# Patient Record
Sex: Female | Born: 1960 | Race: White | Hispanic: No | Marital: Married | Smoking: Never smoker
Health system: Southern US, Community
[De-identification: ages and names within clinical notes are randomized; demographics above are authoritative.]

## PROBLEM LIST (undated history)

## (undated) DIAGNOSIS — G4733 Obstructive sleep apnea (adult) (pediatric): Secondary | ICD-10-CM

## (undated) DIAGNOSIS — F319 Bipolar disorder, unspecified: Secondary | ICD-10-CM

## (undated) DIAGNOSIS — M797 Fibromyalgia: Secondary | ICD-10-CM

## (undated) DIAGNOSIS — T7840XA Allergy, unspecified, initial encounter: Secondary | ICD-10-CM

## (undated) DIAGNOSIS — M26629 Arthralgia of temporomandibular joint, unspecified side: Secondary | ICD-10-CM

## (undated) DIAGNOSIS — C4491 Basal cell carcinoma of skin, unspecified: Secondary | ICD-10-CM

## (undated) DIAGNOSIS — E2749 Other adrenocortical insufficiency: Secondary | ICD-10-CM

## (undated) DIAGNOSIS — M9420 Chondromalacia, unspecified site: Secondary | ICD-10-CM

## (undated) DIAGNOSIS — E039 Hypothyroidism, unspecified: Secondary | ICD-10-CM

## (undated) DIAGNOSIS — K760 Fatty (change of) liver, not elsewhere classified: Secondary | ICD-10-CM

## (undated) HISTORY — DX: Arthralgia of temporomandibular joint, unspecified side: M26.629

## (undated) HISTORY — DX: Basal cell carcinoma of skin, unspecified: C44.91

## (undated) HISTORY — DX: Other adrenocortical insufficiency: E27.49

## (undated) HISTORY — DX: Obstructive sleep apnea (adult) (pediatric): G47.33

## (undated) HISTORY — DX: Fatty (change of) liver, not elsewhere classified: K76.0

## (undated) HISTORY — PX: PLANTAR FASCIA RELEASE: SHX2239

## (undated) HISTORY — DX: Fibromyalgia: M79.7

## (undated) HISTORY — DX: Chondromalacia, unspecified site: M94.20

## (undated) HISTORY — DX: Bipolar disorder, unspecified: F31.9

## (undated) HISTORY — DX: Hypothyroidism, unspecified: E03.9

## (undated) HISTORY — DX: Allergy, unspecified, initial encounter: T78.40XA

---
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
## 1994-02-26 HISTORY — PX: HEMORRHOIDECTOMY WITH HEMORRHOID BANDING: SHX5633

## 1994-02-26 HISTORY — PX: TUBAL LIGATION: SHX77

## 1996-02-27 HISTORY — PX: ABDOMINAL HYSTERECTOMY: SHX81

## 1997-02-26 HISTORY — PX: OTHER SURGICAL HISTORY: SHX169

## 1997-02-26 HISTORY — PX: LIPOSUCTION: SHX10

## 1997-02-26 HISTORY — PX: REDUCTION MAMMAPLASTY: SUR839

## 1997-11-17 ENCOUNTER — Encounter: Admission: RE | Admit: 1997-11-17 | Discharge: 1997-11-17 | Payer: Self-pay | Admitting: *Deleted

## 1998-02-10 ENCOUNTER — Other Ambulatory Visit: Admission: RE | Admit: 1998-02-10 | Discharge: 1998-02-10 | Payer: Self-pay | Admitting: Obstetrics and Gynecology

## 1999-03-09 ENCOUNTER — Encounter: Admission: RE | Admit: 1999-03-09 | Discharge: 1999-03-09 | Payer: Self-pay | Admitting: *Deleted

## 1999-03-09 ENCOUNTER — Other Ambulatory Visit: Admission: RE | Admit: 1999-03-09 | Discharge: 1999-03-09 | Payer: Self-pay | Admitting: *Deleted

## 1999-03-09 ENCOUNTER — Encounter: Payer: Self-pay | Admitting: *Deleted

## 1999-04-03 ENCOUNTER — Encounter: Payer: Self-pay | Admitting: Pulmonary Disease

## 1999-06-16 ENCOUNTER — Encounter: Payer: Self-pay | Admitting: Pulmonary Disease

## 1999-11-09 ENCOUNTER — Ambulatory Visit (HOSPITAL_BASED_OUTPATIENT_CLINIC_OR_DEPARTMENT_OTHER): Admission: RE | Admit: 1999-11-09 | Discharge: 1999-11-09 | Payer: Self-pay | Admitting: Pulmonary Disease

## 2000-03-28 ENCOUNTER — Ambulatory Visit (HOSPITAL_BASED_OUTPATIENT_CLINIC_OR_DEPARTMENT_OTHER): Admission: RE | Admit: 2000-03-28 | Discharge: 2000-03-28 | Payer: Self-pay | Admitting: Pulmonary Disease

## 2000-08-15 ENCOUNTER — Other Ambulatory Visit: Admission: RE | Admit: 2000-08-15 | Discharge: 2000-08-15 | Payer: Self-pay | Admitting: *Deleted

## 2000-08-15 ENCOUNTER — Encounter: Admission: RE | Admit: 2000-08-15 | Discharge: 2000-08-15 | Payer: Self-pay | Admitting: *Deleted

## 2000-08-15 ENCOUNTER — Encounter: Payer: Self-pay | Admitting: *Deleted

## 2001-02-26 HISTORY — PX: OTHER SURGICAL HISTORY: SHX169

## 2001-10-07 ENCOUNTER — Encounter: Admission: RE | Admit: 2001-10-07 | Discharge: 2001-10-07 | Payer: Self-pay | Admitting: *Deleted

## 2001-10-07 ENCOUNTER — Encounter: Payer: Self-pay | Admitting: *Deleted

## 2002-02-02 ENCOUNTER — Other Ambulatory Visit: Admission: RE | Admit: 2002-02-02 | Discharge: 2002-02-02 | Payer: Self-pay | Admitting: Obstetrics and Gynecology

## 2002-06-26 ENCOUNTER — Inpatient Hospital Stay (HOSPITAL_COMMUNITY): Admission: RE | Admit: 2002-06-26 | Discharge: 2002-06-30 | Payer: Self-pay | Admitting: *Deleted

## 2002-06-26 ENCOUNTER — Encounter (INDEPENDENT_AMBULATORY_CARE_PROVIDER_SITE_OTHER): Payer: Self-pay | Admitting: *Deleted

## 2002-08-03 ENCOUNTER — Ambulatory Visit (HOSPITAL_COMMUNITY): Admission: RE | Admit: 2002-08-03 | Discharge: 2002-08-03 | Payer: Self-pay | Admitting: *Deleted

## 2002-10-28 ENCOUNTER — Encounter: Admission: RE | Admit: 2002-10-28 | Discharge: 2002-10-28 | Payer: Self-pay | Admitting: *Deleted

## 2004-08-16 ENCOUNTER — Ambulatory Visit: Payer: Self-pay | Admitting: *Deleted

## 2004-11-07 ENCOUNTER — Ambulatory Visit: Payer: Self-pay | Admitting: Pulmonary Disease

## 2005-08-28 ENCOUNTER — Ambulatory Visit: Payer: Self-pay | Admitting: *Deleted

## 2005-11-23 ENCOUNTER — Ambulatory Visit: Payer: Self-pay | Admitting: Pulmonary Disease

## 2006-01-24 ENCOUNTER — Ambulatory Visit: Payer: Self-pay | Admitting: Unknown Physician Specialty

## 2006-10-02 ENCOUNTER — Ambulatory Visit: Payer: Self-pay | Admitting: *Deleted

## 2006-10-22 ENCOUNTER — Ambulatory Visit: Payer: Self-pay | Admitting: Pulmonary Disease

## 2006-10-31 DIAGNOSIS — IMO0001 Reserved for inherently not codable concepts without codable children: Secondary | ICD-10-CM | POA: Insufficient documentation

## 2006-10-31 DIAGNOSIS — K7689 Other specified diseases of liver: Secondary | ICD-10-CM | POA: Insufficient documentation

## 2006-10-31 DIAGNOSIS — G4710 Hypersomnia, unspecified: Secondary | ICD-10-CM | POA: Insufficient documentation

## 2006-10-31 DIAGNOSIS — M9420 Chondromalacia, unspecified site: Secondary | ICD-10-CM | POA: Insufficient documentation

## 2007-03-30 DIAGNOSIS — D239 Other benign neoplasm of skin, unspecified: Secondary | ICD-10-CM

## 2007-03-30 HISTORY — DX: Other benign neoplasm of skin, unspecified: D23.9

## 2007-08-12 ENCOUNTER — Emergency Department: Payer: Self-pay | Admitting: Emergency Medicine

## 2007-11-20 ENCOUNTER — Ambulatory Visit: Payer: Self-pay | Admitting: Obstetrics and Gynecology

## 2008-03-25 ENCOUNTER — Ambulatory Visit: Payer: Self-pay | Admitting: Pulmonary Disease

## 2008-03-25 DIAGNOSIS — G4733 Obstructive sleep apnea (adult) (pediatric): Secondary | ICD-10-CM | POA: Insufficient documentation

## 2008-05-03 ENCOUNTER — Ambulatory Visit: Payer: Self-pay | Admitting: Pulmonary Disease

## 2008-05-03 DIAGNOSIS — R05 Cough: Secondary | ICD-10-CM

## 2008-05-03 DIAGNOSIS — R059 Cough, unspecified: Secondary | ICD-10-CM | POA: Insufficient documentation

## 2008-05-03 DIAGNOSIS — R062 Wheezing: Secondary | ICD-10-CM | POA: Insufficient documentation

## 2008-05-24 ENCOUNTER — Ambulatory Visit (HOSPITAL_COMMUNITY): Admission: RE | Admit: 2008-05-24 | Discharge: 2008-05-24 | Payer: Self-pay | Admitting: Pulmonary Disease

## 2008-05-24 ENCOUNTER — Encounter: Payer: Self-pay | Admitting: Pulmonary Disease

## 2009-04-11 ENCOUNTER — Ambulatory Visit: Payer: Self-pay | Admitting: Pulmonary Disease

## 2009-07-20 ENCOUNTER — Telehealth: Payer: Self-pay | Admitting: Pulmonary Disease

## 2009-07-21 ENCOUNTER — Ambulatory Visit: Payer: Self-pay | Admitting: Obstetrics and Gynecology

## 2010-03-30 NOTE — Progress Notes (Signed)
Summary: cpap  Phone Note Call from Patient   Caller: Katherine James Call For: Katherine James Summary of Call: her ramp button takes to get from 5-10 needs order to [facility] to unhook her ramp if this is ok Initial call taken by: Oneita Jolly,  Jul 20, 2009 12:00 PM  Follow-up for Phone Call        she can turn it off.  Doesn't have to keep it on.  She can read the book, or we can have dme if she wants [place] to. Follow-up by: Barbaraann Share MD,  Jul 20, 2009 5:18 PM  Additional Follow-up for Phone Call Additional follow up Details #1::        will send order to [facility] to show pt how to trun off ramp Additional Follow-up by: Oneita Jolly,  Jul 21, 2009 8:49 AM

## 2010-03-30 NOTE — Assessment & Plan Note (Signed)
Summary: rov for osa   Copy to:  [facility]  CC:  Pt is here for a 52yr f/u appt.  Pt states she is wearing her cpap machine every night.  Approx 12 hours per night.  Pt denied any complaints with mask or pressure. Marland Kitchen  History of Present Illness: The pt comes in today for f/u of her known osa.  She is wearing cpap compliantly, and is having no issues with mask fit or pressure.  She is sleeping well, and is satisfied with her daytime alertness.  She is also losing weight slowly over time with lifestyle change.  Current Medications (verified): 1)  Ambien 10 Mg Tabs (Zolpidem Tartrate) .... Take 1 Tab By Mouth At Bedtime 2)  Lamictal 200 Mg  Tabs (Lamotrigine) .Marland Kitchen.. 1 1/2 Tab Once Daily 3)  Duragesic-12 12.5 Mcg/hr  Pt72 (Fentanyl) .... Apply Every 3rd  Day 4)  Guafenisin 300 Mg Tabs .... 5 Tabs A Day 5)  Lithobid 300 Mg Cr-Tabs (Lithium Carbonate) .Marland Kitchen.. 1 1/2 Tabs Every Day 6)  Dhea 15-50 Mg Caps (Nutritional Supplements) .... Take 1 Tablet By Mouth Once A Day 7)  Hormonal Cream .... Once Daily 8)  Natues Thyroid 164 .... Take 1 Tablet By Mouth Once A Day 9)  Progesterone 50 Mg (Progesterone) .... Take 1 Tab By Mouth At Bedtime 10)  Melatonin 1 Mg Tabs (Melatonin) .... 1/2 Tab By Mouth At Bedtime 11)  Tylenol Extra Strength 500 Mg Tabs (Acetaminophen) .... Take 1 Tablet By Mouth Four Times A Day As Needed 12)  Roxicodone 5 Mg Tabs (Oxycodone Hcl) .... Take 1/4 Tab By Mouth One To Two Times Daily As Needed 13)  Valium 10 Mg Tabs (Diazepam) .... Take 1/4 Tab By Mouth One To Two Times Daily As Needed  Allergies (verified): 1)  ! * Enteric Coated Asa 2)  Codeine 3)  Tetracycline 4)  Bactrim 5)  Ibuprofen 6)  Naprosyn 7)  Relafen 8)  Clinoril 9)  Voltaren 10)  Toradol 11)  Feldene 12)  Lodine 13)  Seldane 14)  Prozac 15)  Vioxx 16)  Celebrex 17)  Daypro 18)  Doxycycline 19)  Augmentin 20)  * Flonase 21)  Ultram 22)  Xanax 23)  Motrin 24)  Paxil 25)  Thorazine 26)   Levaquin 27)  Demerol  Review of Systems      See HPI  Vital Signs:  Patient profile:   50 year old female Height:      68 inches Weight:      202.25 pounds BMI:     30.86 BP sitting:   120 / 76  (left arm) Cuff size:   regular  Vitals Entered By: Arman Filter LPN (April 11, 2009 1:32 PM)  O2 Flow:  Room air CC: Pt is here for a 45yr f/u appt.  Pt states she is wearing her cpap machine every night.  Approx 12 hours per night.  Pt denied any complaints with mask or pressure.  Comments Medications reviewed with patient Arman Filter LPN  April 11, 2009 1:32 PM    Physical Exam  General:  ow female in nad Nose:  no skin breakdown or pressure necrosis from the cpap mask.   Impression & Recommendations:  Problem # 1:  OBSTRUCTIVE SLEEP APNEA (ICD-327.23)  the pt is doing very well on cpap, and has lost 30 pounds over the last one year.  I have asked her to keep up with cpap supplies, and to continue to work on [employer]  loss.  She will followup again in one year.  Medications Added to Medication List This Visit: 1)  Ambien 10 Mg Tabs (Zolpidem tartrate) .... Take 1 tab by mouth at bedtime 2)  Duragesic-12 12.5 Mcg/hr Pt72 (Fentanyl) .... Apply every 3rd  day 3)  Dhea 15-50 Mg Caps (Nutritional supplements) .... Take 1 tablet by mouth once a day 4)  Hormonal Cream  .... Once daily 5)  Natues Thyroid 164  .... Take 1 tablet by mouth once a day 6)  Progesterone 50 Mg (progesterone)  .... Take 1 tab by mouth at bedtime 7)  Melatonin 1 Mg Tabs (Melatonin) .... 1/2 tab by mouth at bedtime 8)  Tylenol Extra Strength 500 Mg Tabs (Acetaminophen) .... Take 1 tablet by mouth four times a day as needed 9)  Roxicodone 5 Mg Tabs (Oxycodone hcl) .... Take 1/4 tab by mouth one to two times daily as needed 10)  Valium 10 Mg Tabs (Diazepam) .... Take 1/4 tab by mouth one to two times daily as needed  Other Orders: Est. Patient Level II (16109)  Patient Instructions: 1)  Please  schedule a follow-up appointment in 1 year. 2)  continue to work on weight loss   Immunization History:  Influenza Immunization History:    Influenza:  historical (02/27/1996)  Pneumovax Immunization History:    Pneumovax:  historical (02/27/1996)

## 2010-05-09 ENCOUNTER — Ambulatory Visit (INDEPENDENT_AMBULATORY_CARE_PROVIDER_SITE_OTHER): Payer: Medicare Other | Admitting: Pulmonary Disease

## 2010-05-09 ENCOUNTER — Encounter: Payer: Self-pay | Admitting: Pulmonary Disease

## 2010-05-09 DIAGNOSIS — G4733 Obstructive sleep apnea (adult) (pediatric): Secondary | ICD-10-CM

## 2010-05-25 NOTE — Assessment & Plan Note (Signed)
Summary: rov for osa    Copy to:  [facility]  CC:  1 year f/u appt for OSA.  Pt states she wears her cpap machine every night.  "a little less" than 12 hours per night.  Pt states mask causes sore spot on bridge of nose.  Denies any complaints with pressure.  Marland Kitchen  History of Present Illness: the pt comes in today for f/u of her known osa.  She is wearing cpap compliantly, but is overdue for a new mask.  She feels that she is sleeping well, and denies any issues with daytime sleepiness.  She states that she is losing weight, but refuses to weigh today.    Current Medications (verified): 1)  Seroquel 25 Mg Tabs (Quetiapine Fumarate) .... Take 1 Tab By Mouth At Bedtime 2)  Lamictal 200 Mg  Tabs (Lamotrigine) .Marland Kitchen.. 1 1/2 Tab Once Daily 3)  Duragesic-25 25 Mcg/hr Pt72 (Fentanyl) .... Every 3rd Day 4)  Guafenisin 300 Mg Tabs .Marland Kitchen.. 7 Tabs A Day 5)  Lithobid 300 Mg Cr-Tabs (Lithium Carbonate) .Marland Kitchen.. 1 1/2 Tabs Every Day 6)  Dhea 20 2.5mg  .... Take 1 Tablet By Mouth Once A Day 7)  Hormonal Cream .... Once Daily 8)  Natues Thyroid 97.5 Mcg .... Take 1 Tablet By Mouth Once A Day 9)  Progesterone 50 Mg (Progesterone) .... Take 1 Tab By Mouth At Bedtime 10)  Melatonin 1 Mg Tabs (Melatonin) .... Take 1 Tablet By Mouth Once A Day 11)  Roxicodone 5 Mg Tabs (Oxycodone Hcl) .... Take 1/4 Tab By Mouth One To Two Times Daily As Needed 12)  Flexeril 10 Mg Tabs (Cyclobenzaprine Hcl) .... 1/4 Tab By Mouth As Needed  Allergies: 1)  ! * Enteric Coated Asa 2)  ! * Testerone Cream 3)  Codeine 4)  Tetracycline 5)  Bactrim 6)  Ibuprofen 7)  Naprosyn 8)  Relafen 9)  Clinoril 10)  Voltaren 11)  Toradol 12)  Feldene 13)  Lodine 14)  Seldane 15)  Prozac 16)  Vioxx 17)  Celebrex 18)  Daypro 19)  Doxycycline 20)  Augmentin 21)  * Flonase 22)  Ultram 23)  Xanax 24)  Motrin 25)  Paxil 26)  Thorazine 27)  Levaquin 28)  Demerol  Review of Systems       The patient complains of weight change,  tooth/dental problems, nasal congestion/difficulty breathing through nose, anxiety, depression, hand/feet swelling, and joint stiffness or pain.  The patient denies shortness of breath with activity, shortness of breath at rest, productive cough, non-productive cough, coughing up blood, chest pain, irregular heartbeats, acid heartburn, indigestion, loss of appetite, abdominal pain, difficulty swallowing, sore throat, headaches, sneezing, itching, ear ache, rash, change in color of mucus, and fever.    Vital Signs:  Patient profile:   50 year old female O2 Sat:      97 % on Room air Temp:     98.2 degrees F oral Pulse rate:   67 / minute BP sitting:   112 / 70  (left arm) Cuff size:   regular  Vitals Entered By: Arman Filter LPN (May 09, 2010 1:35 PM)  O2 Flow:  Room air CC: 1 year f/u appt for OSA.  Pt states she wears her cpap machine every night.  "a little less" than 12 hours per night.  Pt states mask causes sore spot on bridge of nose.  Denies any complaints with pressure.   Comments pt refuses to weigh.  Medications reviewed with patient Arman Filter  LPN  May 09, 2010 1:40 PM    Physical Exam  General:  ow female in nad Nose:  mild erythema over bridge over nose, but no skin breakdown or pressure necrosis  Extremities:  no edema or cyanosis  Neurologic:  alert and oriented, moves all 4 .    Impression & Recommendations:  Problem # 1:  OBSTRUCTIVE SLEEP APNEA (ICD-327.23) the pt is doing well with cpap, and reports no issues with pressure.  She is getting some mask irritation from an old mask, and will go ahead and replace asap.  She states that she has lost 40 pounds since last visit, but refuses to weigh today.  She feels she is doing well wrt her sleep apnea and daytime alertness.   Medications Added to Medication List This Visit: 1)  Seroquel 25 Mg Tabs (Quetiapine fumarate) .... Take 1 tab by mouth at bedtime 2)  Duragesic-25 25 Mcg/hr Pt72 (Fentanyl) .... Every  3rd day 3)  Guafenisin 300 Mg Tabs  .Marland Kitchen.. 7 tabs a day 4)  Dhea 20 2.5mg   .... Take 1 tablet by mouth once a day 5)  Natues Thyroid 97.5 Mcg  .... Take 1 tablet by mouth once a day 6)  Melatonin 1 Mg Tabs (Melatonin) .... Take 1 tablet by mouth once a day 7)  Flexeril 10 Mg Tabs (Cyclobenzaprine hcl) .... 1/4 tab by mouth as needed  Other Orders: Est. Patient Level III (81191) DME Referral (DME)  Patient Instructions: 1)  will send an order to [facility] for a new mask 2)  continue to work on weight loss, and let me know when you get to your goal weight. 3)  followup with me in one year.

## 2010-06-10 ENCOUNTER — Ambulatory Visit: Payer: Self-pay | Admitting: Internal Medicine

## 2010-07-14 NOTE — Discharge Summary (Signed)
Katherine James, Katherine James                          ACCOUNT NO.:  [id number]   MEDICAL RECORD NO.:  [id number]                   PATIENT TYPE:  INP   LOCATION:  0352                                 FACILITY:  [facility]   PHYSICIAN:  Pershing Cox, M.D.            DATE OF BIRTH:  February 19, 1961   DATE OF ADMISSION:  06/26/2002  DATE OF DISCHARGE:  06/30/2002                                 DISCHARGE SUMMARY   ADMITTING DIAGNOSES:  Right ovarian mass and pelvic pain.   DISCHARGE DIAGNOSES:  1. Right ovarian torsion with rupture.  2. Pelvic phlegmon.  3. Marked postoperative complication with pain.   HISTORY OF PRESENT ILLNESS:  For details of the patient's admission history  and physical, please see the transcribed note which is in the patient's  chart dated June 26, 2002.  Briefly, the patient is 50 years old and  presented to emergency room in [place] with severe right lower quadrant  pain on April 23.  CT scan showed a complex mass.  In our office ultrasound  showed a complex mass measuring 10 x 7 x 9 cm.  This appeared to be from the  right ovary.  Blood flow to this ovary appeared to be normal.  Tumor markers  were negative.  The patient was prepped for surgery and brought to the  operating room for this admission.   HOSPITAL COURSE:  On the day of surgery the patient was taken to the  operating room and under general anesthesia with epidural local the patient  underwent examination under anesthesia, lysis of adhesions, and bilateral  salpingo-oophorectomy.  She had previously had a supracervical hysterectomy.  We found a necrotic right fallopian tube and ovary with evidence of torsion.  There was a marked pelvic phlegmon with exudate covering the left fallopian  tube and ovary which was the reason for removal of this ovary.   The patient's operative procedure was uncomplicated.  She was admitted to  the intensive care unit after surgery for pain management.  It should be  noted  that this patient has severe chronic pain syndrome which appears to be  associated with her diagnosis of fibromyalgia.  She has an epidural patch 75  mg that she wears constantly.  In the preoperative period it was very  difficult to control her pain and because of her need for epidural local  anesthesia, we elected to place her into the intensive care unit.   The decision for intensive care management was a good one.  Her pain was  extremely difficult to control.  In the recovery room they were unable to  distract her and help her to manage her pain despite the documentation that  the local anesthesia was working well.  She was treated with a Dilaudid PCA  and still had very poor control.  I asked for consultation by physical and  rehabilitative medicine and Erick Colace, M.D. saw  her in  consultation.  He suggested that we take out her Foley as this seemed to be  the major concern that she had and treat her with Valium.  He recommended  that she not receive additional Duragesic patch medication.  Later that  evening even after her Foley was removed she was rocking in her bed crying  and writhing in pain.  Lidocaine jelly was applied to her urethra.  A very  small pediatric Foley was inserted and when the patient was distracted her  pain seemed to be much better controlled.  There was some evidence noted by  this physician and by the patient's nursing staff that her pain was  extremely exacerbated in the presence of her family and her husband.  Nevertheless, the patient continued to be a major problem in terms of pain  management.  Consultation with the pharmacist led [place] to begin her on a  Dilaudid drip.  The patient's Dilaudid drip and her epidural local  anesthesia kept her in the intensive care unit over the first 48 hours.   On postoperative day number one patient's major complaint was that her right  leg was numb and it was difficult for her to stand.  Despite this we kept   the epidural in until the following morning because we were very concerned  about her inability to control her pain without it.  That day she had a good  day, was able to tolerate sips of liquid without nausea, and was able to  manage her pain with a Dilaudid drip receiving 3.5 mg/hour of this  medication.  Remember that this is in combination with the lidocaine  epidural and with her 75 mg Duragesic patch.  The evening of May 1 she was  also noted to have brief episode of asystole.  I discussed this with the  cardiologist on-call and because it was brief lasting only 10.5 seconds, he  did not feel that it needed to be worked up.  She had a couple more episodes  of this.  All of these episodes were asymptomatic.   On the morning of postoperative day number two the patient had no nausea and  had passed flatus.  Tmax was 99.1.  Her physical examination was stable.  Her hemoglobin was also stable.  She continued to have low potassium levels  and received potassium runs over that day.  She was started on a regular  diet.  Her Foley catheter was removed.  She had received hydrocortisone  because of a diagnosis of some sort of adrenal insufficiency.  This was  continued on postoperative day number two and she was able to resume her own  adrenal extract which was prescribed by Dr. Elesa Massed in [place].   Postoperative day number three the patient was tolerating a regular diet  without nausea.  She had some abdominal pain but this seemed to be  adequately controlled on Tylox.  If she took two Tylox she was really too  sleepy.  She complained of stabbing pain in her urethra.  Urine was obtained  for UA and C&S.  The UA was normal.  She was offered lidocaine for her  urethra which she declined.  At this time I insisted that she begin on  Percocet one, not two q.4h. because she was very, very sleepy.  She seemed  to tolerate this well.  On postoperative day number four the patient was examined and  was very  sleepy and irritable.  She told  me she is not a morning person.  In  reviewing her data her vital signs were stable.  She has been walking in the  hall.  She has been taking a regular diet.  Her potassium now is 3.7.  Our  plan was to discharge her this morning.  She will be seen in my office on  Friday or Monday to have her staples removed.  Will check her potassium at  that time.  She is given a prescription for Tylox number 50 one p.o. q.4h.   Concerning this patient's ongoing pain syndrome, we have discussed some  options for her and in the postoperative period we will either ask her to be  seen by the rehabilitation medicine people here for help with her pain or  possibly with some biofeedback technology.                                               Pershing Cox, M.D.    MAJ/MEDQ  D:  06/30/2002  T:  06/30/2002  Job:  [id number]

## 2010-07-14 NOTE — Op Note (Signed)
Katherine James, Katherine James                          ACCOUNT NO.:  [id number]   MEDICAL RECORD NO.:  [id number]                   PATIENT TYPE:  INP   LOCATION:  0002                                 FACILITY:  [facility]   PHYSICIAN:  Pershing Cox, M.D.            DATE OF BIRTH:  August 29, 1960   DATE OF PROCEDURE:  06/26/2002  DATE OF DISCHARGE:                                 OPERATIVE REPORT   PREOPERATIVE DIAGNOSES:  Right complex ovarian mass and pelvic pain.   POSTOPERATIVE DIAGNOSES:  Torsion of the right fallopian tube and ovary,  with cyst rupture and pelvic phlegmon.   PROCEDURES:  1. Examination under anesthesia.  2. Lysis of adhesions.  3. Bilateral salpingo-oophorectomy.   ANESTHESIA:  General endotracheal and epidural for local anesthesia  postoperatively.   SURGEON:  Pershing Cox, M.D.   ASSISTANTS:  Lenoard Aden, M.D.  Debbora Dus, C.N.P.   INDICATIONS FOR PROCEDURE:  The patient is 50 years old.  She has a complex  medical history, which includes severe pain syndrome associated with her  fibromyalgia, idiopathic hypersomnia and sleep apnea, migraine headaches.  The patient was well until Friday, June 19, 2002.  She was taken by  ambulance to [facility] for severe and sudden pelvic  pain.  A CT scan showed a complex adnexal mass.  Sonogram was recommended,  and the patient came to our office on Monday morning and had a sonogram.  This showed a complex right adnexal mass.  There was no evidence of acute  abdomen at that time, although her white count was elevated.  The patient  refused hospitalization at that time and did not want to consider surgery.  She was seen the second day, and at that time was counseled for surgery and  did agree.  She was scheduled for surgery this morning.   OPERATIVE FINDINGS:  Exploratory laparotomy was performed.  There was no  evidence of ascites.  There was a complex pelvic phlegmon, carved by a right  ovarian torsion.  The ovarian mass was stuck to the undersurface of the  rectosigmoid, and softly to the cecum, appendix, right pelvic sidewall, left  fallopian tube and ovary, and cul-de-sac.  The surfaces had a large exudate.  Approximately 90% of the exudate was easily removed and submitted with a  specimen.  The ovary and fallopian tube were black.  They could clearly be  seen to be twisted on the blood supply.  The left fallopian tube and ovary  were also covered with exudate.  The left fallopian tube was distended and  swollen and dark.  An attempt was made to remove the fallopian tube, but  this was unsuccessful because it was impossible to get hemostasis from the  mesosalpinx.  Therefore, the left ovary was also removed.   DESCRIPTION OF PROCEDURE:  The patient was brought to the operating room  with an IV in  place.  She was in the holding area and an epidural catheter  had been placed.  She had received 100 mg of hydrocortisone and 1 mg of  Ancef.  Supine on the OR table, IV sedation was administered.  She was then  placed into a frog-leg position and the anterior abdominal wall, perineum  and vagina were prepped with solution of Hibiclens.  A Foley catheter was  gently inserted into the bladder.  Again, in the supine position, the  abdomen was marked for a midline incision; the abdomen had been draped for a  midline incision using sterile drapes.  Marcaine was instilled into the  surface of the skin, beginning approximately 4 cm beneath the umbilicus and  extending to the symphysis.  The Marcaine was placed around the umbilicus,  so that we would know where to go if we found a malignancy.  This incision  was never used during this case.   A scalpel was used to incise the skin, taking this down to the Scarpa's  fascia.  This was divided and the remainder of the dissection was by blunt  dissection, excluding the fascia, which was opened with a knife and exposing  the underlying  rectosigmoid muscles.  These were divided in their midline.  The peritoneum was entered and opened atraumatically.  The suction catheter  was then inserted using a _______  suction, and very little fluid was  extruded.  Peritoneal incision was extended superiorly and inferiorly, and  the anterior abdomen walls were lifted with retractors.  Saline at 500 cc  was instilled and retrieved with peritoneal washings.   The oval retractor was used to retract the abdominal walls.  Using blunt  resection, the ovary was separated from the rectosigmoid and at that point I  was able to remove the other bowel surfaces along the surface of the upper  rectosigmoid.  The ovarian mass could then be lifted and separated from the  rectosigmoid.  At this point it was possible to pack the bowel into the  upper abdomen.   The round ligament was isolated on the patient's right.  It was suture-  ligated and transected going from the anterior into the retroperitoneum.  The peritoneum along the ovarian vessels was incised, and the IP ligament  was lifted medially.  The ureter was visualized and the IC ligament was  doubly clamped, cut, sutured and free-tie ligated.  The posterior peritoneum  was lifted.  There was such an exudate and so much inflammation that just  lifting it caused the ovary to be freed.  The ovary was freed down to its  attachment in the pelvis, which was to a long adhesion coming off the pelvic  sidewall.  This was probably the reason for the patient's torsion.  The  ovary was liver-like in quality.  It was sent to the pathologist and frozen  section showed no evidence of malignancy -- simply torsion.  In inspecting  the remainder of the pelvis, there was marked exudate in the pelvis covering  the cul-de-sac and extending to the patient's left.  The ovary on the left  was involved in this exudate, as was the fallopian tube.  As the exudate was removed, it was clear that the fallopian tube was  markedly distended and  very dark.  A decision was made to try and remove the fallopian tube.  It  was lifted along the mesosalpinx and right-angled clamps were used to try  and separate it from the  ovary.  This was unsuccessful, as each of the ties  that were placed caused more and more bleeding.  A decision was then made to  proceed with a left salpingo-oophorectomy.  The round ligament was  identified, suture ligated and transected.  The peritoneum was lifted.  The  IC ligament was isolated; ureter was palpated.  The IP ligament was doubly  clamped, cut, sutured and free-tie ligated.  The tissue along the base of  the fallopian tube and ovary was so involved that it literally disintegrated  as we went through the fallopian tube and ovary.   The remainder of the pelvis was explored.  Where there was necrotic tissue  it was removed.  The pelvis was vigorously irrigated.  The patient was  positioned to Trendelenburg, so that the fluid from the upper abdomen would  come down.   Moist laps were removed from the upper abdomen where the bowel had been  packed into the upper abdomen.  The surfaces of the transverse colon were  inspected where the ovary had been adherent; there was no evidence of  bleeding.  Omentum was pulled down over the top of the rectosigmoid.  The  fascia was closed with a running stitch of double-stranded Prolene, starting  at the top and the bottom and running the stitch.  The stitch was buried  after tying the two strands together.  The center was looped, and therefore  there were no knots at either end.  The knotted suture was buried in the  fascia, and then the subcutaneous tissues were closed over the knot using a  3-0 Vicryl.  Skin staples were applied.   PATHOLOGY:  The specimens included right and left fallopian tubes and ovary,  portions of peritoneum and phlegmon and exudate.   COMPLICATIONS:  None.   ESTIMATED BLOOD LOSS:  500 cc.   URINE OUTPUT:  450  cc.   FLUIDS:  179 cc Crystalloid.                                                  Pershing Cox, M.D.    MAJ/MEDQ  D:  06/26/2002  T:  06/26/2002  Job:  [id number]   cc:   Lenoard Aden, M.D.  [address], [address]  [place]  [place] [address]  Fax: [phone]

## 2010-07-19 ENCOUNTER — Telehealth: Payer: Self-pay | Admitting: Pulmonary Disease

## 2010-07-19 NOTE — Telephone Encounter (Signed)
Called and spoke with pt.  Pt states her current machine is 49 years old.  Pt is aware of the 1 year warrenty and still wishes to go ahead with ordering a cpap machine online.  Pt requests this rx be faxed to her personal fax # at [phone].  ( no need for a cover sheet) also needs a dx code on the rx.

## 2010-07-19 NOTE — Telephone Encounter (Signed)
Spoke with pt. She states that she was using [employer] for CPAP supplies, but now since ins has changed [employer] not in her network.  She is requesting rx for CPAP so that she can get this online. She states rx needs to state dx also.  Pls advise thanks

## 2010-07-19 NOTE — Telephone Encounter (Signed)
She needs to make sure her machine is at least 49 yrs old.  Insurance will not cover if less than that.  If it is old enough, I can give her a prescription for cpap online.  Make sure she understands though it will only have a one year warranty, and if breaks, becomes a "doorstop"  If she gets thru dme, insurance will usually pay them for maintenance contract if something happens to it.

## 2010-07-19 NOTE — Telephone Encounter (Signed)
done

## 2010-09-14 ENCOUNTER — Telehealth: Payer: Self-pay | Admitting: Pulmonary Disease

## 2010-09-14 NOTE — Telephone Encounter (Signed)
Pt says she is no longer using [employer] as her DME and has found a CPAP mask on-line that she wishes to try.  The website is [email or web address]. We will need to fax a prescription for the CPAP mask and supplies if KC agrees with this. Pls advise.

## 2010-09-14 NOTE — Telephone Encounter (Signed)
PT CALLED BACK. SAYS SHE LEFT AN INCORRECT FAX # FOR "[email or web address]". FAX # IS [phone]. Katherine James

## 2010-09-15 NOTE — Telephone Encounter (Signed)
Pt aware we will fax rx.Jennifer Yancey Flemings, CMA

## 2010-09-15 NOTE — Telephone Encounter (Signed)
Ok with me 

## 2010-09-26 ENCOUNTER — Ambulatory Visit: Payer: Self-pay

## 2010-10-09 ENCOUNTER — Telehealth: Payer: Self-pay | Admitting: *Deleted

## 2010-10-09 NOTE — Telephone Encounter (Signed)
LMOM to inform pt we are still processing order for her request regarding rx fax to [facility], which need her current pressure setting.  Called and spoke with Christoper Allegra (who was her last DME company she used and they have on filed she is currently set at 10cm) Just need to verify with pt the correct setting of her cpap and then we can process this for pt.

## 2010-10-09 NOTE — Telephone Encounter (Signed)
Pt returned [name] call.  Please call back.  Katherine James

## 2010-10-10 NOTE — Telephone Encounter (Signed)
Returning call.  Please call [phone]

## 2010-10-10 NOTE — Telephone Encounter (Signed)
LMOMTCB x 1 

## 2010-10-16 NOTE — Telephone Encounter (Signed)
Pt called back.  Stated she is on a pressure of 10cm on her cpap.  Will inform KC of this so he can sign her rx so we can fax to [facility].

## 2010-10-16 NOTE — Telephone Encounter (Signed)
LMOMTCB

## 2011-03-12 ENCOUNTER — Telehealth: Payer: Self-pay | Admitting: Pulmonary Disease

## 2011-03-12 DIAGNOSIS — G4733 Obstructive sleep apnea (adult) (pediatric): Secondary | ICD-10-CM

## 2011-03-12 NOTE — Telephone Encounter (Signed)
Patient last seen 05-09-2010 and told to follow up in 1 year; [facility] are you okay with giving order to [facility] for CPAP supplies as well as sleep study? Thanks.

## 2011-03-13 NOTE — Telephone Encounter (Signed)
Sleep study faxed to [facility]

## 2011-03-13 NOTE — Telephone Encounter (Signed)
Patient returning call.

## 2011-03-13 NOTE — Telephone Encounter (Signed)
Ok to send script for mask, but she needs to keep appt with me upcoming.

## 2011-03-13 NOTE — Telephone Encounter (Signed)
Order has been placed--LMOMTCB for pt to make aware she needs to keep pending apt

## 2011-03-13 NOTE — Telephone Encounter (Signed)
I spoke with pt and is aware of KC recs. Nothing further was needed. Will also forward to [facility] that per pt [facility] in [place] advised her that they need her sleep study as well. Please advise PCC, thanks

## 2011-04-14 ENCOUNTER — Emergency Department: Payer: Self-pay | Admitting: Emergency Medicine

## 2011-04-14 LAB — URINALYSIS, COMPLETE
Bacteria: NONE SEEN
Bilirubin,UR: NEGATIVE
Blood: NEGATIVE
Glucose,UR: NEGATIVE mg/dL (ref 0–75)
Ketone: NEGATIVE
Nitrite: NEGATIVE
Protein: NEGATIVE
RBC,UR: <1 /HPF (ref 0–5)
Squamous Epithelial: <1

## 2011-04-14 LAB — CBC
HCT: 36.3 % (ref 35.0–47.0)
HGB: 12.3 g/dL (ref 12.0–16.0)
MCHC: 33.9 g/dL (ref 32.0–36.0)
MCV: 94 fL (ref 80–100)
Platelet: 227 10*3/uL (ref 150–440)
RDW: 13 % (ref 11.5–14.5)
WBC: 9.2 10*3/uL (ref 3.6–11.0)

## 2011-04-14 LAB — COMPREHENSIVE METABOLIC PANEL
Albumin: 4.6 g/dL (ref 3.4–5.0)
Alkaline Phosphatase: 90 U/L (ref 50–136)
Anion Gap: 8 (ref 7–16)
Calcium, Total: 9 mg/dL (ref 8.5–10.1)
SGOT(AST): 18 U/L (ref 15–37)
SGPT (ALT): 23 U/L

## 2011-04-14 LAB — LIPASE, BLOOD: Lipase: 92 U/L (ref 73–393)

## 2011-04-27 ENCOUNTER — Ambulatory Visit: Payer: Self-pay | Admitting: Family Medicine

## 2011-05-18 ENCOUNTER — Ambulatory Visit: Payer: Medicare Other | Admitting: Pulmonary Disease

## 2011-06-08 ENCOUNTER — Encounter: Payer: Self-pay | Admitting: Pulmonary Disease

## 2011-06-11 ENCOUNTER — Encounter: Payer: Self-pay | Admitting: Pulmonary Disease

## 2011-06-11 ENCOUNTER — Ambulatory Visit (INDEPENDENT_AMBULATORY_CARE_PROVIDER_SITE_OTHER): Payer: Medicare Other | Admitting: Pulmonary Disease

## 2011-06-11 VITALS — BP 120/74 | HR 68 | Temp 98.4°F | Ht 69.0 in | Wt 199.0 lb

## 2011-06-11 DIAGNOSIS — G4733 Obstructive sleep apnea (adult) (pediatric): Secondary | ICD-10-CM

## 2011-06-11 NOTE — Assessment & Plan Note (Signed)
The patient is doing well from a sleep apnea standpoint on CPAP, but is due for a new CPAP machine.  I've encouraged her to work on weight loss, and to keep up with her mask changes and supplies.  If she is doing well, she will followup with me in one year.

## 2011-06-11 NOTE — Patient Instructions (Signed)
Will get you a new cpap machine thru [facility]. Keep up with mask changes and supplies. Work on weight reduction. followup with me in 1 year if doing well.

## 2011-06-11 NOTE — Progress Notes (Signed)
  Subjective:    Patient ID: Katherine James, female    DOB: 03-Jan-1961, 51 y.o.   MRN: [id number]  HPI The patient comes in today for followup of her known obstructive sleep apnea.  She is wearing CPAP compliantly, and feels that she is still benefiting from the device.  She sleeps well during the night, but still has some daytime sleepiness that she believes is due to her sedating medications.  She is having no mask fit issues, but is overdue for a new CPAP machine.   Review of Systems  Constitutional: Negative.  Negative for fever and unexpected weight change.  HENT: Negative.  Negative for ear pain, nosebleeds, congestion, sore throat, rhinorrhea, sneezing, trouble swallowing, dental problem, postnasal drip and sinus pressure.   Eyes: Negative.  Negative for redness and itching.  Respiratory: Negative.  Negative for cough, chest tightness, shortness of breath and wheezing.   Cardiovascular: Negative.  Negative for palpitations and leg swelling.  Gastrointestinal: Negative.  Negative for nausea and vomiting.  Genitourinary: Negative.  Negative for dysuria.  Musculoskeletal: Negative.  Negative for joint swelling.  Skin: Negative.  Negative for rash.  Neurological: Negative.  Negative for headaches.  Hematological: Negative.  Does not bruise/bleed easily.  Psychiatric/Behavioral: Negative.  Negative for dysphoric mood. The patient is not nervous/anxious.        Objective:   Physical Exam Overweight female in no acute distress Nose without purulence or discharge noted, positive rhinorrhea No skin breakdown or pressure necrosis from the CPAP mask Lower extremities without edema, no cyanosis Alert and oriented, moves all 4 extremities.  Does not appear to be sleepy.       Assessment & Plan:

## 2011-08-22 ENCOUNTER — Ambulatory Visit: Payer: Self-pay | Admitting: Family Medicine

## 2011-08-23 ENCOUNTER — Emergency Department: Payer: Self-pay | Admitting: Emergency Medicine

## 2012-01-10 ENCOUNTER — Encounter: Payer: Self-pay | Admitting: Internal Medicine

## 2012-02-18 ENCOUNTER — Ambulatory Visit (AMBULATORY_SURGERY_CENTER): Payer: Medicare Other | Admitting: *Deleted

## 2012-02-18 VITALS — Ht 68.0 in | Wt 194.0 lb

## 2012-02-18 DIAGNOSIS — Z1211 Encounter for screening for malignant neoplasm of colon: Secondary | ICD-10-CM

## 2012-02-18 MED ORDER — NA SULFATE-K SULFATE-MG SULF 17.5-3.13-1.6 GM/177ML PO SOLN: ORAL | Status: DC

## 2012-03-04 ENCOUNTER — Telehealth: Payer: Self-pay | Admitting: Internal Medicine

## 2012-03-04 NOTE — Telephone Encounter (Signed)
Reviewed updated prep instructions with pt.

## 2012-03-05 ENCOUNTER — Telehealth: Payer: Self-pay | Admitting: Internal Medicine

## 2012-03-06 NOTE — Telephone Encounter (Signed)
No answer. Will try later

## 2012-03-06 NOTE — Telephone Encounter (Signed)
No anwer

## 2012-03-07 ENCOUNTER — Encounter: Payer: Medicare Other | Admitting: Internal Medicine

## 2012-03-07 ENCOUNTER — Telehealth: Payer: Self-pay | Admitting: Internal Medicine

## 2012-03-07 NOTE — Telephone Encounter (Signed)
Pt will be finished with her antibiotics before procedure and advised as long as she doesn't have fever she was fine.

## 2012-03-07 NOTE — Telephone Encounter (Signed)
Spoke with Mr. Katherine James. Ms Katherine James was asleep. I was instructed to call back about noon.  LM to let Ms. Katherine James know we have been trying to return her call and I will call at noon today.

## 2012-03-07 NOTE — Telephone Encounter (Signed)
Still no answer left message on answering machine if pt still needed to speak with someone to call us back.

## 2012-03-14 ENCOUNTER — Ambulatory Visit (AMBULATORY_SURGERY_CENTER): Payer: Medicare Other | Admitting: Internal Medicine

## 2012-03-14 ENCOUNTER — Encounter: Payer: Self-pay | Admitting: Internal Medicine

## 2012-03-14 VITALS — BP 116/81 | HR 51 | Temp 97.8°F | Resp 22 | Ht 68.0 in | Wt 194.0 lb

## 2012-03-14 DIAGNOSIS — Z1211 Encounter for screening for malignant neoplasm of colon: Secondary | ICD-10-CM

## 2012-03-14 DIAGNOSIS — K5120 Ulcerative (chronic) proctitis without complications: Secondary | ICD-10-CM

## 2012-03-14 DIAGNOSIS — D126 Benign neoplasm of colon, unspecified: Secondary | ICD-10-CM

## 2012-03-14 MED ORDER — FLEET ENEMA 7-19 GM/118ML RE ENEM
1.0000 | ENEMA | Freq: Once | RECTAL | Status: AC
  Administered 2012-03-14: 1 via RECTAL

## 2012-03-14 MED ORDER — SODIUM CHLORIDE 0.9 % IV SOLN: 500.0000 mL | INTRAVENOUS | Status: DC

## 2012-03-14 NOTE — Progress Notes (Signed)
Patient did not experience any of the following events: a burn prior to discharge; a fall within the facility; wrong site/side/patient/procedure/implant event; or a hospital transfer or hospital admission upon discharge from the facility. (G8907) Patient did not have preoperative order for IV antibiotic SSI prophylaxis. (G8918)  

## 2012-03-14 NOTE — Patient Instructions (Addendum)
YOU HAD AN ENDOSCOPIC PROCEDURE TODAY AT THE [facility]: Refer to the procedure report that was given to you for any specific questions about what was found during the examination.  If the procedure report does not answer your questions, please call your gastroenterologist to clarify.  If you requested that your care partner not be given the details of your procedure findings, then the procedure report has been included in a sealed envelope for you to review at your convenience later.  YOU SHOULD EXPECT: Some feelings of bloating in the abdomen. Passage of more gas than usual.  Walking can help get rid of the air that was put into your GI tract during the procedure and reduce the bloating. If you had a lower endoscopy (such as a colonoscopy or flexible sigmoidoscopy) you may notice spotting of blood in your stool or on the toilet paper. If you underwent a bowel prep for your procedure, then you may not have a normal bowel movement for a few days.  DIET: Your first meal following the procedure should be a light meal and then it is ok to progress to your normal diet.  A half-sandwich or bowl of soup is an example of a good first meal.  Heavy or fried foods are harder to digest and may make you feel nauseous or bloated.  Likewise meals heavy in dairy and vegetables can cause extra gas to form and this can also increase the bloating.  Drink plenty of fluids but you should avoid alcoholic beverages for 24 hours.  ACTIVITY: Your care partner should take you home directly after the procedure.  You should plan to take it easy, moving slowly for the rest of the day.  You can resume normal activity the day after the procedure however you should NOT DRIVE or use heavy machinery for 24 hours (because of the sedation medicines used during the test).    SYMPTOMS TO REPORT IMMEDIATELY: A gastroenterologist can be reached at any hour.  During normal business hours, 8:30 AM to 5:00 PM Monday through Friday,  call [phone].  After hours and on weekends, please call the GI answering service at [phone] who will take a message and have the physician on call contact you.   Following lower endoscopy (colonoscopy or flexible sigmoidoscopy):  Excessive amounts of blood in the stool  Significant tenderness or worsening of abdominal pains  Swelling of the abdomen that is new, acute  Fever of 100F or higher    FOLLOW UP: If any biopsies were taken you will be contacted by phone or by letter within the next 1-3 weeks.  Call your gastroenterologist if you have not heard about the biopsies in 3 weeks.  Our staff will call the home number listed on your records the next business day following your procedure to check on you and address any questions or concerns that you may have at that time regarding the information given to you following your procedure. This is a courtesy call and so if there is no answer at the home number and we have not heard from you through the emergency physician on call, we will assume that you have returned to your regular daily activities without incident.  SIGNATURES/CONFIDENTIALITY: You and/or your care partner have signed paperwork which will be entered into your electronic medical record.  These signatures attest to the fact that that the information above on your After Visit Summary has been reviewed and is understood.  Full responsibility of the confidentiality   of this discharge information lies with you and/or your care-partner.   Diverticulosis and high fiber diet information given.  Repeat exam in 5 years-2019

## 2012-03-14 NOTE — Op Note (Signed)
[facility] [address] [place], [address]   COLONOSCOPY PROCEDURE REPORT  PATIENT: [name], [name]  MR#: [id number] BIRTHDATE: 09-25-1960 , 51  yrs. old GENDER: Female ENDOSCOPIST: Hart Carwin, MD REFERRED BY:  Dr Trinna Post. [place] PROCEDURE DATE:  03/14/2012 PROCEDURE:   Colonoscopy with biopsy ASA CLASS:   Class III INDICATIONS:Average risk patient for colon cancer. MEDICATIONS: MAC sedation, administered by CRNA and Propofol (Diprivan) 340 mg IV  DESCRIPTION OF PROCEDURE:   After the risks and benefits and of the procedure were explained, informed consent was obtained.  A digital rectal exam revealed no abnormalities of the rectum.    The LB PCF-[id number]  endoscope was introduced through the anus and advanced to the cecum, which was identified by both the appendix and ileocecal valve .  The quality of the prep was Moviprep fair .There was non specific proctitis from0-5 cm, mucosa appeared frable and edematous, biopsies were taken to r/o Ulcerative proctitis  The instrument was then slowly withdrawn as the colon was fully examined.     COLON FINDINGS: Mild diverticulosis was noted.     Retroflexed views revealed no abnormalities.     The scope was then withdrawn from the patient and the procedure completed.  COMPLICATIONS: There were no complications. ENDOSCOPIC IMPRESSION: Mild diverticulosis was noted nonspecific proctitis 0-5 cm, s/p biopsies suboptimal prep  RECOMMENDATIONS: Await biopsy results   REPEAT EXAM: In 5 year(s)  for Colonoscopy.  cc:  _______________________________ eSignedHart Carwin, MD 03/14/2012 10:56 AM     PATIENT NAME:  Katherine James, Katherine James MR#: [id number]

## 2012-03-14 NOTE — Progress Notes (Signed)
1045 a/ox3 pleased with MAC report to [employer]

## 2012-03-14 NOTE — Progress Notes (Signed)
Called to room to assist during endoscopic procedure.  Patient ID and intended procedure confirmed with present staff. Received instructions for my participation in the procedure from the performing physician.  

## 2012-03-14 NOTE — Progress Notes (Addendum)
Pt reports that her prep did not work last night. She has been able to go today after second bottle.  Gave herself an enema at home as well and states that it is tea colored liquid coming out.  Per Dr Juanda Chance we are to give her an additional enema in admitting.  Results were a yellow liquid.

## 2012-03-17 ENCOUNTER — Telehealth: Payer: Self-pay

## 2012-03-17 NOTE — Telephone Encounter (Signed)
Left a message at # [phone] for the pt to call if she has any questions or concerns. Maw

## 2012-03-19 ENCOUNTER — Encounter: Payer: Self-pay | Admitting: Internal Medicine

## 2012-05-29 ENCOUNTER — Ambulatory Visit: Payer: Self-pay

## 2012-08-06 ENCOUNTER — Ambulatory Visit: Payer: Medicare Other | Admitting: Pulmonary Disease

## 2012-08-20 ENCOUNTER — Encounter: Payer: Self-pay | Admitting: Pulmonary Disease

## 2012-08-20 ENCOUNTER — Ambulatory Visit (INDEPENDENT_AMBULATORY_CARE_PROVIDER_SITE_OTHER): Payer: Medicare Other | Admitting: Pulmonary Disease

## 2012-08-20 VITALS — BP 108/68 | HR 64 | Temp 97.9°F | Ht 68.0 in | Wt 193.0 lb

## 2012-08-20 DIAGNOSIS — G4733 Obstructive sleep apnea (adult) (pediatric): Secondary | ICD-10-CM

## 2012-08-20 NOTE — Progress Notes (Signed)
  Subjective:    Patient ID: Katherine James, female    DOB: 1960/04/26, 52 y.o.   MRN: [id number]  HPI Patient comes in today for followup of her obstructive sleep apnea.  She is wearing CPAP compliantly, and feels that it continues to help her sleep and daytime alertness.  She continues to have sleep disruption because of chronic pain, and has sleepiness during the day because of her pain medication.   Review of Systems  Constitutional: Negative for fever and unexpected weight change.  HENT: Positive for rhinorrhea. Negative for ear pain, nosebleeds, congestion, sore throat, sneezing, trouble swallowing, dental problem, postnasal drip and sinus pressure.   Eyes: Negative for redness and itching.  Respiratory: Negative for cough, chest tightness, shortness of breath and wheezing.   Cardiovascular: Negative for palpitations and leg swelling.  Gastrointestinal: Negative for nausea and vomiting.  Genitourinary: Negative for dysuria.  Musculoskeletal: Negative for joint swelling.  Skin: Negative for rash.  Neurological: Negative for headaches.  Hematological: Does not bruise/bleed easily.  Psychiatric/Behavioral: Negative for dysphoric mood. The patient is not nervous/anxious.        Objective:   Physical Exam Well-developed female in no acute distress Nose without purulence or discharge noted Neck without lymphadenopathy or thyromegaly No skin breakdown or pressure necrosis from the CPAP mask Lower extremities without edema, cyanosis Alert and oriented, moves all 4 extremities.       Assessment & Plan:

## 2012-08-20 NOTE — Assessment & Plan Note (Signed)
The patient is doing well on CPAP, but continues to have sleeping issues because of her chronic pain syndrome.  She is satisfied with how things are going on CPAP, but continues to look at different mask to find one that is the most comfortable for her.  I've asked her to keep up with her mask changes and supplies, and to followup with [place] in one year.

## 2012-08-20 NOTE — Patient Instructions (Addendum)
Continue with cpap, and keep up with mask changes and supplies. followup with me in one year.

## 2012-10-03 ENCOUNTER — Telehealth: Payer: Self-pay | Admitting: Pulmonary Disease

## 2012-10-03 NOTE — Telephone Encounter (Signed)
Spoke with pt I have already written rx for mask and supplies and stamped with KC's sig with today's date Rx was mailed to the pt Nothing further needed per pt

## 2013-05-20 ENCOUNTER — Ambulatory Visit: Payer: Self-pay | Admitting: Family Medicine

## 2013-07-22 ENCOUNTER — Ambulatory Visit: Payer: Self-pay | Admitting: Obstetrics and Gynecology

## 2013-08-19 ENCOUNTER — Ambulatory Visit (INDEPENDENT_AMBULATORY_CARE_PROVIDER_SITE_OTHER): Payer: Medicare Other | Admitting: Pulmonary Disease

## 2013-08-19 ENCOUNTER — Encounter: Payer: Self-pay | Admitting: Pulmonary Disease

## 2013-08-19 VITALS — BP 114/60 | HR 57 | Temp 97.1°F | Ht 68.0 in | Wt 196.0 lb

## 2013-08-19 DIAGNOSIS — G4733 Obstructive sleep apnea (adult) (pediatric): Secondary | ICD-10-CM

## 2013-08-19 NOTE — Progress Notes (Signed)
   Subjective:    Patient ID: Katherine James, female    DOB: Sep 16, 1960, 53 y.o.   MRN: [id number]  HPI Patient comes in today for followup of her obstructive sleep apnea. She is wearing CPAP compliantly by her history, and feels that she sleeps well with the device. She still has daytime alertness issues related to her multiple sedating medications. Her weight is stable from the last visit.   Review of Systems  Constitutional: Negative for fever and unexpected weight change.  HENT: Negative for congestion, dental problem, ear pain, nosebleeds, postnasal drip, rhinorrhea, sinus pressure, sneezing, sore throat and trouble swallowing.   Eyes: Negative for redness and itching.  Respiratory: Negative for cough, chest tightness, shortness of breath and wheezing.   Cardiovascular: Negative for palpitations and leg swelling.  Gastrointestinal: Negative for nausea and vomiting.  Genitourinary: Negative for dysuria.  Musculoskeletal: Negative for joint swelling.  Skin: Negative for rash.  Neurological: Negative for headaches.  Hematological: Does not bruise/bleed easily.  Psychiatric/Behavioral: Negative for dysphoric mood. The patient is not nervous/anxious.        Objective:   Physical Exam Overweight female in no acute distress Nose without purulence or discharge noted No skin breakdown or pressure necrosis from the CPAP mask Neck without lymphadenopathy or thyromegaly Lower extremities with mild edema, no cyanosis Alert and oriented, moves all 4 extremities       Assessment & Plan:

## 2013-08-19 NOTE — Assessment & Plan Note (Signed)
The patient feels that she is doing fairly well with CPAP, and is satisfied with her response to therapy. She has some persistent daytime sleepiness because of medications, but overall she feels that she is fairly functional. I've asked her to keep up with her mask changes and supplies, and to keep working on weight loss. She is also to bring in her card from her machine so that we can check a download

## 2013-08-19 NOTE — Patient Instructions (Signed)
Continue on cpap, and keep up with mask changes and supplies. Keep working on [employer] loss Bring your card by the office one day so that we can download the data to make sure your sleep apnea is adequately controlled. followup with me again in one year.

## 2013-08-24 ENCOUNTER — Telehealth: Payer: Self-pay | Admitting: Pulmonary Disease

## 2013-08-24 NOTE — Telephone Encounter (Signed)
Pt returning call cn be reached @ [phone].Katherine James

## 2013-08-24 NOTE — Telephone Encounter (Signed)
[place] reviewed the pt's CPAP DL  Per [facility]- download looks great! Good compliance, good control of sleep apnea, a little more mask leak than should  LMTCB for the pt

## 2013-08-24 NOTE — Telephone Encounter (Signed)
I spoke with patient about results and she verbalized understanding and had no questions 

## 2013-11-06 ENCOUNTER — Ambulatory Visit: Payer: Self-pay | Admitting: Family Medicine

## 2014-03-21 ENCOUNTER — Emergency Department: Payer: Self-pay | Admitting: Emergency Medicine

## 2014-06-22 ENCOUNTER — Ambulatory Visit
Admit: 2014-06-22 | Disposition: A | Discharge status: Home / Self Care | Payer: Self-pay | Attending: Family Medicine | Admitting: Family Medicine

## 2014-08-04 ENCOUNTER — Ambulatory Visit
Admission: RE | Admit: 2014-08-04 | Discharge: 2014-08-04 | Disposition: A | Discharge status: Home / Self Care | Payer: PPO | Source: Ambulatory Visit | Attending: Family Medicine | Admitting: Family Medicine

## 2014-08-04 ENCOUNTER — Other Ambulatory Visit: Payer: Self-pay | Admitting: Family Medicine

## 2014-08-04 DIAGNOSIS — H61899 Other specified disorders of external ear, unspecified ear: Secondary | ICD-10-CM

## 2014-08-05 ENCOUNTER — Encounter: Payer: Self-pay | Admitting: Pulmonary Disease

## 2014-08-05 ENCOUNTER — Ambulatory Visit (INDEPENDENT_AMBULATORY_CARE_PROVIDER_SITE_OTHER): Payer: PPO | Admitting: Pulmonary Disease

## 2014-08-05 VITALS — BP 110/68 | HR 54 | Temp 97.7°F

## 2014-08-05 DIAGNOSIS — G4733 Obstructive sleep apnea (adult) (pediatric): Secondary | ICD-10-CM | POA: Diagnosis not present

## 2014-08-05 NOTE — Progress Notes (Signed)
   Subjective:    Patient ID: Katherine James, female    DOB: 1961-02-07, 54 y.o.   MRN: [id number]  HPI Patient comes in today for follow-up of her obstructive sleep apnea. She tells me that she is wearing C Pap compliantly, but continues to have issues with mask fit. She feels it continues to help her, and she will continue wearing the device. She is in need of new supplies and mask cushions.   Review of Systems  Constitutional: Negative for fever and unexpected weight change.  HENT: Negative for congestion, dental problem, ear pain, nosebleeds, postnasal drip, rhinorrhea, sinus pressure, sneezing, sore throat and trouble swallowing.   Eyes: Negative for redness and itching.  Respiratory: Negative for cough, chest tightness, shortness of breath and wheezing.   Cardiovascular: Negative for palpitations and leg swelling.  Gastrointestinal: Negative for nausea and vomiting.  Genitourinary: Negative for dysuria.  Musculoskeletal: Negative for joint swelling.  Skin: Negative for rash.  Neurological: Negative for headaches.  Hematological: Does not bruise/bleed easily.  Psychiatric/Behavioral: Negative for dysphoric mood. The patient is not nervous/anxious.        Objective:   Physical Exam Overweight female in no acute distress Nose without purulence or discharge noted No skin breakdown or pressure necrosis from the C Pap mask Lower extremities without edema, no cyanosis Alert and oriented, does not appear to be sleepy, moves all 4 extremities.       Assessment & Plan:

## 2014-08-05 NOTE — Patient Instructions (Signed)
Continue on cpap, and will give you prescription for new mask and supplies. Will send a referral to the sleep center for a mask fitting session at your convenience.  Will need to be scheduled once you are ready. Keep working on weight loss followup with Dr. Halford Chessman in one year, but call if having issues.

## 2014-08-05 NOTE — Assessment & Plan Note (Signed)
The patient feels that she has done well overall with her CPAP device, but continues to have issues with mask fitting. She is willing to go to the sleep Center for a formal fitting session at her convenience. I have asked her to continue on her sleep apnea, and to work aggressively on weight loss.

## 2014-08-23 ENCOUNTER — Other Ambulatory Visit: Payer: Self-pay | Admitting: Obstetrics and Gynecology

## 2014-08-23 DIAGNOSIS — Z1231 Encounter for screening mammogram for malignant neoplasm of breast: Secondary | ICD-10-CM

## 2014-09-13 ENCOUNTER — Ambulatory Visit
Admission: RE | Admit: 2014-09-13 | Discharge: 2014-09-13 | Disposition: A | Discharge status: Home / Self Care | Payer: PPO | Source: Ambulatory Visit | Attending: Obstetrics and Gynecology | Admitting: Obstetrics and Gynecology

## 2014-09-13 DIAGNOSIS — Z1231 Encounter for screening mammogram for malignant neoplasm of breast: Secondary | ICD-10-CM

## 2014-11-16 ENCOUNTER — Telehealth: Payer: Self-pay | Admitting: Pulmonary Disease

## 2014-11-16 DIAGNOSIS — G4733 Obstructive sleep apnea (adult) (pediatric): Secondary | ICD-10-CM

## 2014-11-16 NOTE — Telephone Encounter (Signed)
Pt last seen by [facility] 08/05/14. Has recall reminder in for VS for 07/2015. Called spoke with Melissa. Pt sent CPAP through repair process. Pt wants to see if insurance will purchase new unit.  Melissa is requesting new order for CPAP replacement w/ settings.  Please advise Dr. Halford Chessman thanks

## 2014-11-16 NOTE — Telephone Encounter (Addendum)
Called and informed Katherine James of the new CPAP order. Order placed. Katherine James verbalized understanding and denied any further questions or concerns at this time.

## 2014-11-16 NOTE — Telephone Encounter (Signed)
Please send order for new auto CPAP with pressure range of 5 to 15 cm H2O with heated humidity.

## 2014-12-02 ENCOUNTER — Other Ambulatory Visit: Payer: Self-pay | Admitting: Family Medicine

## 2014-12-02 DIAGNOSIS — E041 Nontoxic single thyroid nodule: Secondary | ICD-10-CM

## 2014-12-13 ENCOUNTER — Ambulatory Visit: Payer: PPO

## 2014-12-16 ENCOUNTER — Ambulatory Visit: Payer: PPO

## 2014-12-22 ENCOUNTER — Ambulatory Visit: Payer: PPO

## 2014-12-30 ENCOUNTER — Ambulatory Visit
Admission: RE | Admit: 2014-12-30 | Discharge: 2014-12-30 | Disposition: A | Discharge status: Home / Self Care | Payer: PPO | Source: Ambulatory Visit | Attending: Family Medicine | Admitting: Family Medicine

## 2014-12-30 DIAGNOSIS — E041 Nontoxic single thyroid nodule: Secondary | ICD-10-CM

## 2015-03-09 DIAGNOSIS — M797 Fibromyalgia: Secondary | ICD-10-CM | POA: Diagnosis not present

## 2015-03-09 DIAGNOSIS — G4763 Sleep related bruxism: Secondary | ICD-10-CM | POA: Diagnosis not present

## 2015-03-09 DIAGNOSIS — M6248 Contracture of muscle, other site: Secondary | ICD-10-CM | POA: Diagnosis not present

## 2015-03-09 DIAGNOSIS — M791 Myalgia: Secondary | ICD-10-CM | POA: Diagnosis not present

## 2015-03-09 DIAGNOSIS — M9901 Segmental and somatic dysfunction of cervical region: Secondary | ICD-10-CM | POA: Diagnosis not present

## 2015-03-09 DIAGNOSIS — M2663 Articular disc disorder of temporomandibular joint: Secondary | ICD-10-CM | POA: Diagnosis not present

## 2015-03-16 DIAGNOSIS — J342 Deviated nasal septum: Secondary | ICD-10-CM | POA: Diagnosis not present

## 2015-03-16 DIAGNOSIS — R0981 Nasal congestion: Secondary | ICD-10-CM | POA: Diagnosis not present

## 2015-03-24 DIAGNOSIS — R5381 Other malaise: Secondary | ICD-10-CM | POA: Diagnosis not present

## 2015-03-24 DIAGNOSIS — E782 Mixed hyperlipidemia: Secondary | ICD-10-CM | POA: Diagnosis not present

## 2015-03-24 DIAGNOSIS — N951 Menopausal and female climacteric states: Secondary | ICD-10-CM | POA: Diagnosis not present

## 2015-03-24 DIAGNOSIS — E559 Vitamin D deficiency, unspecified: Secondary | ICD-10-CM | POA: Diagnosis not present

## 2015-03-24 DIAGNOSIS — E039 Hypothyroidism, unspecified: Secondary | ICD-10-CM | POA: Diagnosis not present

## 2015-03-24 DIAGNOSIS — M609 Myositis, unspecified: Secondary | ICD-10-CM | POA: Diagnosis not present

## 2015-03-24 DIAGNOSIS — F3181 Bipolar II disorder: Secondary | ICD-10-CM | POA: Diagnosis not present

## 2015-04-14 DIAGNOSIS — B349 Viral infection, unspecified: Secondary | ICD-10-CM | POA: Diagnosis not present

## 2015-04-14 DIAGNOSIS — M791 Myalgia: Secondary | ICD-10-CM | POA: Diagnosis not present

## 2015-05-06 DIAGNOSIS — Z882 Allergy status to sulfonamides status: Secondary | ICD-10-CM | POA: Diagnosis not present

## 2015-05-06 DIAGNOSIS — Z79891 Long term (current) use of opiate analgesic: Secondary | ICD-10-CM | POA: Diagnosis not present

## 2015-05-06 DIAGNOSIS — R1031 Right lower quadrant pain: Secondary | ICD-10-CM | POA: Diagnosis not present

## 2015-05-06 DIAGNOSIS — Z79899 Other long term (current) drug therapy: Secondary | ICD-10-CM | POA: Diagnosis not present

## 2015-05-06 DIAGNOSIS — Z881 Allergy status to other antibiotic agents status: Secondary | ICD-10-CM | POA: Diagnosis not present

## 2015-05-06 DIAGNOSIS — R102 Pelvic and perineal pain: Secondary | ICD-10-CM | POA: Diagnosis not present

## 2015-05-06 DIAGNOSIS — Z887 Allergy status to serum and vaccine status: Secondary | ICD-10-CM | POA: Diagnosis not present

## 2015-05-06 DIAGNOSIS — Z885 Allergy status to narcotic agent status: Secondary | ICD-10-CM | POA: Diagnosis not present

## 2015-05-06 DIAGNOSIS — K5732 Diverticulitis of large intestine without perforation or abscess without bleeding: Secondary | ICD-10-CM | POA: Diagnosis not present

## 2015-05-06 DIAGNOSIS — K429 Umbilical hernia without obstruction or gangrene: Secondary | ICD-10-CM | POA: Diagnosis not present

## 2015-05-07 DIAGNOSIS — K5732 Diverticulitis of large intestine without perforation or abscess without bleeding: Secondary | ICD-10-CM | POA: Diagnosis not present

## 2015-05-07 DIAGNOSIS — E039 Hypothyroidism, unspecified: Secondary | ICD-10-CM | POA: Diagnosis not present

## 2015-05-07 DIAGNOSIS — K429 Umbilical hernia without obstruction or gangrene: Secondary | ICD-10-CM | POA: Diagnosis not present

## 2015-05-07 DIAGNOSIS — F1190 Opioid use, unspecified, uncomplicated: Secondary | ICD-10-CM | POA: Diagnosis not present

## 2015-05-07 DIAGNOSIS — K769 Liver disease, unspecified: Secondary | ICD-10-CM | POA: Diagnosis not present

## 2015-05-07 DIAGNOSIS — G894 Chronic pain syndrome: Secondary | ICD-10-CM | POA: Diagnosis not present

## 2015-05-07 DIAGNOSIS — R140 Abdominal distension (gaseous): Secondary | ICD-10-CM | POA: Diagnosis not present

## 2015-05-07 DIAGNOSIS — R5382 Chronic fatigue, unspecified: Secondary | ICD-10-CM | POA: Diagnosis not present

## 2015-05-07 DIAGNOSIS — M9420 Chondromalacia, unspecified site: Secondary | ICD-10-CM | POA: Diagnosis not present

## 2015-05-07 DIAGNOSIS — M797 Fibromyalgia: Secondary | ICD-10-CM | POA: Diagnosis not present

## 2015-05-07 DIAGNOSIS — F319 Bipolar disorder, unspecified: Secondary | ICD-10-CM | POA: Diagnosis not present

## 2015-05-07 DIAGNOSIS — R1032 Left lower quadrant pain: Secondary | ICD-10-CM | POA: Diagnosis not present

## 2015-05-07 DIAGNOSIS — R935 Abnormal findings on diagnostic imaging of other abdominal regions, including retroperitoneum: Secondary | ICD-10-CM | POA: Diagnosis not present

## 2015-05-07 DIAGNOSIS — Z79899 Other long term (current) drug therapy: Secondary | ICD-10-CM | POA: Diagnosis not present

## 2015-05-07 DIAGNOSIS — K760 Fatty (change of) liver, not elsewhere classified: Secondary | ICD-10-CM | POA: Diagnosis not present

## 2015-05-07 DIAGNOSIS — G4733 Obstructive sleep apnea (adult) (pediatric): Secondary | ICD-10-CM | POA: Diagnosis not present

## 2015-05-07 DIAGNOSIS — K5901 Slow transit constipation: Secondary | ICD-10-CM | POA: Diagnosis not present

## 2015-05-08 DIAGNOSIS — E039 Hypothyroidism, unspecified: Secondary | ICD-10-CM | POA: Diagnosis not present

## 2015-05-08 DIAGNOSIS — G4733 Obstructive sleep apnea (adult) (pediatric): Secondary | ICD-10-CM | POA: Diagnosis not present

## 2015-05-08 DIAGNOSIS — G8929 Other chronic pain: Secondary | ICD-10-CM | POA: Diagnosis not present

## 2015-05-08 DIAGNOSIS — K5732 Diverticulitis of large intestine without perforation or abscess without bleeding: Secondary | ICD-10-CM | POA: Diagnosis not present

## 2015-05-10 DIAGNOSIS — G4733 Obstructive sleep apnea (adult) (pediatric): Secondary | ICD-10-CM | POA: Diagnosis not present

## 2015-05-10 DIAGNOSIS — R109 Unspecified abdominal pain: Secondary | ICD-10-CM | POA: Diagnosis not present

## 2015-05-10 DIAGNOSIS — K5732 Diverticulitis of large intestine without perforation or abscess without bleeding: Secondary | ICD-10-CM | POA: Diagnosis not present

## 2015-05-10 DIAGNOSIS — K7689 Other specified diseases of liver: Secondary | ICD-10-CM | POA: Diagnosis not present

## 2015-05-10 DIAGNOSIS — G8929 Other chronic pain: Secondary | ICD-10-CM | POA: Diagnosis not present

## 2015-05-12 DIAGNOSIS — G894 Chronic pain syndrome: Secondary | ICD-10-CM | POA: Diagnosis not present

## 2015-05-12 DIAGNOSIS — K760 Fatty (change of) liver, not elsewhere classified: Secondary | ICD-10-CM | POA: Diagnosis not present

## 2015-05-12 DIAGNOSIS — R638 Other symptoms and signs concerning food and fluid intake: Secondary | ICD-10-CM | POA: Diagnosis not present

## 2015-05-12 DIAGNOSIS — K5900 Constipation, unspecified: Secondary | ICD-10-CM | POA: Diagnosis not present

## 2015-05-12 DIAGNOSIS — T40601A Poisoning by unspecified narcotics, accidental (unintentional), initial encounter: Secondary | ICD-10-CM | POA: Insufficient documentation

## 2015-05-12 DIAGNOSIS — R1032 Left lower quadrant pain: Secondary | ICD-10-CM | POA: Diagnosis not present

## 2015-05-12 DIAGNOSIS — T368X5D Adverse effect of other systemic antibiotics, subsequent encounter: Secondary | ICD-10-CM | POA: Diagnosis not present

## 2015-05-12 DIAGNOSIS — Z79891 Long term (current) use of opiate analgesic: Secondary | ICD-10-CM | POA: Diagnosis not present

## 2015-05-12 DIAGNOSIS — M9420 Chondromalacia, unspecified site: Secondary | ICD-10-CM | POA: Diagnosis not present

## 2015-05-12 DIAGNOSIS — M722 Plantar fascial fibromatosis: Secondary | ICD-10-CM | POA: Diagnosis not present

## 2015-05-12 DIAGNOSIS — M797 Fibromyalgia: Secondary | ICD-10-CM | POA: Diagnosis not present

## 2015-05-12 DIAGNOSIS — E039 Hypothyroidism, unspecified: Secondary | ICD-10-CM | POA: Diagnosis not present

## 2015-05-12 DIAGNOSIS — R109 Unspecified abdominal pain: Secondary | ICD-10-CM | POA: Diagnosis not present

## 2015-05-12 DIAGNOSIS — R51 Headache: Secondary | ICD-10-CM | POA: Diagnosis not present

## 2015-05-12 DIAGNOSIS — R1031 Right lower quadrant pain: Secondary | ICD-10-CM | POA: Diagnosis not present

## 2015-05-12 DIAGNOSIS — R5382 Chronic fatigue, unspecified: Secondary | ICD-10-CM | POA: Diagnosis not present

## 2015-05-12 DIAGNOSIS — F319 Bipolar disorder, unspecified: Secondary | ICD-10-CM | POA: Diagnosis not present

## 2015-05-12 DIAGNOSIS — Z885 Allergy status to narcotic agent status: Secondary | ICD-10-CM | POA: Diagnosis not present

## 2015-05-12 DIAGNOSIS — R42 Dizziness and giddiness: Secondary | ICD-10-CM | POA: Diagnosis not present

## 2015-05-12 DIAGNOSIS — G4733 Obstructive sleep apnea (adult) (pediatric): Secondary | ICD-10-CM | POA: Diagnosis not present

## 2015-05-12 DIAGNOSIS — K5732 Diverticulitis of large intestine without perforation or abscess without bleeding: Secondary | ICD-10-CM | POA: Diagnosis not present

## 2015-05-12 DIAGNOSIS — R110 Nausea: Secondary | ICD-10-CM | POA: Diagnosis not present

## 2015-05-12 DIAGNOSIS — K5901 Slow transit constipation: Secondary | ICD-10-CM | POA: Diagnosis not present

## 2015-05-13 DIAGNOSIS — R1032 Left lower quadrant pain: Secondary | ICD-10-CM | POA: Insufficient documentation

## 2015-05-14 DIAGNOSIS — F411 Generalized anxiety disorder: Secondary | ICD-10-CM | POA: Diagnosis not present

## 2015-05-14 DIAGNOSIS — G4700 Insomnia, unspecified: Secondary | ICD-10-CM | POA: Diagnosis not present

## 2015-05-14 DIAGNOSIS — R1032 Left lower quadrant pain: Secondary | ICD-10-CM | POA: Diagnosis not present

## 2015-05-14 DIAGNOSIS — M791 Myalgia: Secondary | ICD-10-CM | POA: Diagnosis not present

## 2015-05-14 DIAGNOSIS — R6884 Jaw pain: Secondary | ICD-10-CM | POA: Diagnosis not present

## 2015-06-03 DIAGNOSIS — M609 Myositis, unspecified: Secondary | ICD-10-CM | POA: Diagnosis not present

## 2015-06-03 DIAGNOSIS — R5381 Other malaise: Secondary | ICD-10-CM | POA: Diagnosis not present

## 2015-06-03 DIAGNOSIS — N951 Menopausal and female climacteric states: Secondary | ICD-10-CM | POA: Diagnosis not present

## 2015-07-18 DIAGNOSIS — J029 Acute pharyngitis, unspecified: Secondary | ICD-10-CM | POA: Diagnosis not present

## 2015-07-18 DIAGNOSIS — R509 Fever, unspecified: Secondary | ICD-10-CM | POA: Diagnosis not present

## 2015-07-18 DIAGNOSIS — J069 Acute upper respiratory infection, unspecified: Secondary | ICD-10-CM | POA: Diagnosis not present

## 2015-07-27 DIAGNOSIS — M6248 Contracture of muscle, other site: Secondary | ICD-10-CM | POA: Diagnosis not present

## 2015-07-27 DIAGNOSIS — M9901 Segmental and somatic dysfunction of cervical region: Secondary | ICD-10-CM | POA: Diagnosis not present

## 2015-07-27 DIAGNOSIS — G4763 Sleep related bruxism: Secondary | ICD-10-CM | POA: Diagnosis not present

## 2015-07-27 DIAGNOSIS — M797 Fibromyalgia: Secondary | ICD-10-CM | POA: Diagnosis not present

## 2015-07-27 DIAGNOSIS — M791 Myalgia: Secondary | ICD-10-CM | POA: Diagnosis not present

## 2015-07-27 DIAGNOSIS — M26633 Articular disc disorder of bilateral temporomandibular joint: Secondary | ICD-10-CM | POA: Diagnosis not present

## 2015-07-28 DIAGNOSIS — F3181 Bipolar II disorder: Secondary | ICD-10-CM | POA: Diagnosis not present

## 2015-07-30 DIAGNOSIS — G4730 Sleep apnea, unspecified: Secondary | ICD-10-CM | POA: Diagnosis not present

## 2015-07-30 DIAGNOSIS — G4733 Obstructive sleep apnea (adult) (pediatric): Secondary | ICD-10-CM | POA: Diagnosis not present

## 2015-08-10 DIAGNOSIS — K5901 Slow transit constipation: Secondary | ICD-10-CM | POA: Diagnosis not present

## 2015-08-10 DIAGNOSIS — Z8719 Personal history of other diseases of the digestive system: Secondary | ICD-10-CM | POA: Diagnosis not present

## 2015-08-11 DIAGNOSIS — G4730 Sleep apnea, unspecified: Secondary | ICD-10-CM | POA: Diagnosis not present

## 2015-08-18 ENCOUNTER — Encounter: Payer: Self-pay | Admitting: Pulmonary Disease

## 2015-08-18 ENCOUNTER — Ambulatory Visit (INDEPENDENT_AMBULATORY_CARE_PROVIDER_SITE_OTHER): Payer: PPO | Admitting: Pulmonary Disease

## 2015-08-18 VITALS — BP 110/70 | HR 61 | Ht 68.0 in | Wt 200.0 lb

## 2015-08-18 DIAGNOSIS — G4733 Obstructive sleep apnea (adult) (pediatric): Secondary | ICD-10-CM

## 2015-08-18 NOTE — Progress Notes (Signed)
Current Outpatient Prescriptions on File Prior to Visit  Medication Sig  . 5-Hydroxytryptophan (5-HTP PO) Take by mouth daily.  . Alpha-Lipoic Acid 300 MG CAPS Take by mouth daily.  . carisoprodol (SOMA) 350 MG tablet Take 350 mg by mouth as directed. Tapering down 2.5 mg a day  . Cholecalciferol (VITAMIN D-3) 5000 UNITS TABS Take by mouth daily.  . Cyanocobalamin (VITAMIN B-12 IJ) Inject as directed. weekly  . Digestive Enzymes (SIMILASE PO) Take by mouth. Takes 2 caps 3 times daily  . fentaNYL (DURAGESIC - DOSED MCG/HR) 12 MCG/HR Place 4.16 mcg onto the skin every 3 (three) days. Every 4 days  . GLUTATHIONE PO Take 1 capsule every Monday, Wednesday, Friday and Saturday  . GUAIFENESIN PO Take 1 capsule by mouth daily.   Marland Kitchen Hormone Cream Base CREA daily.  Marland Kitchen lithium carbonate (LITHOBID) 300 MG CR tablet Take 300 mg by mouth daily.   . Magnesium 100 MG CAPS Take by mouth. Takes 625 mg daily  . Nutritional Supplements (DHEA PO) Take 1 tablet by mouth daily.  Marland Kitchen oxycodone (OXY-IR) 5 MG capsule 1/4 tab 3-4 times daily as needed  . Probiotic Product (PROBIOTIC DAILY PO) Take by mouth. Takes 3 capsules twice daily  . QUEtiapine (SEROQUEL) 25 MG tablet Take 8.3 mg by mouth at bedtime.   . THYROID PO Take 162.5mg  daily except on Monday, Wednesday and Friday take 81.25mg   . UNABLE TO FIND Take 150 mg by mouth 2 (two) times daily.   Marland Kitchen UNABLE TO FIND Med Name: Mineral Drops daily  . UNABLE TO FIND Med Name: Immuno PRP 2 mg spray : 2 spray sublingually in am  . vitamin C (ASCORBIC ACID) 500 MG tablet Take 500 mg by mouth 2 (two) times daily.   No current facility-administered medications on file prior to visit.     Chief Complaint  Patient presents with  . Follow-up    Former [place] patient: Wears CPAP nightly. Denies problems with mask/pressure. Pt not having mask leaks - she turns on machine 2-3 hours before going to sleep and lets it run until she goes to bed. Pt orders supplies online.       Tests PSG 06/16/99 >> 56 CPAP 05/17/15 to 08/17/15 >> used on 79 of 93 nights with average 8 hrs 46 min.  Average AHI 3 with CPAP 10 cm H2O  Past medical hx Fatty liver, Fibromyalgia, TMJ, Allergies, Bipolar, Hypothyroidism  Past surgical hx, Allergies, Family hx, Social hx all reviewed.  Vital Signs BP 110/70 mmHg  Pulse 61  Ht 5\' 8"  (1.727 m)  Wt 200 lb (90.719 kg)  BMI 30.42 kg/m2  SpO2 100%  History of Present Illness Katherine James is a 55 y.o. female with obstructive sleep apnea.  She has several different machines.  Her newest machine is an S9.  She doesn't think the pressure on the S9 is working.  She was told by [facility] that she would have to pay $300 to get it fixed, or $1000 to get a new one.  It is not clear why these wouldn't be covered by insurance.  She does not have any issues with mask fit.  She gets sinus congestion and has to use nose sprays.   Physical Exam  General - No distress ENT - No sinus tenderness, no oral exudate, no LAN Cardiac - s1s2 regular, no murmur Chest - No wheeze/rales/dullness Back - No focal tenderness Abd - Soft, non-tender Ext - No edema Neuro - Normal strength Skin -  No rashes Psych - normal mood, and behavior   Assessment/Plan  Obstructive sleep apnea. - continue CPAP 10 cm H2O - will try to get new DME   Patient Instructions  Will try to arrange for new home care company for CPAP supplies  Follow up in 1 year     Chesley Mires, MD [place] Pager:  [phone] 08/18/2015, 2:01 PM

## 2015-08-18 NOTE — Patient Instructions (Signed)
Will try to arrange for new home care company for CPAP supplies  Follow up in 1 year

## 2015-09-19 DIAGNOSIS — G4763 Sleep related bruxism: Secondary | ICD-10-CM | POA: Diagnosis not present

## 2015-09-19 DIAGNOSIS — M26633 Articular disc disorder of bilateral temporomandibular joint: Secondary | ICD-10-CM | POA: Diagnosis not present

## 2015-09-19 DIAGNOSIS — M791 Myalgia: Secondary | ICD-10-CM | POA: Diagnosis not present

## 2015-09-19 DIAGNOSIS — M9901 Segmental and somatic dysfunction of cervical region: Secondary | ICD-10-CM | POA: Diagnosis not present

## 2015-09-19 DIAGNOSIS — M6248 Contracture of muscle, other site: Secondary | ICD-10-CM | POA: Diagnosis not present

## 2015-09-19 DIAGNOSIS — M797 Fibromyalgia: Secondary | ICD-10-CM | POA: Diagnosis not present

## 2015-10-13 DIAGNOSIS — F411 Generalized anxiety disorder: Secondary | ICD-10-CM | POA: Diagnosis not present

## 2015-10-13 DIAGNOSIS — R6884 Jaw pain: Secondary | ICD-10-CM | POA: Diagnosis not present

## 2015-10-13 DIAGNOSIS — M791 Myalgia: Secondary | ICD-10-CM | POA: Diagnosis not present

## 2015-10-13 DIAGNOSIS — G4700 Insomnia, unspecified: Secondary | ICD-10-CM | POA: Diagnosis not present

## 2015-10-17 DIAGNOSIS — G4700 Insomnia, unspecified: Secondary | ICD-10-CM | POA: Diagnosis not present

## 2015-10-17 DIAGNOSIS — M791 Myalgia: Secondary | ICD-10-CM | POA: Diagnosis not present

## 2015-10-17 DIAGNOSIS — R6884 Jaw pain: Secondary | ICD-10-CM | POA: Diagnosis not present

## 2015-10-17 DIAGNOSIS — F411 Generalized anxiety disorder: Secondary | ICD-10-CM | POA: Diagnosis not present

## 2015-10-24 DIAGNOSIS — M791 Myalgia: Secondary | ICD-10-CM | POA: Diagnosis not present

## 2015-10-24 DIAGNOSIS — F411 Generalized anxiety disorder: Secondary | ICD-10-CM | POA: Diagnosis not present

## 2015-10-24 DIAGNOSIS — G4700 Insomnia, unspecified: Secondary | ICD-10-CM | POA: Diagnosis not present

## 2015-10-24 DIAGNOSIS — R6884 Jaw pain: Secondary | ICD-10-CM | POA: Diagnosis not present

## 2015-10-25 DIAGNOSIS — M25561 Pain in right knee: Secondary | ICD-10-CM | POA: Diagnosis not present

## 2015-10-25 DIAGNOSIS — M25562 Pain in left knee: Secondary | ICD-10-CM | POA: Diagnosis not present

## 2015-10-25 DIAGNOSIS — M170 Bilateral primary osteoarthritis of knee: Secondary | ICD-10-CM | POA: Diagnosis not present

## 2015-11-01 DIAGNOSIS — R6884 Jaw pain: Secondary | ICD-10-CM | POA: Diagnosis not present

## 2015-11-01 DIAGNOSIS — M791 Myalgia: Secondary | ICD-10-CM | POA: Diagnosis not present

## 2015-11-01 DIAGNOSIS — Z1231 Encounter for screening mammogram for malignant neoplasm of breast: Secondary | ICD-10-CM | POA: Diagnosis not present

## 2015-11-01 DIAGNOSIS — F411 Generalized anxiety disorder: Secondary | ICD-10-CM | POA: Diagnosis not present

## 2015-11-01 DIAGNOSIS — G4700 Insomnia, unspecified: Secondary | ICD-10-CM | POA: Diagnosis not present

## 2015-11-01 DIAGNOSIS — Z01419 Encounter for gynecological examination (general) (routine) without abnormal findings: Secondary | ICD-10-CM | POA: Diagnosis not present

## 2015-11-08 DIAGNOSIS — M170 Bilateral primary osteoarthritis of knee: Secondary | ICD-10-CM | POA: Diagnosis not present

## 2015-11-08 DIAGNOSIS — M25562 Pain in left knee: Secondary | ICD-10-CM | POA: Diagnosis not present

## 2015-11-08 DIAGNOSIS — M25561 Pain in right knee: Secondary | ICD-10-CM | POA: Diagnosis not present

## 2015-11-08 DIAGNOSIS — R262 Difficulty in walking, not elsewhere classified: Secondary | ICD-10-CM | POA: Diagnosis not present

## 2015-11-09 ENCOUNTER — Emergency Department
Admission: EM | Admit: 2015-11-09 | Discharge: 2015-11-09 | Discharge status: Against Medical Advice | Payer: PPO | Attending: Emergency Medicine | Admitting: Emergency Medicine

## 2015-11-09 DIAGNOSIS — Z760 Encounter for issue of repeat prescription: Secondary | ICD-10-CM

## 2015-11-09 DIAGNOSIS — Z79899 Other long term (current) drug therapy: Secondary | ICD-10-CM | POA: Diagnosis not present

## 2015-11-09 DIAGNOSIS — M26621 Arthralgia of right temporomandibular joint: Secondary | ICD-10-CM | POA: Diagnosis not present

## 2015-11-09 DIAGNOSIS — Z87891 Personal history of nicotine dependence: Secondary | ICD-10-CM | POA: Insufficient documentation

## 2015-11-09 DIAGNOSIS — G8929 Other chronic pain: Secondary | ICD-10-CM | POA: Diagnosis not present

## 2015-11-09 DIAGNOSIS — E039 Hypothyroidism, unspecified: Secondary | ICD-10-CM | POA: Diagnosis not present

## 2015-11-09 NOTE — ED Triage Notes (Signed)
Pt reports that she has been being seen by Dr Judeen Hammans for fibromyalgia pain and she sees Dr Jerrol Banana for her TMJ pain  Pt reports that she has been on fentanyl patches for several years along with roxicodone for her fibromyalgia pain and her TMJ doctor has been prescribing her Norco - last month he decreased her dose without her knowledge and she now cannot get anymore medication until after the 24th of this month

## 2015-11-09 NOTE — ED Provider Notes (Signed)
[facility] Provider Note   CSN: [id number] Arrival date & time: 11/09/15  1535     History   Chief Complaint Chief Complaint  Patient presents with  . Medication Refill    HPI Katherine James is a 55 y.o. female presents emergent department for refill of her pain medication. Patient has been treated with Norco by her dentist and fibula patches by PCP. Patient was being treated for TMJ and fibromyalgia. Patient had been taking the medications as prescribed, was recently determined that she would need pain medication management. She has a pain management appointment scheduled on 11/28/2015. Patient states she has been tapering down off of her fentanyl patches, last prescription for fentanyl was prescribed 08/10/2015, a 30 day supply. Patient's last Norco prescription was 10/20/2015, a 24 day prescription. Patient denies any acute complications or pain. This is her normal chronic pain. Patient states she does not have enough medications to get her through to her pain management appointment. She states she is unable to get any medications from her primary care physician or dentist.       HPI  Past Medical History:  Diagnosis Date  . Adrenal failure ([place])    now classified as adrenal fatigue  . Allergy   . Bipolar 1 disorder ([place])   . Chondromalacia   . Fatty liver   . Fibromyalgia   . Hypothyroidism   . OSA (obstructive sleep apnea)    cpap  . TMJ syndrome     Patient Active Problem List   Diagnosis Date Noted  . Obstructive sleep apnea 03/25/2008  . FATTY LIVER DISEASE 10/31/2006  . FIBROMYALGIA 10/31/2006  . 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 ABDOMINAL HYSTERECTOMY  1998  . bilateral breast reduction  1999  . bilateral salpingoopherectomy  2003  . [place]  . LIGAMENT REPAIR  1982   left ankle  . LIPOSUCTION  1999   abdomen  . PLANTAR FASCIA RELEASE  1994, 1995     bilateral  . REDUCTION MAMMAPLASTY Bilateral 1999  . TONSILLECTOMY  1978  . TUBAL LIGATION  1996    OB History    No data available       Home Medications    Prior to Admission medications   Medication Sig Start Date End Date Taking? Authorizing Provider  HYDROcodone-acetaminophen (NORCO) 7.5-325 MG tablet Take 1 tablet by mouth every 6 (six) hours as needed for moderate pain.   Yes Historical Provider, MD  5-Hydroxytryptophan (5-HTP PO) Take by mouth daily.    Historical Provider, MD  acetaminophen (TYLENOL) 325 MG tablet Take 650 mg by mouth daily as needed.    Historical Provider, MD  Alpha-Lipoic Acid 300 MG CAPS Take by mouth daily.    Historical Provider, MD  carisoprodol (SOMA) 350 MG tablet Take 350 mg by mouth as directed. Tapering down 2.5 mg a day    Historical Provider, MD  Cholecalciferol (VITAMIN D-3) 5000 UNITS TABS Take by mouth daily.    Historical Provider, MD  Cyanocobalamin (VITAMIN B-12 IJ) Inject as directed. weekly    Historical Provider, MD  Digestive Enzymes (SIMILASE PO) Take by mouth. Takes 2 caps 3 times daily    Historical Provider, MD  fentaNYL (DURAGESIC - DOSED MCG/HR) 12 MCG/HR Place 4.16 mcg onto the skin every 3 (three) days. Every 4 days    Historical Provider, MD  GLUTATHIONE PO Take 1 capsule every Monday, Wednesday, Friday and Saturday  Historical Provider, MD  GUAIFENESIN PO Take 1 capsule by mouth daily.     Historical Provider, MD  Hormone Cream Base CREA daily.    Historical Provider, MD  lithium carbonate (LITHOBID) 300 MG CR tablet Take 300 mg by mouth daily.     Historical Provider, MD  Magnesium 100 MG CAPS Take by mouth. Takes 625 mg daily    Historical Provider, MD  MAGNESIUM PO Take 1,200 mg by mouth daily.    Historical Provider, MD  Nutritional Supplements (DHEA PO) Take 1 tablet by mouth daily.    Historical Provider, MD  oxycodone (OXY-IR) 5 MG capsule 1/4 tab 3-4 times daily as needed    Historical Provider, MD  Probiotic  Product (PROBIOTIC DAILY PO) Take by mouth. Takes 3 capsules twice daily    Historical Provider, MD  QUEtiapine (SEROQUEL) 25 MG tablet Take 8.3 mg by mouth at bedtime.     Historical Provider, MD  THYROID PO Take 162.5mg  daily except on Monday, Wednesday and Friday take 81.25mg     Historical Provider, MD  UNABLE TO FIND Take 150 mg by mouth 2 (two) times daily.     Historical Provider, MD  UNABLE TO FIND Med Name: Mineral Drops daily    Historical Provider, MD  UNABLE TO FIND Med Name: Immuno PRP 2 mg spray : 2 spray sublingually in am    Historical Provider, MD  [place] Name: Pregnenolone 60mg  daily    Historical Provider, MD  vitamin C (ASCORBIC ACID) 500 MG tablet Take 500 mg by mouth 2 (two) times daily.    Historical Provider, MD    Family History Family History  Problem Relation Age of Onset  . Allergies Sister   . Asthma      nephew  . Heart disease Father   . Skin cancer Father   . Skin cancer Mother   . Stomach cancer Neg Hx   . Colon cancer Neg Hx     Social History Social History  Substance Use Topics  . Smoking status: Former Smoker    Quit date: 11/16/2007  . Smokeless tobacco: Not on file     Comment: Pt was light smoker in college  . Alcohol use Yes     Comment: rare     Allergies   Alprazolam; Amoxicillin-pot clavulanate; Celecoxib; Chlorpromazine hcl; Codeine; Depakote [divalproex sodium]; Desipramine; Diclofenac sodium; Doxycycline; Etodolac; Fluoxetine hcl; Fluticasone propionate; Ibuprofen; Influenza vaccines; Iodine; Ketorolac tromethamine; Levofloxacin; Meperidine hcl; Nabumetone; Naproxen; Neurontin [gabapentin]; Oxaprozin; Paroxetine; Phenergan [promethazine hcl]; Piroxicam; Pneumococcal vaccines; Rofecoxib; Skelaxin [metaxalone]; Sulfamethoxazole-trimethoprim; Sulindac; Terfenadine; Tetracycline; Tramadol hcl; and Wellbutrin [bupropion]   Review of Systems Review of Systems  Constitutional: Negative for chills and fever.  HENT: Positive  for ear pain. Negative for sore throat.        Bilateral TMJ pain  Eyes: Negative for pain and visual disturbance.  Respiratory: Negative for cough and shortness of breath.   Cardiovascular: Negative for chest pain and palpitations.  Gastrointestinal: Negative for abdominal pain and vomiting.  Genitourinary: Negative for dysuria and hematuria.  Musculoskeletal: Positive for back pain. Negative for arthralgias.  Skin: Negative for color change and rash.  Neurological: Negative for seizures and syncope.  All other systems reviewed and are negative.    Physical Exam Updated Vital Signs BP (!) 151/87 (BP Location: Left Arm)   Pulse 67   Temp 97.7 F (36.5 C) (Oral)   Resp 17   Ht 5\' 8"  (1.727 m)   Wt 90.7 kg  SpO2 98%   BMI 30.41 kg/m   Physical Exam  Constitutional: She is oriented to person, place, and time. She appears well-developed and well-nourished. No distress.  Patient appears in no apparent distress, she is calm. She is not diaphoretic  HENT:  Head: Normocephalic and atraumatic.  Right Ear: External ear normal.  Left Ear: External ear normal.  Nose: Nose normal.  Neck: Normal range of motion.  Cardiovascular: Normal rate.   Pulmonary/Chest: Effort normal. No respiratory distress.  Neurological: She is alert and oriented to person, place, and time.  Skin: Skin is warm and dry. She is not diaphoretic.  Psychiatric: She has a normal mood and affect. Her behavior is normal. Judgment and thought content normal.  Nursing note and vitals reviewed.    ED Treatments / Results  Labs (all labs ordered are listed, but only abnormal results are displayed) Labs Reviewed - No data to display  EKG  EKG Interpretation None       Radiology No results found.  Procedures Procedures (including critical care time)  Medications Ordered in ED Medications - No data to display   Initial Impression / Assessment and Plan / ED Course  I have reviewed the triage vital  signs and the nursing notes.  Pertinent labs & imaging results that were available during my care of the patient were reviewed by me and considered in my medical decision making (see chart for details).  Clinical Course   55 year old female with chronic pain from her TMJ and fibromyalgia. Patient's been prescribed chronic pain medications from PCP recently from a dentist. Patient was recently referred to pain management, has appointment on 11/28/2015. She has not been able to get any prescriptions from her PCP or her dentist. She currently has 3 fentanyl patches remaining, she is currently tapering off. She is requesting refills of medications today, I discussed with patient that we are unable to refill any chronic pain medications per hospital policy. She will need to follow-up with her health care provider to discuss this. Patient shows no signs of distress or withdrawal symptoms. Patient showed concerns about going into withdrawals. We discussed withdrawal symptoms, I did discuss possible admission for detox. Patient left the room in the emergency department stating she is not interested, she will continue tapering off of her fentanyl patches.  Final Clinical Impressions(s) / ED Diagnoses   Final diagnoses:  Medication refill  Chronic pain    New Prescriptions Discharge Medication List as of 11/09/2015  5:11 PM       Duanne Guess, PA-C 11/09/15 1747    Nena Polio, MD 11/18/15 1312

## 2015-11-09 NOTE — ED Notes (Signed)
See triage note.  States her regular PCP was writing for her pain meds and referred her to pain clinic. Hx of TMJ and fibromyalgia  Has been using fentayl patch and norco for pain

## 2015-11-14 DIAGNOSIS — M791 Myalgia: Secondary | ICD-10-CM | POA: Diagnosis not present

## 2015-11-14 DIAGNOSIS — F411 Generalized anxiety disorder: Secondary | ICD-10-CM | POA: Diagnosis not present

## 2015-11-14 DIAGNOSIS — R6884 Jaw pain: Secondary | ICD-10-CM | POA: Diagnosis not present

## 2015-11-14 DIAGNOSIS — G4700 Insomnia, unspecified: Secondary | ICD-10-CM | POA: Diagnosis not present

## 2015-11-15 ENCOUNTER — Other Ambulatory Visit: Payer: Self-pay | Admitting: Family Medicine

## 2015-11-15 DIAGNOSIS — N951 Menopausal and female climacteric states: Secondary | ICD-10-CM | POA: Diagnosis not present

## 2015-11-15 DIAGNOSIS — M1711 Unilateral primary osteoarthritis, right knee: Secondary | ICD-10-CM | POA: Diagnosis not present

## 2015-11-15 DIAGNOSIS — M669 Spontaneous rupture of unspecified tendon: Secondary | ICD-10-CM | POA: Diagnosis not present

## 2015-11-15 DIAGNOSIS — R5381 Other malaise: Secondary | ICD-10-CM | POA: Diagnosis not present

## 2015-11-15 DIAGNOSIS — M25561 Pain in right knee: Secondary | ICD-10-CM | POA: Diagnosis not present

## 2015-11-15 DIAGNOSIS — E041 Nontoxic single thyroid nodule: Secondary | ICD-10-CM

## 2015-11-16 DIAGNOSIS — F411 Generalized anxiety disorder: Secondary | ICD-10-CM | POA: Diagnosis not present

## 2015-11-16 DIAGNOSIS — G4700 Insomnia, unspecified: Secondary | ICD-10-CM | POA: Diagnosis not present

## 2015-11-16 DIAGNOSIS — M791 Myalgia: Secondary | ICD-10-CM | POA: Diagnosis not present

## 2015-11-16 DIAGNOSIS — R6884 Jaw pain: Secondary | ICD-10-CM | POA: Diagnosis not present

## 2015-11-22 ENCOUNTER — Ambulatory Visit: Admission: RE | Admit: 2015-11-22 | Payer: PPO | Source: Ambulatory Visit

## 2015-11-28 ENCOUNTER — Ambulatory Visit
Admission: RE | Admit: 2015-11-28 | Discharge: 2015-11-28 | Disposition: A | Discharge status: Home / Self Care | Payer: PPO | Source: Ambulatory Visit | Attending: Family Medicine | Admitting: Family Medicine

## 2015-11-28 DIAGNOSIS — M791 Myalgia: Secondary | ICD-10-CM | POA: Diagnosis not present

## 2015-11-28 DIAGNOSIS — E041 Nontoxic single thyroid nodule: Secondary | ICD-10-CM | POA: Diagnosis not present

## 2015-11-28 DIAGNOSIS — F319 Bipolar disorder, unspecified: Secondary | ICD-10-CM | POA: Diagnosis not present

## 2015-11-28 DIAGNOSIS — F1120 Opioid dependence, uncomplicated: Secondary | ICD-10-CM | POA: Diagnosis not present

## 2015-11-28 DIAGNOSIS — M26609 Unspecified temporomandibular joint disorder, unspecified side: Secondary | ICD-10-CM | POA: Diagnosis not present

## 2015-11-28 DIAGNOSIS — M266 Temporomandibular joint disorders: Secondary | ICD-10-CM | POA: Diagnosis not present

## 2015-11-28 DIAGNOSIS — G894 Chronic pain syndrome: Secondary | ICD-10-CM | POA: Diagnosis not present

## 2015-11-28 DIAGNOSIS — M2669 Other specified disorders of temporomandibular joint: Secondary | ICD-10-CM | POA: Diagnosis not present

## 2015-11-28 DIAGNOSIS — G8929 Other chronic pain: Secondary | ICD-10-CM | POA: Diagnosis not present

## 2015-11-30 DIAGNOSIS — R6884 Jaw pain: Secondary | ICD-10-CM | POA: Diagnosis not present

## 2015-11-30 DIAGNOSIS — M791 Myalgia: Secondary | ICD-10-CM | POA: Diagnosis not present

## 2015-11-30 DIAGNOSIS — F411 Generalized anxiety disorder: Secondary | ICD-10-CM | POA: Diagnosis not present

## 2015-11-30 DIAGNOSIS — G4700 Insomnia, unspecified: Secondary | ICD-10-CM | POA: Diagnosis not present

## 2015-12-05 DIAGNOSIS — F411 Generalized anxiety disorder: Secondary | ICD-10-CM | POA: Diagnosis not present

## 2015-12-05 DIAGNOSIS — M791 Myalgia: Secondary | ICD-10-CM | POA: Diagnosis not present

## 2015-12-05 DIAGNOSIS — G4700 Insomnia, unspecified: Secondary | ICD-10-CM | POA: Diagnosis not present

## 2015-12-05 DIAGNOSIS — R6884 Jaw pain: Secondary | ICD-10-CM | POA: Diagnosis not present

## 2015-12-07 DIAGNOSIS — R6884 Jaw pain: Secondary | ICD-10-CM | POA: Diagnosis not present

## 2015-12-07 DIAGNOSIS — F411 Generalized anxiety disorder: Secondary | ICD-10-CM | POA: Diagnosis not present

## 2015-12-07 DIAGNOSIS — G4700 Insomnia, unspecified: Secondary | ICD-10-CM | POA: Diagnosis not present

## 2015-12-07 DIAGNOSIS — M791 Myalgia: Secondary | ICD-10-CM | POA: Diagnosis not present

## 2015-12-19 DIAGNOSIS — M79671 Pain in right foot: Secondary | ICD-10-CM | POA: Diagnosis not present

## 2015-12-19 DIAGNOSIS — J0100 Acute maxillary sinusitis, unspecified: Secondary | ICD-10-CM | POA: Diagnosis not present

## 2015-12-22 DIAGNOSIS — F411 Generalized anxiety disorder: Secondary | ICD-10-CM | POA: Diagnosis not present

## 2015-12-22 DIAGNOSIS — M791 Myalgia: Secondary | ICD-10-CM | POA: Diagnosis not present

## 2015-12-22 DIAGNOSIS — G4700 Insomnia, unspecified: Secondary | ICD-10-CM | POA: Diagnosis not present

## 2015-12-22 DIAGNOSIS — R6884 Jaw pain: Secondary | ICD-10-CM | POA: Diagnosis not present

## 2015-12-26 ENCOUNTER — Telehealth: Payer: Self-pay | Admitting: Pulmonary Disease

## 2015-12-26 DIAGNOSIS — G4733 Obstructive sleep apnea (adult) (pediatric): Secondary | ICD-10-CM

## 2015-12-26 NOTE — Telephone Encounter (Signed)
LMTC x 1  

## 2015-12-27 DIAGNOSIS — M1812 Unilateral primary osteoarthritis of first carpometacarpal joint, left hand: Secondary | ICD-10-CM | POA: Diagnosis not present

## 2015-12-27 DIAGNOSIS — M791 Myalgia: Secondary | ICD-10-CM | POA: Diagnosis not present

## 2015-12-27 DIAGNOSIS — M79671 Pain in right foot: Secondary | ICD-10-CM | POA: Diagnosis not present

## 2015-12-27 DIAGNOSIS — R6884 Jaw pain: Secondary | ICD-10-CM | POA: Diagnosis not present

## 2015-12-27 DIAGNOSIS — F411 Generalized anxiety disorder: Secondary | ICD-10-CM | POA: Diagnosis not present

## 2015-12-27 DIAGNOSIS — M19049 Primary osteoarthritis, unspecified hand: Secondary | ICD-10-CM | POA: Insufficient documentation

## 2015-12-27 DIAGNOSIS — G4700 Insomnia, unspecified: Secondary | ICD-10-CM | POA: Diagnosis not present

## 2015-12-27 NOTE — Telephone Encounter (Signed)
Pt returned phone call. Pt contact # [phone]. Pt request a detail message to be left at this number.Katherine James

## 2015-12-27 NOTE — Telephone Encounter (Signed)
lmtcb X2 for pt.  

## 2015-12-28 NOTE — Telephone Encounter (Signed)
Pt returning call and said to leave a detailed msg and if there are question she says to leave the question on voicemail please and she will and she will call back and leave answer. Pt was kinda condesending wanting to know if you guys looked @ and read entire msgs  States that no one left a name but ashley and still then no msg was left, she is tired of playing phone tag.Katherine James

## 2015-12-28 NOTE — Telephone Encounter (Signed)
Spoke with pt. She is currently buying her CPAP supplies online. This is getting to costly for her. Pt is currently set up with [facility] but wants to get set up with [facility]. States, "Huey Romans is terrible." Advised her that we would send an order to [facility] for CPAP supplies. Nothing further was needed.

## 2015-12-28 NOTE — Telephone Encounter (Signed)
lmtcb x3 for pt. 

## 2016-01-04 DIAGNOSIS — R6884 Jaw pain: Secondary | ICD-10-CM | POA: Diagnosis not present

## 2016-01-04 DIAGNOSIS — F411 Generalized anxiety disorder: Secondary | ICD-10-CM | POA: Diagnosis not present

## 2016-01-04 DIAGNOSIS — G4700 Insomnia, unspecified: Secondary | ICD-10-CM | POA: Diagnosis not present

## 2016-01-04 DIAGNOSIS — M791 Myalgia: Secondary | ICD-10-CM | POA: Diagnosis not present

## 2016-01-09 DIAGNOSIS — M791 Myalgia: Secondary | ICD-10-CM | POA: Diagnosis not present

## 2016-01-09 DIAGNOSIS — R6884 Jaw pain: Secondary | ICD-10-CM | POA: Diagnosis not present

## 2016-01-09 DIAGNOSIS — G4700 Insomnia, unspecified: Secondary | ICD-10-CM | POA: Diagnosis not present

## 2016-01-09 DIAGNOSIS — G8929 Other chronic pain: Secondary | ICD-10-CM | POA: Diagnosis not present

## 2016-01-09 DIAGNOSIS — F411 Generalized anxiety disorder: Secondary | ICD-10-CM | POA: Diagnosis not present

## 2016-01-09 DIAGNOSIS — F319 Bipolar disorder, unspecified: Secondary | ICD-10-CM | POA: Diagnosis not present

## 2016-01-09 DIAGNOSIS — F1120 Opioid dependence, uncomplicated: Secondary | ICD-10-CM | POA: Diagnosis not present

## 2016-01-09 DIAGNOSIS — M2669 Other specified disorders of temporomandibular joint: Secondary | ICD-10-CM | POA: Diagnosis not present

## 2016-01-09 DIAGNOSIS — G894 Chronic pain syndrome: Secondary | ICD-10-CM | POA: Diagnosis not present

## 2016-01-17 DIAGNOSIS — F411 Generalized anxiety disorder: Secondary | ICD-10-CM | POA: Diagnosis not present

## 2016-01-17 DIAGNOSIS — M791 Myalgia: Secondary | ICD-10-CM | POA: Diagnosis not present

## 2016-01-17 DIAGNOSIS — G4700 Insomnia, unspecified: Secondary | ICD-10-CM | POA: Diagnosis not present

## 2016-01-17 DIAGNOSIS — R6884 Jaw pain: Secondary | ICD-10-CM | POA: Diagnosis not present

## 2016-01-30 DIAGNOSIS — L578 Other skin changes due to chronic exposure to nonionizing radiation: Secondary | ICD-10-CM | POA: Diagnosis not present

## 2016-01-30 DIAGNOSIS — D2239 Melanocytic nevi of other parts of face: Secondary | ICD-10-CM | POA: Diagnosis not present

## 2016-01-30 DIAGNOSIS — B351 Tinea unguium: Secondary | ICD-10-CM | POA: Diagnosis not present

## 2016-01-30 DIAGNOSIS — Z79899 Other long term (current) drug therapy: Secondary | ICD-10-CM | POA: Diagnosis not present

## 2016-01-30 DIAGNOSIS — L739 Follicular disorder, unspecified: Secondary | ICD-10-CM | POA: Diagnosis not present

## 2016-01-30 DIAGNOSIS — L820 Inflamed seborrheic keratosis: Secondary | ICD-10-CM | POA: Diagnosis not present

## 2016-01-30 DIAGNOSIS — D2272 Melanocytic nevi of left lower limb, including hip: Secondary | ICD-10-CM | POA: Diagnosis not present

## 2016-01-30 DIAGNOSIS — D485 Neoplasm of uncertain behavior of skin: Secondary | ICD-10-CM | POA: Diagnosis not present

## 2016-01-30 DIAGNOSIS — D229 Melanocytic nevi, unspecified: Secondary | ICD-10-CM | POA: Diagnosis not present

## 2016-01-30 DIAGNOSIS — Z85828 Personal history of other malignant neoplasm of skin: Secondary | ICD-10-CM | POA: Diagnosis not present

## 2016-01-30 DIAGNOSIS — L821 Other seborrheic keratosis: Secondary | ICD-10-CM | POA: Diagnosis not present

## 2016-01-30 DIAGNOSIS — L814 Other melanin hyperpigmentation: Secondary | ICD-10-CM | POA: Diagnosis not present

## 2016-01-30 DIAGNOSIS — L570 Actinic keratosis: Secondary | ICD-10-CM | POA: Diagnosis not present

## 2016-01-30 DIAGNOSIS — Z1283 Encounter for screening for malignant neoplasm of skin: Secondary | ICD-10-CM | POA: Diagnosis not present

## 2016-01-30 DIAGNOSIS — Z808 Family history of malignant neoplasm of other organs or systems: Secondary | ICD-10-CM | POA: Diagnosis not present

## 2016-02-02 DIAGNOSIS — F3181 Bipolar II disorder: Secondary | ICD-10-CM | POA: Diagnosis not present

## 2016-02-06 DIAGNOSIS — F319 Bipolar disorder, unspecified: Secondary | ICD-10-CM | POA: Diagnosis not present

## 2016-02-06 DIAGNOSIS — G8929 Other chronic pain: Secondary | ICD-10-CM | POA: Diagnosis not present

## 2016-02-06 DIAGNOSIS — M791 Myalgia: Secondary | ICD-10-CM | POA: Diagnosis not present

## 2016-02-06 DIAGNOSIS — M2669 Other specified disorders of temporomandibular joint: Secondary | ICD-10-CM | POA: Diagnosis not present

## 2016-02-06 DIAGNOSIS — G894 Chronic pain syndrome: Secondary | ICD-10-CM | POA: Diagnosis not present

## 2016-02-06 DIAGNOSIS — F1120 Opioid dependence, uncomplicated: Secondary | ICD-10-CM | POA: Diagnosis not present

## 2016-02-09 DIAGNOSIS — R6884 Jaw pain: Secondary | ICD-10-CM | POA: Diagnosis not present

## 2016-02-09 DIAGNOSIS — G4700 Insomnia, unspecified: Secondary | ICD-10-CM | POA: Diagnosis not present

## 2016-02-09 DIAGNOSIS — F411 Generalized anxiety disorder: Secondary | ICD-10-CM | POA: Diagnosis not present

## 2016-02-09 DIAGNOSIS — M791 Myalgia: Secondary | ICD-10-CM | POA: Diagnosis not present

## 2016-03-05 DIAGNOSIS — Z79899 Other long term (current) drug therapy: Secondary | ICD-10-CM | POA: Diagnosis not present

## 2016-03-05 DIAGNOSIS — M609 Myositis, unspecified: Secondary | ICD-10-CM | POA: Diagnosis not present

## 2016-03-05 DIAGNOSIS — N959 Unspecified menopausal and perimenopausal disorder: Secondary | ICD-10-CM | POA: Diagnosis not present

## 2016-03-05 DIAGNOSIS — R5381 Other malaise: Secondary | ICD-10-CM | POA: Diagnosis not present

## 2016-03-13 DIAGNOSIS — F3181 Bipolar II disorder: Secondary | ICD-10-CM | POA: Diagnosis not present

## 2016-03-19 DIAGNOSIS — G894 Chronic pain syndrome: Secondary | ICD-10-CM | POA: Diagnosis not present

## 2016-03-19 DIAGNOSIS — G8929 Other chronic pain: Secondary | ICD-10-CM | POA: Diagnosis not present

## 2016-03-19 DIAGNOSIS — M2669 Other specified disorders of temporomandibular joint: Secondary | ICD-10-CM | POA: Diagnosis not present

## 2016-03-19 DIAGNOSIS — M791 Myalgia: Secondary | ICD-10-CM | POA: Diagnosis not present

## 2016-03-19 DIAGNOSIS — F319 Bipolar disorder, unspecified: Secondary | ICD-10-CM | POA: Diagnosis not present

## 2016-03-19 DIAGNOSIS — F1120 Opioid dependence, uncomplicated: Secondary | ICD-10-CM | POA: Diagnosis not present

## 2016-03-27 DIAGNOSIS — F3181 Bipolar II disorder: Secondary | ICD-10-CM | POA: Diagnosis not present

## 2016-04-16 DIAGNOSIS — F319 Bipolar disorder, unspecified: Secondary | ICD-10-CM | POA: Diagnosis not present

## 2016-04-16 DIAGNOSIS — M2669 Other specified disorders of temporomandibular joint: Secondary | ICD-10-CM | POA: Diagnosis not present

## 2016-04-16 DIAGNOSIS — M791 Myalgia: Secondary | ICD-10-CM | POA: Diagnosis not present

## 2016-04-16 DIAGNOSIS — G8929 Other chronic pain: Secondary | ICD-10-CM | POA: Diagnosis not present

## 2016-04-16 DIAGNOSIS — F1120 Opioid dependence, uncomplicated: Secondary | ICD-10-CM | POA: Diagnosis not present

## 2016-04-16 DIAGNOSIS — Z79899 Other long term (current) drug therapy: Secondary | ICD-10-CM | POA: Diagnosis not present

## 2016-04-16 DIAGNOSIS — G894 Chronic pain syndrome: Secondary | ICD-10-CM | POA: Diagnosis not present

## 2016-04-24 DIAGNOSIS — G4733 Obstructive sleep apnea (adult) (pediatric): Secondary | ICD-10-CM | POA: Diagnosis not present

## 2016-04-25 DIAGNOSIS — Z79899 Other long term (current) drug therapy: Secondary | ICD-10-CM | POA: Diagnosis not present

## 2016-05-07 DIAGNOSIS — G8929 Other chronic pain: Secondary | ICD-10-CM | POA: Diagnosis not present

## 2016-05-07 DIAGNOSIS — F319 Bipolar disorder, unspecified: Secondary | ICD-10-CM | POA: Diagnosis not present

## 2016-05-07 DIAGNOSIS — G894 Chronic pain syndrome: Secondary | ICD-10-CM | POA: Diagnosis not present

## 2016-05-07 DIAGNOSIS — F1120 Opioid dependence, uncomplicated: Secondary | ICD-10-CM | POA: Diagnosis not present

## 2016-05-07 DIAGNOSIS — M2669 Other specified disorders of temporomandibular joint: Secondary | ICD-10-CM | POA: Diagnosis not present

## 2016-05-07 DIAGNOSIS — M791 Myalgia: Secondary | ICD-10-CM | POA: Diagnosis not present

## 2016-05-07 DIAGNOSIS — Z79899 Other long term (current) drug therapy: Secondary | ICD-10-CM | POA: Diagnosis not present

## 2016-05-09 DIAGNOSIS — Z79899 Other long term (current) drug therapy: Secondary | ICD-10-CM | POA: Diagnosis not present

## 2016-05-11 DIAGNOSIS — E039 Hypothyroidism, unspecified: Secondary | ICD-10-CM | POA: Diagnosis not present

## 2016-05-11 DIAGNOSIS — N951 Menopausal and female climacteric states: Secondary | ICD-10-CM | POA: Diagnosis not present

## 2016-05-11 DIAGNOSIS — R5381 Other malaise: Secondary | ICD-10-CM | POA: Diagnosis not present

## 2016-06-06 DIAGNOSIS — Z79899 Other long term (current) drug therapy: Secondary | ICD-10-CM | POA: Diagnosis not present

## 2016-06-14 DIAGNOSIS — R300 Dysuria: Secondary | ICD-10-CM | POA: Diagnosis not present

## 2016-06-14 DIAGNOSIS — N390 Urinary tract infection, site not specified: Secondary | ICD-10-CM | POA: Diagnosis not present

## 2016-06-18 DIAGNOSIS — F319 Bipolar disorder, unspecified: Secondary | ICD-10-CM | POA: Diagnosis not present

## 2016-06-18 DIAGNOSIS — G894 Chronic pain syndrome: Secondary | ICD-10-CM | POA: Diagnosis not present

## 2016-06-18 DIAGNOSIS — F1120 Opioid dependence, uncomplicated: Secondary | ICD-10-CM | POA: Diagnosis not present

## 2016-06-18 DIAGNOSIS — Z79899 Other long term (current) drug therapy: Secondary | ICD-10-CM | POA: Diagnosis not present

## 2016-06-18 DIAGNOSIS — G8929 Other chronic pain: Secondary | ICD-10-CM | POA: Diagnosis not present

## 2016-06-18 DIAGNOSIS — M2669 Other specified disorders of temporomandibular joint: Secondary | ICD-10-CM | POA: Diagnosis not present

## 2016-06-18 DIAGNOSIS — M791 Myalgia: Secondary | ICD-10-CM | POA: Diagnosis not present

## 2016-07-03 ENCOUNTER — Ambulatory Visit (INDEPENDENT_AMBULATORY_CARE_PROVIDER_SITE_OTHER): Payer: PPO | Admitting: Unknown Physician Specialty

## 2016-07-03 ENCOUNTER — Encounter: Payer: Self-pay | Admitting: Unknown Physician Specialty

## 2016-07-03 DIAGNOSIS — J3489 Other specified disorders of nose and nasal sinuses: Secondary | ICD-10-CM

## 2016-07-03 DIAGNOSIS — M170 Bilateral primary osteoarthritis of knee: Secondary | ICD-10-CM | POA: Diagnosis not present

## 2016-07-03 DIAGNOSIS — G629 Polyneuropathy, unspecified: Secondary | ICD-10-CM | POA: Diagnosis not present

## 2016-07-03 DIAGNOSIS — F319 Bipolar disorder, unspecified: Secondary | ICD-10-CM | POA: Insufficient documentation

## 2016-07-03 DIAGNOSIS — R5382 Chronic fatigue, unspecified: Secondary | ICD-10-CM | POA: Diagnosis not present

## 2016-07-03 DIAGNOSIS — E039 Hypothyroidism, unspecified: Secondary | ICD-10-CM

## 2016-07-03 DIAGNOSIS — F3170 Bipolar disorder, currently in remission, most recent episode unspecified: Secondary | ICD-10-CM | POA: Diagnosis not present

## 2016-07-03 DIAGNOSIS — M797 Fibromyalgia: Secondary | ICD-10-CM | POA: Diagnosis not present

## 2016-07-03 DIAGNOSIS — M1990 Unspecified osteoarthritis, unspecified site: Secondary | ICD-10-CM | POA: Insufficient documentation

## 2016-07-03 DIAGNOSIS — Z7689 Persons encountering health services in other specified circumstances: Secondary | ICD-10-CM | POA: Diagnosis not present

## 2016-07-03 DIAGNOSIS — G4733 Obstructive sleep apnea (adult) (pediatric): Secondary | ICD-10-CM

## 2016-07-03 MED ORDER — MUPIROCIN 2 % EX OINT: 1.0000 "application " | TOPICAL_OINTMENT | Freq: Two times a day (BID) | CUTANEOUS | 0 refills | Status: DC

## 2016-07-03 MED ORDER — LIDOCAINE VISCOUS 2 % MT SOLN: 20.0000 mL | OROMUCOSAL | 0 refills | Status: DC | PRN

## 2016-07-03 NOTE — Assessment & Plan Note (Signed)
Chronic and recurrent.  Treat with Bactroban and lidocaine.

## 2016-07-03 NOTE — Assessment & Plan Note (Signed)
Using mostly functional medicine for treatment.  Getting regular labs and evaluation

## 2016-07-03 NOTE — Assessment & Plan Note (Signed)
Under treatment with integrative medicine

## 2016-07-03 NOTE — Progress Notes (Signed)
BP 125/82   Pulse 62   Temp 98.5 F (36.9 C)   Ht 5\' 8"  (1.727 m)   Wt 205 lb (93 kg)   SpO2 98%   BMI 31.17 kg/m    Subjective:    Patient ID: Katherine James, female    DOB: 06-02-1960, 56 y.o.   MRN: [id number]  HPI: Katherine James is a 56 y.o. female  Chief Complaint  Patient presents with  . Establish Care    pt states she would like the raw place on her nose looked at    She is here to be established as a patient as was told everyone needs a primary care provider.   States she sees an Public affairs consultant in [place]. She states she get labs through there.     History significant for Pain management Pt states she has a pain management physician who "is making me come here."   History is significant for Fibromyalgia and chronic fatigue.    Nasal lesion: Pt has a lesion on her nose that comes and goes year round.  States it will stay from days to months.    Relevant past medical, surgical, family and social history reviewed and updated as indicated. Interim medical history since our last visit reviewed. Allergies and medications reviewed and updated.  Review of Systems  Per HPI unless specifically indicated above     Objective:    BP 125/82   Pulse 62   Temp 98.5 F (36.9 C)   Ht 5\' 8"  (1.727 m)   Wt 205 lb (93 kg)   SpO2 98%   BMI 31.17 kg/m   Wt Readings from Last 3 Encounters:  07/03/16 205 lb (93 kg)  11/09/15 200 lb (90.7 kg)  08/18/15 200 lb (90.7 kg)    Physical Exam  Constitutional: She is oriented to person, place, and time. She appears well-developed and well-nourished. No distress.  HENT:  Head: Normocephalic and atraumatic.  Left nasal lesion that is erythemetous and tender  Eyes: Conjunctivae and lids are normal. Right eye exhibits no discharge. Left eye exhibits no discharge. No scleral icterus.  Neck: Normal range of motion. Neck supple. No JVD present. Carotid bruit is not present.  Cardiovascular: Normal rate, regular rhythm  and normal heart sounds.   Pulmonary/Chest: Effort normal and breath sounds normal.  Abdominal: Normal appearance. There is no splenomegaly or hepatomegaly.  Musculoskeletal: Normal range of motion.  Neurological: She is alert and oriented to person, place, and time.  Skin: Skin is warm, dry and intact. No rash noted. No pallor.  Psychiatric: She has a normal mood and affect. Her behavior is normal. Judgment and thought content normal.    Assessment & Plan:   Problem List Items Addressed This Visit      Unprioritized   Bipolar affective disorder ([place])    In remission with present treatment      Chronic fatigue   Encounter to establish care   Fibromyalgia    Using mostly functional medicine for treatment.  Getting regular labs and evaluation      Hypothyroid    Under treatment with integrative medicine      Nasal lesion    Chronic and recurrent.  Treat with Bactroban and lidocaine.        Neuropathy    Per pain management      Obstructive sleep apnea    Uses CPAP      Osteoarthritis   Relevant Medications   oxyCODONE (OXY IR/ROXICODONE) 5  MG immediate release tablet   carisoprodol (SOMA) 350 MG tablet       Follow up plan: Return if symptoms worsen or fail to improve.

## 2016-07-03 NOTE — Assessment & Plan Note (Signed)
Uses CPAP 

## 2016-07-03 NOTE — Assessment & Plan Note (Signed)
In remission with present treatment

## 2016-07-03 NOTE — Assessment & Plan Note (Signed)
Per pain management.  

## 2016-07-04 ENCOUNTER — Telehealth: Payer: Self-pay | Admitting: Family Medicine

## 2016-07-04 ENCOUNTER — Telehealth: Payer: Self-pay | Admitting: Unknown Physician Specialty

## 2016-07-04 NOTE — Telephone Encounter (Signed)
Called and spoke to patient. I told her that the medication was sent to [facility] and she stated that it was supposed to be sent to [facility]. Will call and cancel prescription at [facility] and call medication into [facility]. Patient thanked me.

## 2016-07-04 NOTE — Telephone Encounter (Signed)
Called medication into [facility] for patient. I let them know that she would like it to be delivered to her.

## 2016-07-04 NOTE — Telephone Encounter (Signed)
Called and cancelled prescription at [facility].

## 2016-07-04 NOTE — Telephone Encounter (Signed)
Patient states that she was at the office on 07/03/2016 to see the provider and was given a couple of prescriptions. Patient states that she received one of the prescriptions but did not receive the liquid lidocaine medication. Patient states that her pharmacy delivers to her house and wanted to make sure that the prescription can be delivered. Patient also asked if she could receive a call when the medication was ready at her pharmacy. Please advise.  Patient stated that a detailed message can be left on her voicemail also: [phone]  Thank you.

## 2016-07-05 NOTE — Telephone Encounter (Signed)
Error

## 2016-07-12 DIAGNOSIS — H0011 Chalazion right upper eyelid: Secondary | ICD-10-CM | POA: Diagnosis not present

## 2016-07-19 DIAGNOSIS — H2511 Age-related nuclear cataract, right eye: Secondary | ICD-10-CM | POA: Diagnosis not present

## 2016-08-01 DIAGNOSIS — Z79899 Other long term (current) drug therapy: Secondary | ICD-10-CM | POA: Diagnosis not present

## 2016-08-13 DIAGNOSIS — Z79899 Other long term (current) drug therapy: Secondary | ICD-10-CM | POA: Diagnosis not present

## 2016-08-13 DIAGNOSIS — M791 Myalgia: Secondary | ICD-10-CM | POA: Diagnosis not present

## 2016-08-13 DIAGNOSIS — F319 Bipolar disorder, unspecified: Secondary | ICD-10-CM | POA: Diagnosis not present

## 2016-08-13 DIAGNOSIS — F1120 Opioid dependence, uncomplicated: Secondary | ICD-10-CM | POA: Diagnosis not present

## 2016-08-13 DIAGNOSIS — M2669 Other specified disorders of temporomandibular joint: Secondary | ICD-10-CM | POA: Diagnosis not present

## 2016-08-13 DIAGNOSIS — G894 Chronic pain syndrome: Secondary | ICD-10-CM | POA: Diagnosis not present

## 2016-08-13 DIAGNOSIS — G8929 Other chronic pain: Secondary | ICD-10-CM | POA: Diagnosis not present

## 2016-08-20 DIAGNOSIS — M797 Fibromyalgia: Secondary | ICD-10-CM | POA: Diagnosis not present

## 2016-08-21 ENCOUNTER — Encounter: Payer: Self-pay | Admitting: Pulmonary Disease

## 2016-08-21 ENCOUNTER — Ambulatory Visit (INDEPENDENT_AMBULATORY_CARE_PROVIDER_SITE_OTHER): Payer: PPO | Admitting: Pulmonary Disease

## 2016-08-21 VITALS — BP 102/78 | HR 64 | Ht 68.0 in | Wt 205.0 lb

## 2016-08-21 DIAGNOSIS — G4733 Obstructive sleep apnea (adult) (pediatric): Secondary | ICD-10-CM | POA: Diagnosis not present

## 2016-08-21 DIAGNOSIS — Z9989 Dependence on other enabling machines and devices: Secondary | ICD-10-CM

## 2016-08-21 DIAGNOSIS — F3181 Bipolar II disorder: Secondary | ICD-10-CM | POA: Diagnosis not present

## 2016-08-21 NOTE — Patient Instructions (Signed)
Will arrange for new SD card for CPAP machine  Drop off card or mail it when you can  Follow up in 1 year
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
## 2016-08-28 DIAGNOSIS — R5383 Other fatigue: Secondary | ICD-10-CM | POA: Diagnosis not present

## 2016-08-28 DIAGNOSIS — M797 Fibromyalgia: Secondary | ICD-10-CM | POA: Diagnosis not present

## 2016-08-28 DIAGNOSIS — N959 Unspecified menopausal and perimenopausal disorder: Secondary | ICD-10-CM | POA: Diagnosis not present

## 2016-09-03 DIAGNOSIS — Z1211 Encounter for screening for malignant neoplasm of colon: Secondary | ICD-10-CM | POA: Diagnosis not present

## 2016-09-03 DIAGNOSIS — K5730 Diverticulosis of large intestine without perforation or abscess without bleeding: Secondary | ICD-10-CM | POA: Diagnosis not present

## 2016-09-03 DIAGNOSIS — K5901 Slow transit constipation: Secondary | ICD-10-CM | POA: Diagnosis not present

## 2016-09-12 DIAGNOSIS — M797 Fibromyalgia: Secondary | ICD-10-CM | POA: Diagnosis not present

## 2016-09-18 ENCOUNTER — Other Ambulatory Visit: Payer: Self-pay | Admitting: Family Medicine

## 2016-09-18 DIAGNOSIS — E039 Hypothyroidism, unspecified: Secondary | ICD-10-CM

## 2016-09-18 DIAGNOSIS — E049 Nontoxic goiter, unspecified: Secondary | ICD-10-CM

## 2016-10-08 DIAGNOSIS — M2669 Other specified disorders of temporomandibular joint: Secondary | ICD-10-CM | POA: Diagnosis not present

## 2016-10-08 DIAGNOSIS — F1120 Opioid dependence, uncomplicated: Secondary | ICD-10-CM | POA: Diagnosis not present

## 2016-10-08 DIAGNOSIS — F319 Bipolar disorder, unspecified: Secondary | ICD-10-CM | POA: Diagnosis not present

## 2016-10-08 DIAGNOSIS — M791 Myalgia: Secondary | ICD-10-CM | POA: Diagnosis not present

## 2016-10-08 DIAGNOSIS — F3181 Bipolar II disorder: Secondary | ICD-10-CM | POA: Diagnosis not present

## 2016-10-08 DIAGNOSIS — Z79899 Other long term (current) drug therapy: Secondary | ICD-10-CM | POA: Diagnosis not present

## 2016-10-08 DIAGNOSIS — G894 Chronic pain syndrome: Secondary | ICD-10-CM | POA: Diagnosis not present

## 2016-10-24 DIAGNOSIS — M797 Fibromyalgia: Secondary | ICD-10-CM | POA: Diagnosis not present

## 2016-10-30 DIAGNOSIS — D229 Melanocytic nevi, unspecified: Secondary | ICD-10-CM | POA: Diagnosis not present

## 2016-10-30 DIAGNOSIS — L700 Acne vulgaris: Secondary | ICD-10-CM | POA: Diagnosis not present

## 2016-10-30 DIAGNOSIS — L820 Inflamed seborrheic keratosis: Secondary | ICD-10-CM | POA: Diagnosis not present

## 2016-10-30 DIAGNOSIS — Z85828 Personal history of other malignant neoplasm of skin: Secondary | ICD-10-CM | POA: Diagnosis not present

## 2016-10-30 DIAGNOSIS — D239 Other benign neoplasm of skin, unspecified: Secondary | ICD-10-CM | POA: Diagnosis not present

## 2016-10-30 DIAGNOSIS — L739 Follicular disorder, unspecified: Secondary | ICD-10-CM | POA: Diagnosis not present

## 2016-10-30 DIAGNOSIS — D485 Neoplasm of uncertain behavior of skin: Secondary | ICD-10-CM | POA: Diagnosis not present

## 2016-10-31 ENCOUNTER — Ambulatory Visit: Payer: PPO | Admitting: Gastroenterology

## 2016-10-31 ENCOUNTER — Telehealth: Payer: Self-pay | Admitting: Gastroenterology

## 2016-11-01 ENCOUNTER — Ambulatory Visit: Payer: PPO

## 2016-11-21 ENCOUNTER — Ambulatory Visit: Payer: PPO

## 2016-11-21 ENCOUNTER — Ambulatory Visit
Admission: RE | Admit: 2016-11-21 | Discharge: 2016-11-21 | Disposition: A | Discharge status: Home / Self Care | Payer: PPO | Source: Ambulatory Visit | Attending: Family Medicine | Admitting: Family Medicine

## 2016-11-21 DIAGNOSIS — Z79899 Other long term (current) drug therapy: Secondary | ICD-10-CM | POA: Diagnosis not present

## 2016-11-21 DIAGNOSIS — N951 Menopausal and female climacteric states: Secondary | ICD-10-CM | POA: Diagnosis not present

## 2016-11-21 DIAGNOSIS — E049 Nontoxic goiter, unspecified: Secondary | ICD-10-CM

## 2016-11-21 DIAGNOSIS — E039 Hypothyroidism, unspecified: Secondary | ICD-10-CM

## 2016-11-21 DIAGNOSIS — E041 Nontoxic single thyroid nodule: Secondary | ICD-10-CM | POA: Diagnosis not present

## 2016-11-21 DIAGNOSIS — R5381 Other malaise: Secondary | ICD-10-CM | POA: Diagnosis not present

## 2016-11-21 DIAGNOSIS — R7309 Other abnormal glucose: Secondary | ICD-10-CM | POA: Diagnosis not present

## 2016-11-22 DIAGNOSIS — F3181 Bipolar II disorder: Secondary | ICD-10-CM | POA: Diagnosis not present

## 2016-12-03 DIAGNOSIS — G8929 Other chronic pain: Secondary | ICD-10-CM | POA: Diagnosis not present

## 2016-12-03 DIAGNOSIS — M2669 Other specified disorders of temporomandibular joint: Secondary | ICD-10-CM | POA: Diagnosis not present

## 2016-12-03 DIAGNOSIS — M26609 Unspecified temporomandibular joint disorder, unspecified side: Secondary | ICD-10-CM | POA: Diagnosis not present

## 2016-12-03 DIAGNOSIS — G894 Chronic pain syndrome: Secondary | ICD-10-CM | POA: Diagnosis not present

## 2016-12-03 DIAGNOSIS — F1120 Opioid dependence, uncomplicated: Secondary | ICD-10-CM | POA: Diagnosis not present

## 2016-12-03 DIAGNOSIS — F319 Bipolar disorder, unspecified: Secondary | ICD-10-CM | POA: Diagnosis not present

## 2016-12-03 DIAGNOSIS — M791 Myalgia, unspecified site: Secondary | ICD-10-CM | POA: Diagnosis not present

## 2016-12-03 DIAGNOSIS — Z79899 Other long term (current) drug therapy: Secondary | ICD-10-CM | POA: Diagnosis not present

## 2016-12-12 DIAGNOSIS — Z01419 Encounter for gynecological examination (general) (routine) without abnormal findings: Secondary | ICD-10-CM | POA: Diagnosis not present

## 2016-12-12 DIAGNOSIS — Z1231 Encounter for screening mammogram for malignant neoplasm of breast: Secondary | ICD-10-CM | POA: Diagnosis not present

## 2016-12-12 DIAGNOSIS — E039 Hypothyroidism, unspecified: Secondary | ICD-10-CM | POA: Diagnosis not present

## 2016-12-12 DIAGNOSIS — R7989 Other specified abnormal findings of blood chemistry: Secondary | ICD-10-CM | POA: Diagnosis not present

## 2016-12-24 ENCOUNTER — Ambulatory Visit: Payer: PPO

## 2017-01-07 DIAGNOSIS — F319 Bipolar disorder, unspecified: Secondary | ICD-10-CM | POA: Diagnosis not present

## 2017-01-07 DIAGNOSIS — M791 Myalgia, unspecified site: Secondary | ICD-10-CM | POA: Diagnosis not present

## 2017-01-07 DIAGNOSIS — Z79899 Other long term (current) drug therapy: Secondary | ICD-10-CM | POA: Diagnosis not present

## 2017-01-07 DIAGNOSIS — G8929 Other chronic pain: Secondary | ICD-10-CM | POA: Diagnosis not present

## 2017-01-07 DIAGNOSIS — F1120 Opioid dependence, uncomplicated: Secondary | ICD-10-CM | POA: Diagnosis not present

## 2017-01-07 DIAGNOSIS — M2669 Other specified disorders of temporomandibular joint: Secondary | ICD-10-CM | POA: Diagnosis not present

## 2017-01-07 DIAGNOSIS — G894 Chronic pain syndrome: Secondary | ICD-10-CM | POA: Diagnosis not present

## 2017-01-09 ENCOUNTER — Ambulatory Visit: Payer: PPO | Admitting: Unknown Physician Specialty

## 2017-01-09 ENCOUNTER — Encounter: Payer: Self-pay | Admitting: Unknown Physician Specialty

## 2017-01-09 VITALS — BP 145/84 | HR 64 | Temp 98.3°F | Wt 205.0 lb

## 2017-01-09 DIAGNOSIS — J0100 Acute maxillary sinusitis, unspecified: Secondary | ICD-10-CM | POA: Diagnosis not present

## 2017-01-09 DIAGNOSIS — J029 Acute pharyngitis, unspecified: Secondary | ICD-10-CM | POA: Diagnosis not present

## 2017-01-09 MED ORDER — AMOXICILLIN 875 MG PO TABS: 875.0000 mg | ORAL_TABLET | Freq: Two times a day (BID) | ORAL | 0 refills | Status: DC

## 2017-01-09 NOTE — Progress Notes (Signed)
   BP (!) 145/84   Pulse 64   Temp 98.3 F (36.8 C) (Oral)   Wt 205 lb (93 kg)   SpO2 98%   BMI 31.17 kg/m    Subjective:    Patient ID: Katherine James, female    DOB: Oct 08, 1960, 56 y.o.   MRN: [id number]  HPI: Katherine James is a 56 y.o. female  Chief Complaint  Patient presents with  . Sore Throat   1 week ago had fever and chills and took Tamiflu.  Pt states she never felt better.  She has had a persitstant fever.  Sore Throat   The problem has been gradually worsening. Maximum temperature: low grade fever of 99-100. The fever has been present for 1 to 2 days. Associated symptoms include congestion, coughing and headaches. Pertinent negatives include no diarrhea, drooling, ear pain, shortness of breath or vomiting. Associated symptoms comments: Muscle aches, not typical of her fibromyalgia.     Relevant past medical, surgical, family and social history reviewed and updated as indicated. Interim medical history since our last visit reviewed. Allergies and medications reviewed and updated.  Review of Systems  HENT: Positive for congestion. Negative for drooling and ear pain.   Respiratory: Positive for cough. Negative for shortness of breath.   Gastrointestinal: Negative for diarrhea and vomiting.  Neurological: Positive for headaches.    Per HPI unless specifically indicated above     Objective:    BP (!) 145/84   Pulse 64   Temp 98.3 F (36.8 C) (Oral)   Wt 205 lb (93 kg)   SpO2 98%   BMI 31.17 kg/m   Wt Readings from Last 3 Encounters:  01/09/17 205 lb (93 kg)  08/21/16 205 lb (93 kg)  07/03/16 205 lb (93 kg)    Physical Exam  Constitutional: She is oriented to person, place, and time. She appears well-developed and well-nourished. No distress.  HENT:  Head: Normocephalic and atraumatic.  Right Ear: Tympanic membrane and ear canal normal.  Left Ear: Tympanic membrane and ear canal normal.  Nose: No rhinorrhea. Right sinus exhibits maxillary sinus  tenderness. Right sinus exhibits no frontal sinus tenderness. Left sinus exhibits maxillary sinus tenderness. Left sinus exhibits no frontal sinus tenderness.  Eyes: Conjunctivae and lids are normal. Right eye exhibits no discharge. Left eye exhibits no discharge. No scleral icterus.  Cardiovascular: Normal rate and regular rhythm.  Pulmonary/Chest: Effort normal and breath sounds normal. No respiratory distress.  Abdominal: Normal appearance. There is no splenomegaly or hepatomegaly.  Musculoskeletal: Normal range of motion.  Neurological: She is alert and oriented to person, place, and time.  Skin: Skin is intact. No rash noted. No pallor.  Psychiatric: She has a normal mood and affect. Her behavior is normal. Judgment and thought content normal.    No results found for this or any previous visit.    Assessment & Plan:   Problem List Items Addressed This Visit    None    Visit Diagnoses    Sore throat    -  Primary   Relevant Orders   Rapid strep screen (not at [facility])   Acute non-recurrent maxillary sinusitis       Rx for Amoxil given to hold for a few days at pt request.  discussed fluids and rest   Relevant Medications   amoxicillin (AMOXIL) 875 MG tablet       Follow up plan: Return if symptoms worsen or fail to improve.

## 2017-01-11 LAB — CULTURE, GROUP A STREP: Strep A Culture: NEGATIVE

## 2017-01-11 LAB — RAPID STREP SCREEN (MED CTR MEBANE ONLY): Strep Gp A Ag, IA W/Reflex: NEGATIVE

## 2017-01-15 ENCOUNTER — Ambulatory Visit: Payer: Self-pay | Admitting: *Deleted

## 2017-01-15 NOTE — Telephone Encounter (Signed)
Pt started  Anti  Biotics   4  Days  Ago     Not  Any  Better      Taking  Tylenol     She  Reports  Weakness   Body  Aches    Reason for Disposition . [1] Sore throat is the only symptom AND [2] present > 48 hours  Answer Assessment - Initial Assessment Questions 1. ONSET: "When did the throat start hurting?" (Hours or days ago)       Fever   And body  Aches    With  Sore  Throat   approx   3   Weeks    2. SEVERITY: "How bad is the sore throat?" (Scale 1-10; mild, moderate or severe)   - MILD (1-3):  doesn't interfere with eating or normal activities   - MODERATE (4-7): interferes with eating some solids and normal activities   - SEVERE (8-10):  excruciating pain, interferes with most normal activities   - SEVERE DYSPHAGIA: can't swallow liquids, drooling      Severe    3. STREP EXPOSURE: "Has there been any exposure to strep within the past week?" If so, ask: "What type of contact occurred?"       No 4.  VIRAL SYMPTOMS: "Are there any symptoms of a cold, such as a runny nose, cough, hoarse voice or red eyes?"      Runny  Nose    -   Runny  Nose     Body  Aches    5. FEVER: "Do you have a fever?" If so, ask: "What is your temperature, how was it measured, and when did it start?"     Has  Fever    Up  And  Down     Taking  Tylenol   6. PUS ON THE TONSILS: "Is there pus on the tonsils in the back of your throat?"       Yellow   Area    On  Epiglottis    Some  Red  Streaks    7. OTHER SYMPTOMS: "Do you have any other symptoms?" (e.g., difficulty breathing, headache, rash)       Body  Aches   Weakness    Headache    acoss  Forehead   8. PREGNANCY: "Is there any chance you are pregnant?" "When was your last menstrual period?"      Hysterectomy  Protocols used: SORE THROAT-A-AH

## 2017-01-16 ENCOUNTER — Ambulatory Visit: Payer: PPO | Admitting: Family Medicine

## 2017-01-16 ENCOUNTER — Encounter: Payer: Self-pay | Admitting: Family Medicine

## 2017-01-16 VITALS — BP 121/74 | HR 73 | Temp 98.6°F | Wt 205.0 lb

## 2017-01-16 DIAGNOSIS — J069 Acute upper respiratory infection, unspecified: Secondary | ICD-10-CM

## 2017-01-16 MED ORDER — AZITHROMYCIN 250 MG PO TABS: ORAL_TABLET | ORAL | 0 refills | Status: DC

## 2017-01-16 NOTE — Progress Notes (Signed)
BP 121/74 (BP Location: Right Arm, Patient Position: Sitting, Cuff Size: Normal)   Pulse 73   Temp 98.6 F (37 C) (Oral)   Wt 205 lb (93 kg)   SpO2 99%   BMI 31.17 kg/m    Subjective:    Patient ID: Katherine James, female    DOB: [birth year], 56 y.o.   MRN: [id number]  HPI: Katherine James is a 56 y.o. female  Chief Complaint  Patient presents with  . Sore Throat    Was seen by Malachy Mood 01/15/2017. Patient states she is no better and worse. Patient reports her temp runs low and she's had a fever of 99.4 which is high for her.  . Shortness of Breath  . Cough  . Nasal Congestion   Patient presents with worsening sxs since OV several days ago. Was started on amoxicillin for cough, congestion, SOB, subjective fevers (states temp has been between 98.6 - 99.4). Took OTC medications the first week or so but they didn't help, and now no improvement with 3 days of abx. Rapid strep and flu both negative at prior visit.   Relevant past medical, surgical, family and social history reviewed and updated as indicated. Interim medical history since our last visit reviewed. Allergies and medications reviewed and updated.  Review of Systems  Constitutional: Positive for fatigue and fever.  HENT: Positive for congestion.   Respiratory: Positive for cough and shortness of breath.   Cardiovascular: Negative.   Musculoskeletal: Negative.   Neurological: Negative.   Psychiatric/Behavioral: Negative.    Per HPI unless specifically indicated above     Objective:    BP 121/74 (BP Location: Right Arm, Patient Position: Sitting, Cuff Size: Normal)   Pulse 73   Temp 98.6 F (37 C) (Oral)   Wt 205 lb (93 kg)   SpO2 99%   BMI 31.17 kg/m   Wt Readings from Last 3 Encounters:  01/16/17 205 lb (93 kg)  01/09/17 205 lb (93 kg)  08/21/16 205 lb (93 kg)    Physical Exam  Constitutional: She is oriented to person, place, and time. She appears well-developed and well-nourished. No distress.  HENT:    Head: Atraumatic.  Right Ear: External ear normal.  Left Ear: External ear normal.  Nose: Nose normal.  Oropharynx very mildly erythematous  Eyes: Conjunctivae are normal. Pupils are equal, round, and reactive to light.  Neck: Normal range of motion. Neck supple.  Cardiovascular: Normal rate and normal heart sounds.  Pulmonary/Chest: Effort normal and breath sounds normal. No respiratory distress.  Musculoskeletal: Normal range of motion.  Neurological: She is alert and oriented to person, place, and time.  Skin: Skin is warm and dry.  Psychiatric: She has a normal mood and affect. Her behavior is normal.  Nursing note and vitals reviewed.  Results for orders placed or performed in visit on 01/09/17  Rapid strep screen (not at [facility])  Result Value Ref Range   Strep Gp A Ag, IA W/Reflex Negative Negative  Culture, Group A Strep  Result Value Ref Range   Strep A Culture Negative       Assessment & Plan:   Problem List Items Addressed This Visit    None    Visit Diagnoses    Upper respiratory tract infection, unspecified type    -  Primary   Vitals WNL, exam fairly benign, rapid testing neg. Unclear what's causing her sxs. Switch to zpack, start symbicort inhaler sample as she refuses prednisone.    Relevant Medications  azithromycin (ZITHROMAX) 250 MG tablet       Follow up plan: Return if symptoms worsen or fail to improve.

## 2017-01-18 ENCOUNTER — Telehealth: Payer: Self-pay | Admitting: Family Medicine

## 2017-01-18 ENCOUNTER — Telehealth: Payer: Self-pay | Admitting: Unknown Physician Specialty

## 2017-01-18 MED ORDER — PREDNISONE 20 MG PO TABS: 40.0000 mg | ORAL_TABLET | Freq: Every day | ORAL | 0 refills | Status: DC

## 2017-01-18 NOTE — Telephone Encounter (Signed)
Left message on home phone that her request will be routed to provider's office.

## 2017-01-18 NOTE — Telephone Encounter (Signed)
Copied from [place] [id number]. Topic: Quick Communication - See Telephone Encounter >> Jan 18, 2017 12:36 PM Hewitt Shorts wrote: CRM for notification. See Telephone encounter for: pt states that she now is developing thrush and needs something called it to [place] number   01/18/17.

## 2017-01-18 NOTE — Telephone Encounter (Signed)
Patient notified

## 2017-01-18 NOTE — Telephone Encounter (Signed)
Copied from [place]. Topic: Inquiry >> Jan 18, 2017 11:07 AM Oliver Pila B wrote: Reason for CRM: pt called to inform that the inhaler is making her dizzy and she is going to stop taking it, and she is awaiting to have pregnizone called in for her, contact pt if needed

## 2017-01-18 NOTE — Telephone Encounter (Signed)
Copied from [place]. Topic: General - Other >> Jan 18, 2017 10:16 AM Darl Householder, RMA wrote: Reason for CRM: patient was seen on 01/16/2017 by Merrie Roof, PA and was told to call back if she was not well by today and would like to have the prednisone prescription sent to [facility], and would like toknow if she should continue the steroid inhaler (Symbicort)

## 2017-01-18 NOTE — Telephone Encounter (Signed)
Routing to provider who saw the patient last.

## 2017-01-18 NOTE — Telephone Encounter (Signed)
Routing to provider  

## 2017-01-18 NOTE — Telephone Encounter (Signed)
Sent over prednisone burst for her to start. She had told me during visit that she won't take steroids due to intolerances so she should be cautious

## 2017-01-19 NOTE — Patient Instructions (Signed)
Follow up as needed

## 2017-01-21 MED ORDER — NYSTATIN 100000 UNIT/ML MT SUSP: 5.0000 mL | Freq: Four times a day (QID) | OROMUCOSAL | 1 refills | Status: DC

## 2017-01-21 NOTE — Telephone Encounter (Signed)
Patient notified

## 2017-01-21 NOTE — Telephone Encounter (Signed)
Had replied in another threat

## 2017-01-21 NOTE — Telephone Encounter (Signed)
Nystatin sent

## 2017-01-22 ENCOUNTER — Ambulatory Visit: Payer: PPO | Admitting: Unknown Physician Specialty

## 2017-01-22 ENCOUNTER — Ambulatory Visit
Admission: RE | Admit: 2017-01-22 | Discharge: 2017-01-22 | Disposition: A | Discharge status: Home / Self Care | Payer: PPO | Source: Ambulatory Visit | Attending: Unknown Physician Specialty | Admitting: Unknown Physician Specialty

## 2017-01-22 ENCOUNTER — Encounter: Payer: Self-pay | Admitting: Unknown Physician Specialty

## 2017-01-22 VITALS — BP 134/83 | HR 72 | Temp 98.9°F | Wt 205.0 lb

## 2017-01-22 DIAGNOSIS — R059 Cough, unspecified: Secondary | ICD-10-CM

## 2017-01-22 DIAGNOSIS — R6883 Chills (without fever): Secondary | ICD-10-CM | POA: Diagnosis not present

## 2017-01-22 DIAGNOSIS — R079 Chest pain, unspecified: Secondary | ICD-10-CM | POA: Insufficient documentation

## 2017-01-22 DIAGNOSIS — R05 Cough: Secondary | ICD-10-CM

## 2017-01-22 LAB — VERITOR FLU A/B WAIVED
INFLUENZA A: NEGATIVE
Influenza B: NEGATIVE

## 2017-01-22 NOTE — Progress Notes (Signed)
BP 134/83   Pulse 72   Temp 98.9 F (37.2 C) (Oral)   Wt 205 lb (93 kg)   SpO2 98%   BMI 31.17 kg/m    Subjective:    Patient ID: Katherine James, female    DOB: Feb 17, 1961, 56 y.o.   MRN: [id number]  HPI: Katherine James is a 56 y.o. female  Chief Complaint  Patient presents with  . URI    pt states she is no better and is worse, still taking prednisone  . Chest Pain    pt states she has been having sharp pains on the right side of her chest and some tightness  . Labs Only    pt requesting to have a CBC and Chest X-ray done   Pt is frustrated.  She is getting progressively worse.  Symptoms started 3 1/2 weeks ago.  Had URI symptoms and a negative strep.  This was negative.  Ended up taking Amoxil.  Started having burning in chest throat and lungs.  Would lose voice and random headaches.  Switched from Amoxil to  Z pack due to wheezing and finished 3 days ago.  Taking prednisone, doing own taper, and not sure about any improvement.  Yesterday was the worst day.  Today no worse and headache and facial pain is gone.   Having chills.    Relevant past medical, surgical, family and social history reviewed and updated as indicated. Interim medical history since our last visit reviewed. Allergies and medications reviewed and updated.  Review of Systems  Constitutional: Positive for activity change, chills, fatigue and fever.  HENT: Positive for sinus pain, sore throat and voice change.   Respiratory: Positive for cough and wheezing.   Gastrointestinal: Negative for constipation and diarrhea.  Musculoskeletal: Positive for myalgias.  Neurological: Positive for weakness and headaches.    Per HPI unless specifically indicated above     Objective:    BP 134/83   Pulse 72   Temp 98.9 F (37.2 C) (Oral)   Wt 205 lb (93 kg)   SpO2 98%   BMI 31.17 kg/m   Wt Readings from Last 3 Encounters:  01/22/17 205 lb (93 kg)  01/16/17 205 lb (93 kg)  01/09/17 205 lb (93 kg)    Physical  Exam  Constitutional: She is oriented to person, place, and time. She appears well-developed and well-nourished. No distress.  HENT:  Head: Normocephalic and atraumatic.  Right Ear: Tympanic membrane and ear canal normal.  Left Ear: Tympanic membrane and ear canal normal.  Nose: Rhinorrhea present. Right sinus exhibits no maxillary sinus tenderness and no frontal sinus tenderness. Left sinus exhibits no maxillary sinus tenderness and no frontal sinus tenderness.  Mouth/Throat: Mucous membranes are normal. Posterior oropharyngeal erythema present.  Eyes: Conjunctivae and lids are normal. Right eye exhibits no discharge. Left eye exhibits no discharge. No scleral icterus.  Cardiovascular: Normal rate and regular rhythm.  Pulmonary/Chest: Effort normal and breath sounds normal. No respiratory distress.  Abdominal: Normal appearance. There is no splenomegaly or hepatomegaly.  Musculoskeletal: Normal range of motion.  Neurological: She is alert and oriented to person, place, and time.  Skin: Skin is intact. No rash noted. No pallor.  Psychiatric: She has a normal mood and affect. Her behavior is normal. Judgment and thought content normal.    Results for orders placed or performed in visit on 01/09/17  Rapid strep screen (not at [facility])  Result Value Ref Range   Strep Gp A Ag, IA W/Reflex Negative  Negative  Culture, Group A Strep  Result Value Ref Range   Strep A Culture Negative       Assessment & Plan:   Problem List Items Addressed This Visit    None    Visit Diagnoses    Chest pain, unspecified type    -  Primary   Persistent and worsening burining in chest.  Completed Zpack.  Get chest x-ray.  Discussed CBC not reliable as on Prednisone.  Instructed to restart Amoxil   Relevant Orders   DG Chest 2 View   Cough       Mild but specific right sided chest pain.  Will check x-ray   Relevant Orders   DG Chest 2 View   Veritor Flu A/B Waived   Chills       Pt states chills are a  problem.  States current "normal"  temperature is a high fever for her.         Follow up plan: Chest x-ray results

## 2017-01-24 DIAGNOSIS — R945 Abnormal results of liver function studies: Secondary | ICD-10-CM | POA: Diagnosis not present

## 2017-01-24 DIAGNOSIS — E063 Autoimmune thyroiditis: Secondary | ICD-10-CM | POA: Diagnosis not present

## 2017-01-24 MED ORDER — PREDNISONE 20 MG PO TABS: 40.0000 mg | ORAL_TABLET | Freq: Every day | ORAL | 0 refills | Status: DC

## 2017-01-24 NOTE — Telephone Encounter (Signed)
Pt stated yesterday she thought she was on the mend. She got a sore throat yesterday. Today her lungs feel tight & feels like she is getting worse. She wants to know if she will call in more steriods. She said she is willing to try something else. Only has a couple more days of amoxicillin. Cant see her ENT until next Thursday. Pharmacy [facility]. Wants meds delivered if she does call anything in.

## 2017-01-24 NOTE — Addendum Note (Signed)
Addended by: Merrie Roof E on: 01/24/2017 03:40 PM   Modules accepted: Orders

## 2017-01-24 NOTE — Telephone Encounter (Signed)
Sent in more prednisone, shouldn't need another round of antibiotics right now but we will let ENT take another look

## 2017-01-30 ENCOUNTER — Telehealth: Payer: Self-pay | Admitting: Unknown Physician Specialty

## 2017-01-30 NOTE — Telephone Encounter (Signed)
Copied from [place]. Topic: Quick Communication - See Telephone Encounter >> Jan 30, 2017  3:16 PM Burnis Medin, NT wrote: CRM for notification. See Telephone encounter for: Pt called in to see if she can get a refill on a mouth wash that was prescribe to her previously. Pt. Said she still has a lot of thrush in her mouth. Pt does not know the name of mouth wash. Pt. Ages in [place].   01/30/17.

## 2017-01-31 NOTE — Telephone Encounter (Signed)
Called pt. to discuss symptoms of thrush.  The pt. stated she had already contacted another MD to get the [place].  Stated "I wasn't going to wait 3 days for a refill."  I apologized to the pt. for the delay in returning her call.

## 2017-02-18 ENCOUNTER — Ambulatory Visit: Payer: PPO | Admitting: Unknown Physician Specialty

## 2017-02-18 ENCOUNTER — Other Ambulatory Visit: Payer: Self-pay

## 2017-02-18 ENCOUNTER — Encounter: Payer: Self-pay | Admitting: Unknown Physician Specialty

## 2017-02-18 VITALS — BP 130/84 | HR 72 | Temp 98.2°F | Wt 205.0 lb

## 2017-02-18 DIAGNOSIS — R490 Dysphonia: Secondary | ICD-10-CM | POA: Diagnosis not present

## 2017-02-18 DIAGNOSIS — E039 Hypothyroidism, unspecified: Secondary | ICD-10-CM

## 2017-02-18 DIAGNOSIS — M25572 Pain in left ankle and joints of left foot: Secondary | ICD-10-CM | POA: Diagnosis not present

## 2017-02-18 DIAGNOSIS — Z5181 Encounter for therapeutic drug level monitoring: Secondary | ICD-10-CM | POA: Diagnosis not present

## 2017-02-18 MED ORDER — INDOMETHACIN 50 MG PO CAPS: 50.0000 mg | ORAL_CAPSULE | Freq: Three times a day (TID) | ORAL | 0 refills | Status: DC | PRN

## 2017-02-18 NOTE — Progress Notes (Signed)
BP 130/84 (BP Location: Right Arm, Cuff Size: Normal)   Pulse 72   Temp 98.2 F (36.8 C) (Oral)   Wt 205 lb (93 kg) Comment: pt reported  SpO2 100%   BMI 31.17 kg/m    Subjective:    Patient ID: Katherine James, female    DOB: 12/05/1960, 56 y.o.   MRN: [id number]  HPI: Katherine James is a 56 y.o. female  Chief Complaint  Patient presents with  . Ankle Pain    pt states her left ankle has been hurting for 2 weeks    See last note in which pt has an over 1 month history of sinus issues.  Started Amoxil, stopped it, took a Z pack, completed the course of Amoxil.  Had 2 rounds of steroids finishing 2 weeks ago.  States the fever is stopped and voice comes and goes, but mostly hoarse.  Sister who is a Marine scientist suggests checking Lithium and thyroid. She would also like a CBC   Left ankle pain Started hurting while waiting for sister at the airport.  Got worse as the week progressed.  It is not as swollen or limping as much.  Wearing compression hose.  She is concerned about walking in the [place] airport for an upcoming trip.  Took Tylenol, ASA, and Ibuprofen.    Relevant past medical, surgical, family and social history reviewed and updated as indicated. Interim medical history since our last visit reviewed. Allergies and medications reviewed and updated.  Review of Systems  Respiratory: Negative.   Gastrointestinal: Negative.   Genitourinary: Negative.   Neurological: Negative.     Per HPI unless specifically indicated above     Objective:    BP 130/84 (BP Location: Right Arm, Cuff Size: Normal)   Pulse 72   Temp 98.2 F (36.8 C) (Oral)   Wt 205 lb (93 kg) Comment: pt reported  SpO2 100%   BMI 31.17 kg/m   Wt Readings from Last 3 Encounters:  02/18/17 205 lb (93 kg)  01/22/17 205 lb (93 kg)  01/16/17 205 lb (93 kg)    Physical Exam  Constitutional: She is oriented to person, place, and time. She appears well-developed and well-nourished. No distress.  HENT:  Head:  Normocephalic and atraumatic.  Eyes: Conjunctivae and lids are normal. Right eye exhibits no discharge. Left eye exhibits no discharge. No scleral icterus.  Neck: Normal range of motion. Neck supple. No JVD present. Carotid bruit is not present.  Cardiovascular: Normal rate, regular rhythm and normal heart sounds.  Pulmonary/Chest: Effort normal and breath sounds normal.  Abdominal: Normal appearance. There is no splenomegaly or hepatomegaly.  Musculoskeletal: Normal range of motion.  Neurological: She is alert and oriented to person, place, and time.  Skin: Skin is warm, dry and intact. No rash noted. No pallor.  Psychiatric: She has a normal mood and affect. Her behavior is normal. Judgment and thought content normal.    Results for orders placed or performed in visit on 01/22/17  Veritor Flu A/B Waived  Result Value Ref Range   Influenza A Negative Negative   Influenza B Negative Negative      Assessment & Plan:   Problem List Items Addressed This Visit      Unprioritized   Hypothyroid - Primary   Relevant Orders   TSH    Other Visit Diagnoses    Hoarse voice quality       Suspect related to allergies.  Check CBC for further evaluation and thyroid levels  Relevant Orders   CBC with Differential/Platelet   Medication monitoring encounter       Check Lithium levels   Relevant Orders   Lithium level   Acute left ankle pain       I suspect gout.  Severe pain happened following stopping Prednisone.  Rx or Indocin if needed.  Note multiple NSAID allergies which is intolerance   Relevant Orders   Uric acid       Follow up plan: Return if symptoms worsen or fail to improve.

## 2017-02-19 LAB — TSH: TSH: 2.07 u[IU]/mL (ref 0.450–4.500)

## 2017-02-19 LAB — LITHIUM LEVEL: Lithium Lvl: 0.9 mmol/L (ref 0.6–1.2)

## 2017-02-19 LAB — CBC WITH DIFFERENTIAL/PLATELET
BASOS: 0 %
Basophils Absolute: 0 10*3/uL (ref 0.0–0.2)
EOS (ABSOLUTE): 0.2 10*3/uL (ref 0.0–0.4)
Eos: 3 %
HEMATOCRIT: 33.8 % — AB (ref 34.0–46.6)
Hemoglobin: 11.4 g/dL (ref 11.1–15.9)
IMMATURE GRANS (ABS): 0 10*3/uL (ref 0.0–0.1)
Immature Granulocytes: 0 %
Lymphocytes Absolute: 1.1 10*3/uL (ref 0.7–3.1)
Lymphs: 13 %
MCH: 32.4 pg (ref 26.6–33.0)
MCHC: 33.7 g/dL (ref 31.5–35.7)
MCV: 96 fL (ref 79–97)
MONOS ABS: 0.4 10*3/uL (ref 0.1–0.9)
Monocytes: 5 %
NEUTROS ABS: 6.3 10*3/uL (ref 1.4–7.0)
Neutrophils: 79 %
PLATELETS: 319 10*3/uL (ref 150–379)
RBC: 3.52 x10E6/uL — ABNORMAL LOW (ref 3.77–5.28)
RDW: 13.2 % (ref 12.3–15.4)
WBC: 8 10*3/uL (ref 3.4–10.8)

## 2017-02-19 LAB — URIC ACID: Uric Acid: 5.6 mg/dL (ref 2.5–7.1)

## 2017-02-20 ENCOUNTER — Encounter: Payer: Self-pay | Admitting: Unknown Physician Specialty

## 2017-02-21 NOTE — Telephone Encounter (Signed)
Copied from Katherine James [id number]. Topic: Quick Communication - Lab Results >> Feb 21, 2017  2:00 PM Lennox Solders wrote: Pt would like blood work results from 02-18-17

## 2017-02-22 NOTE — Telephone Encounter (Signed)
Letter sent to patient 02/20/17 with results. Read the patient the providers message on the letter regarding results.

## 2017-02-22 NOTE — Telephone Encounter (Signed)
Copied from [place] [id number]. Topic: Quick Communication - Lab Results >> Feb 21, 2017  2:00 PM Lennox Solders wrote: Pt would like blood work results from 02-18-17

## 2017-03-11 DIAGNOSIS — E063 Autoimmune thyroiditis: Secondary | ICD-10-CM | POA: Diagnosis not present

## 2017-03-11 DIAGNOSIS — R7989 Other specified abnormal findings of blood chemistry: Secondary | ICD-10-CM | POA: Diagnosis not present

## 2017-03-11 DIAGNOSIS — R5381 Other malaise: Secondary | ICD-10-CM | POA: Diagnosis not present

## 2017-03-13 DIAGNOSIS — F3181 Bipolar II disorder: Secondary | ICD-10-CM | POA: Diagnosis not present

## 2017-03-14 DIAGNOSIS — R5382 Chronic fatigue, unspecified: Secondary | ICD-10-CM | POA: Diagnosis not present

## 2017-03-14 DIAGNOSIS — Z1211 Encounter for screening for malignant neoplasm of colon: Secondary | ICD-10-CM | POA: Diagnosis not present

## 2017-03-14 DIAGNOSIS — M797 Fibromyalgia: Secondary | ICD-10-CM | POA: Diagnosis not present

## 2017-03-14 DIAGNOSIS — K5901 Slow transit constipation: Secondary | ICD-10-CM | POA: Diagnosis not present

## 2017-03-18 DIAGNOSIS — F319 Bipolar disorder, unspecified: Secondary | ICD-10-CM | POA: Diagnosis not present

## 2017-03-18 DIAGNOSIS — M791 Myalgia, unspecified site: Secondary | ICD-10-CM | POA: Diagnosis not present

## 2017-03-18 DIAGNOSIS — G894 Chronic pain syndrome: Secondary | ICD-10-CM | POA: Diagnosis not present

## 2017-03-18 DIAGNOSIS — F1120 Opioid dependence, uncomplicated: Secondary | ICD-10-CM | POA: Diagnosis not present

## 2017-03-18 DIAGNOSIS — M2669 Other specified disorders of temporomandibular joint: Secondary | ICD-10-CM | POA: Diagnosis not present

## 2017-04-04 DIAGNOSIS — R7989 Other specified abnormal findings of blood chemistry: Secondary | ICD-10-CM | POA: Diagnosis not present

## 2017-04-04 DIAGNOSIS — R5381 Other malaise: Secondary | ICD-10-CM | POA: Diagnosis not present

## 2017-04-04 DIAGNOSIS — E875 Hyperkalemia: Secondary | ICD-10-CM | POA: Diagnosis not present

## 2017-04-17 DIAGNOSIS — R7989 Other specified abnormal findings of blood chemistry: Secondary | ICD-10-CM | POA: Diagnosis not present

## 2017-04-17 DIAGNOSIS — E875 Hyperkalemia: Secondary | ICD-10-CM | POA: Diagnosis not present

## 2017-05-13 DIAGNOSIS — G894 Chronic pain syndrome: Secondary | ICD-10-CM | POA: Diagnosis not present

## 2017-05-13 DIAGNOSIS — E049 Nontoxic goiter, unspecified: Secondary | ICD-10-CM | POA: Diagnosis not present

## 2017-05-13 DIAGNOSIS — G8929 Other chronic pain: Secondary | ICD-10-CM | POA: Diagnosis not present

## 2017-05-13 DIAGNOSIS — M791 Myalgia, unspecified site: Secondary | ICD-10-CM | POA: Diagnosis not present

## 2017-05-13 DIAGNOSIS — F319 Bipolar disorder, unspecified: Secondary | ICD-10-CM | POA: Diagnosis not present

## 2017-05-13 DIAGNOSIS — R5381 Other malaise: Secondary | ICD-10-CM | POA: Diagnosis not present

## 2017-05-13 DIAGNOSIS — Z79899 Other long term (current) drug therapy: Secondary | ICD-10-CM | POA: Diagnosis not present

## 2017-05-13 DIAGNOSIS — F1120 Opioid dependence, uncomplicated: Secondary | ICD-10-CM | POA: Diagnosis not present

## 2017-05-13 DIAGNOSIS — M2669 Other specified disorders of temporomandibular joint: Secondary | ICD-10-CM | POA: Diagnosis not present

## 2017-05-13 DIAGNOSIS — I10 Essential (primary) hypertension: Secondary | ICD-10-CM | POA: Diagnosis not present

## 2017-05-13 DIAGNOSIS — E063 Autoimmune thyroiditis: Secondary | ICD-10-CM | POA: Diagnosis not present

## 2017-05-14 ENCOUNTER — Other Ambulatory Visit: Payer: Self-pay | Admitting: Family Medicine

## 2017-05-14 DIAGNOSIS — R945 Abnormal results of liver function studies: Secondary | ICD-10-CM

## 2017-05-14 DIAGNOSIS — R7989 Other specified abnormal findings of blood chemistry: Secondary | ICD-10-CM

## 2017-05-16 ENCOUNTER — Ambulatory Visit
Admission: RE | Admit: 2017-05-16 | Discharge: 2017-05-16 | Disposition: A | Discharge status: Home / Self Care | Payer: PPO | Source: Ambulatory Visit | Attending: Family Medicine | Admitting: Family Medicine

## 2017-05-16 DIAGNOSIS — R945 Abnormal results of liver function studies: Secondary | ICD-10-CM | POA: Insufficient documentation

## 2017-05-16 DIAGNOSIS — R7989 Other specified abnormal findings of blood chemistry: Secondary | ICD-10-CM

## 2017-06-04 DIAGNOSIS — F3181 Bipolar II disorder: Secondary | ICD-10-CM | POA: Diagnosis not present

## 2017-06-11 DIAGNOSIS — H43812 Vitreous degeneration, left eye: Secondary | ICD-10-CM | POA: Diagnosis not present

## 2017-07-09 ENCOUNTER — Encounter: Payer: Self-pay | Admitting: Emergency Medicine

## 2017-07-10 ENCOUNTER — Other Ambulatory Visit: Payer: Self-pay

## 2017-07-10 ENCOUNTER — Ambulatory Visit: Payer: PPO | Admitting: Anesthesiology

## 2017-07-10 ENCOUNTER — Encounter: Payer: Self-pay | Admitting: *Deleted

## 2017-07-10 ENCOUNTER — Encounter
Admission: RE | Disposition: A | Discharge status: Home / Self Care | Payer: Self-pay | Source: Ambulatory Visit | Attending: Unknown Physician Specialty

## 2017-07-10 ENCOUNTER — Ambulatory Visit
Admission: RE | Admit: 2017-07-10 | Discharge: 2017-07-10 | Disposition: A | Discharge status: Home / Self Care | Payer: PPO | Source: Ambulatory Visit | Attending: Unknown Physician Specialty | Admitting: Unknown Physician Specialty

## 2017-07-10 DIAGNOSIS — F319 Bipolar disorder, unspecified: Secondary | ICD-10-CM | POA: Insufficient documentation

## 2017-07-10 DIAGNOSIS — K640 First degree hemorrhoids: Secondary | ICD-10-CM | POA: Diagnosis not present

## 2017-07-10 DIAGNOSIS — G4733 Obstructive sleep apnea (adult) (pediatric): Secondary | ICD-10-CM | POA: Diagnosis not present

## 2017-07-10 DIAGNOSIS — Z9989 Dependence on other enabling machines and devices: Secondary | ICD-10-CM | POA: Diagnosis not present

## 2017-07-10 DIAGNOSIS — K5730 Diverticulosis of large intestine without perforation or abscess without bleeding: Secondary | ICD-10-CM | POA: Insufficient documentation

## 2017-07-10 DIAGNOSIS — Z79899 Other long term (current) drug therapy: Secondary | ICD-10-CM | POA: Diagnosis not present

## 2017-07-10 DIAGNOSIS — Z1211 Encounter for screening for malignant neoplasm of colon: Secondary | ICD-10-CM | POA: Diagnosis not present

## 2017-07-10 HISTORY — PX: COLONOSCOPY WITH PROPOFOL: SHX5780

## 2017-07-10 SURGERY — COLONOSCOPY WITH PROPOFOL
Anesthesia: General

## 2017-07-10 MED ORDER — PROPOFOL 500 MG/50ML IV EMUL
INTRAVENOUS | Status: DC | PRN
  Administered 2017-07-10: 160 ug/kg/min via INTRAVENOUS

## 2017-07-10 MED ORDER — PROPOFOL 10 MG/ML IV BOLUS
INTRAVENOUS | Status: DC | PRN
  Administered 2017-07-10: 100 mg via INTRAVENOUS

## 2017-07-10 MED ORDER — SODIUM CHLORIDE 0.9 % IV SOLN
INTRAVENOUS | Status: DC
  Administered 2017-07-10: 14:00:00 via INTRAVENOUS

## 2017-07-10 MED ORDER — LIDOCAINE 2% (20 MG/ML) 5 ML SYRINGE
INTRAMUSCULAR | Status: DC | PRN
  Administered 2017-07-10: 30 mg via INTRAVENOUS

## 2017-07-10 MED ORDER — PROPOFOL 500 MG/50ML IV EMUL
INTRAVENOUS | Status: AC
  Filled 2017-07-10: qty 100

## 2017-07-10 MED ORDER — FENTANYL CITRATE (PF) 100 MCG/2ML IJ SOLN
INTRAMUSCULAR | Status: DC | PRN
  Administered 2017-07-10 (×2): 50 ug via INTRAVENOUS

## 2017-07-10 MED ORDER — FENTANYL CITRATE (PF) 100 MCG/2ML IJ SOLN
INTRAMUSCULAR | Status: AC
  Filled 2017-07-10: qty 2

## 2017-07-10 MED ORDER — SODIUM CHLORIDE 0.9 % IV SOLN: INTRAVENOUS | Status: DC

## 2017-07-10 NOTE — H&P (Signed)
Primary Care Physician:  Karle Starch, MD Primary Gastroenterologist:  Dr. Vira Agar  Pre-Procedure History & Physical: HPI:  Katherine James is a 57 y.o. female is here for an colonoscopy. For screening exam.   Past Medical History:  Diagnosis Date  . Adrenal failure ([place])    now classified as adrenal fatigue  . Allergy   . Bipolar 1 disorder ([place])   . Chondromalacia   . Fatty liver   . Fibromyalgia   . Hypothyroidism   . OSA (obstructive sleep apnea)    cpap  . TMJ syndrome     Past Surgical History:  Procedure Laterality Date  . ABDOMINAL HYSTERECTOMY  1998  . bilateral breast reduction  1999  . bilateral salpingoopherectomy  2003  . [place]  . LIGAMENT REPAIR  1982   left ankle  . LIPOSUCTION  1999   abdomen  . PLANTAR FASCIA RELEASE  1994, 1995   bilateral  . REDUCTION MAMMAPLASTY Bilateral 1999  . TONSILLECTOMY  1978  . TUBAL LIGATION  1996    Prior to Admission medications   Medication Sig Start Date End Date Taking? Authorizing Provider  carisoprodol (SOMA) 350 MG tablet Take 3 tablets daily as needed for muscle spasms   Yes [provider]  fentaNYL (DURAGESIC - DOSED MCG/HR) 12 MCG/HR  02/14/17  Yes [provider]  lamoTRIgine (LAMICTAL) 200 MG tablet Take 300 mg by mouth daily.   Yes [provider]  lithium carbonate 300 MG capsule Take 4 capsules daily at bedtime   Yes [provider]  mupirocin ointment (BACTROBAN) 2 % Place 1 application into the nose 2 (two) times daily.   Yes [provider]  ondansetron (ZOFRAN) 4 MG tablet Take 4 mg by mouth every 8 (eight) hours as needed for nausea or vomiting.   Yes [provider]  oxyCODONE (OXY IR/ROXICODONE) 5 MG immediate release tablet Take 10 mg daily as needed by mouth for severe pain or breakthrough pain.    Yes [provider]  pregabalin (LYRICA) 25 MG capsule Take 25 mg by mouth 2 (two)  times daily.   Yes [provider]  progesterone (PROMETRIUM) 200 MG capsule Take 200 mg by mouth daily.   Yes [provider]  QUEtiapine (SEROQUEL) 100 MG tablet Take 100 mg by mouth at bedtime.   Yes [provider]  UNABLE TO FIND DHEA SR (compound)- take 17 mg (1 tablet) daily except Saturday and Sunday   Yes [provider]  UNABLE TO FIND Hormone Topical Cream (Compound): Bi-Est (8:2) 1 mg, apply 1 gram every morning   Yes [provider]  UNABLE TO FIND Nature's Thryoid- take 3 g M, W and F, and 2 g every other day   Yes [provider]  indomethacin (INDOCIN) 50 MG capsule Take 1 capsule (50 mg total) by mouth 3 (three) times daily as needed. 02/18/17   Kathrine Haddock, NP  lidocaine (XYLOCAINE) 2 % solution Use as directed 15 mLs in the mouth or throat as needed for mouth pain.    [provider]  nystatin (MYCOSTATIN) 100000 UNIT/ML suspension Take 5 mLs (500,000 Units total) by mouth 4 (four) times daily. Patient not taking: Reported on 07/10/2017 01/21/17   Volney American, PA-C  UNABLE TO FIND Fentenyl Pain Patch, apply one 12 mcg patch every 3 days for chronic pain    [provider]  UNABLE TO FIND Progesterone SR 300 mg (compound), take 1  tablet daily at bedtime    [provider]  UNABLE TO FIND Seroquel (Compound)- 8.3 mg per mL, take 1 mL daily at bedtime    [provider]  UNABLE TO FIND Pregnenolone 90 mg (compound), take 1 tablet daily    [provider]    Allergies as of 03/15/2017 - Review Complete 02/18/2017  Allergen Reaction Noted  . Alprazolam    . Amoxicillin-pot clavulanate    . Celecoxib    . Chlorpromazine hcl    . Codeine    . Depakote [divalproex sodium] Nausea Only 02/18/2012  . Desipramine  02/18/2012  . Diclofenac sodium    . Doxycycline    . Etodolac    . Fluoxetine hcl    . Fluticasone propionate    . Ibuprofen    . Influenza vaccines   08/20/2012  . Iodine  02/18/2012  . Ketorolac tromethamine    . Levofloxacin    . Meperidine hcl    . Nabumetone    . Naproxen    . Neurontin [gabapentin]  02/18/2012  . Oxaprozin    . Paroxetine    . Phenergan [promethazine hcl]  02/18/2012  . Piroxicam    . Pneumococcal vaccines  08/20/2012  . Rofecoxib    . Skelaxin [metaxalone]  02/18/2012  . Sulfamethoxazole-trimethoprim    . Sulindac    . Symbicort [budesonide-formoterol fumarate] Other (See Comments) 01/22/2017  . Terfenadine    . Tetracycline    . Tramadol hcl    . Wellbutrin [bupropion]  02/18/2012    Family History  Problem Relation Age of Onset  . Allergies Sister   . Asthma Unknown        nephew  . Heart disease Father   . Skin cancer Father   . Parkinson's disease Father   . Skin cancer Mother   . Stroke Mother   . Obesity Brother   . Congestive Heart Failure Maternal Grandmother   . Stroke Maternal Grandfather   . Stomach cancer Neg Hx   . Colon cancer Neg Hx     Social History   Socioeconomic History  . Marital status: Married    Spouse name: Not on file  . Number of children: 0  . Years of education: Not on file  . Highest education level: Not on file  Occupational History  . Not on file  Social Needs  . Financial resource strain: Not on file  . Food insecurity:    Worry: Not on file    Inability: Not on file  . Transportation needs:    Medical: Not on file    Non-medical: Not on file  Tobacco Use  . Smoking status: Never Smoker  . Smokeless tobacco: Never Used  . Tobacco comment: Pt was light smoker, socially  Substance and Sexual Activity  . Alcohol use: Yes    Comment: rare  . Drug use: No  . Sexual activity: Not on file  Lifestyle  . Physical activity:    Days per week: Not on file    Minutes per session: Not on file  . Stress: Not on file  Relationships  . Social connections:    Talks on phone: Not on file    Gets together: Not on file    Attends religious service: Not  on file    Active member of club or organization: Not on file    Attends meetings of clubs or organizations: Not on file    Relationship status: Not on file  .  Intimate partner violence:    Fear of current or ex partner: Not on file    Emotionally abused: Not on file    Physically abused: Not on file    Forced sexual activity: Not on file  Other Topics Concern  . Not on file  Social History Narrative  . Not on file    Review of Systems: See HPI, otherwise negative ROS  Physical Exam: BP 125/90   Temp 99.5 F (37.5 C) (Tympanic)   Resp 20   Ht 5\' 8"  (1.727 m)   Wt 93 kg (205 lb)   SpO2 100%   BMI 31.17 kg/m  General:   Alert,  pleasant and cooperative in NAD Head:  Normocephalic and atraumatic. Neck:  Supple; no masses or thyromegaly. Lungs:  Clear throughout to auscultation.    Heart:  Regular rate and rhythm. Abdomen:  Soft, nontender and nondistended. Normal bowel sounds, without guarding, and without rebound.   Neurologic:  Alert and  oriented x4;  grossly normal neurologically.  Impression/Plan: Nusayba Cadenas is here for an colonoscopy to be performed for screening exam.  Risks, benefits, limitations, and alternatives regarding  colonoscopy have been reviewed with the patient.  Questions have been answered.  All parties agreeable.   Gaylyn Cheers, MD  07/10/2017, 2:18 PM

## 2017-07-10 NOTE — Anesthesia Post-op Follow-up Note (Signed)
Anesthesia QCDR form completed.        

## 2017-07-10 NOTE — Op Note (Signed)
[facility] Gastroenterology Patient Name: Katherine James Procedure Date: 07/10/2017 2:20 PM MRN: [id number] Account #: [id number] Date of Birth: October 12, 1960 Admit Type: Outpatient Age: 57 Room: [facility] ENDO ROOM 3 Gender: Female Note Status: Finalized Procedure:            Colonoscopy Indications:          Screening for colorectal malignant neoplasm Providers:            Manya Silvas, MD Referring MD:         Cherie Dark. Augoustides MD (Referring MD) Medicines:            Propofol per Anesthesia Complications:        No immediate complications. Procedure:            Pre-Anesthesia Assessment:                       - After reviewing the risks and benefits, the patient                        was deemed in satisfactory condition to undergo the                        procedure.                       After obtaining informed consent, the colonoscope was                        passed under direct vision. Throughout the procedure,                        the patient's blood pressure, pulse, and oxygen                        saturations were monitored continuously. The                        Colonoscope was introduced through the anus and                        advanced to the the cecum, identified by appendiceal                        orifice and ileocecal valve. The colonoscopy was                        somewhat difficult due to significant looping and a                        tortuous colon. Successful completion of the procedure                        was aided by applying abdominal pressure. The patient                        tolerated the procedure well. The quality of the bowel                        preparation was good. Findings:      A few small-mouthed diverticula were found in the descending colon and  transverse colon. One diverticulum appeared to have a bit of       infection/exudate.      No polyps seen.      Internal hemorrhoids were found  during endoscopy. The hemorrhoids were       small and Grade I (internal hemorrhoids that do not prolapse). Impression:           - Diverticulosis in the descending colon and in the                        transverse colon.                       - Internal hemorrhoids.                       - No specimens collected. Recommendation:       - The findings and recommendations were discussed with                        the patient. Manya Silvas, MD 07/10/2017 3:04:26 PM This report has been signed electronically. Number of Addenda: 0 Note Initiated On: 07/10/2017 2:20 PM Scope Withdrawal Time: 0 hours 15 minutes 41 seconds  Total Procedure Duration: 0 hours 28 minutes 28 seconds       [facility]

## 2017-07-10 NOTE — Transfer of Care (Signed)
Immediate Anesthesia Transfer of Care Note  Patient: Katherine James  Procedure(s) Performed: COLONOSCOPY WITH PROPOFOL (N/A )  Patient Location: PACU and Endoscopy Unit  Anesthesia Type:General  Level of Consciousness: awake, drowsy and patient cooperative  Airway & Oxygen Therapy: Patient Spontanous Breathing and Patient connected to nasal cannula oxygen  Post-op Assessment: Report given to RN and Post -op Vital signs reviewed and stable  Post vital signs: Reviewed and stable  Last Vitals:  Vitals Value Taken Time  BP    Temp    Pulse    Resp    SpO2      Last Pain:  Vitals:   07/10/17 1352  TempSrc: Tympanic         Complications: No apparent anesthesia complications

## 2017-07-10 NOTE — Anesthesia Preprocedure Evaluation (Signed)
Anesthesia Evaluation  Patient identified by MRN, date of birth, ID band Patient awake    Reviewed: Allergy & Precautions, H&P , NPO status , Patient's Chart, lab work & pertinent test results, reviewed documented beta blocker date and time   Airway Mallampati: II   Neck ROM: full    Dental  (+) Poor Dentition   Pulmonary neg pulmonary ROS, sleep apnea and Continuous Positive Airway Pressure Ventilation ,    Pulmonary exam normal        Cardiovascular Exercise Tolerance: Poor negative cardio ROS Normal cardiovascular exam Rhythm:regular Rate:Normal     Neuro/Psych PSYCHIATRIC DISORDERS Bipolar Disorder  Neuromuscular disease negative neurological ROS  negative psych ROS   GI/Hepatic negative GI ROS, Neg liver ROS,   Endo/Other  negative endocrine ROSHypothyroidism   Renal/GU negative Renal ROS  negative genitourinary   Musculoskeletal   Abdominal   Peds  Hematology negative hematology ROS (+)   Anesthesia Other Findings Past Medical History: No date: Adrenal failure ([facility])     Comment:  now classified as adrenal fatigue No date: Allergy No date: Bipolar 1 disorder ([place]) No date: Chondromalacia No date: Fatty liver No date: Fibromyalgia No date: Hypothyroidism No date: OSA (obstructive sleep apnea)     Comment:  cpap No date: TMJ syndrome Past Surgical History: 1998: ABDOMINAL HYSTERECTOMY 1999: bilateral breast reduction 2003: bilateral salpingoopherectomy 1996: [place]: LIGAMENT REPAIR     Comment:  left ankle 1999: LIPOSUCTION     Comment:  abdomen 1994, 1995: PLANTAR FASCIA RELEASE     Comment:  bilateral 1999: REDUCTION MAMMAPLASTY; Bilateral 1978: TONSILLECTOMY 1996: TUBAL LIGATION BMI    Body Mass Index:  31.17 kg/m     Reproductive/Obstetrics negative OB ROS                             Anesthesia Physical Anesthesia  Plan  ASA: III  Anesthesia Plan: General   Post-op Pain Management:    Induction:   PONV Risk Score and Plan:   Airway Management Planned:   Additional Equipment:   Intra-op Plan:   Post-operative Plan:   Informed Consent: I have reviewed the patients History and Physical, chart, labs and discussed the procedure including the risks, benefits and alternatives for the proposed anesthesia with the patient or authorized representative who has indicated his/her understanding and acceptance.   Dental Advisory Given  Plan Discussed with: CRNA  Anesthesia Plan Comments:         Anesthesia Quick Evaluation

## 2017-07-15 DIAGNOSIS — F319 Bipolar disorder, unspecified: Secondary | ICD-10-CM | POA: Diagnosis not present

## 2017-07-15 DIAGNOSIS — M791 Myalgia, unspecified site: Secondary | ICD-10-CM | POA: Diagnosis not present

## 2017-07-15 DIAGNOSIS — Z79899 Other long term (current) drug therapy: Secondary | ICD-10-CM | POA: Diagnosis not present

## 2017-07-15 DIAGNOSIS — G894 Chronic pain syndrome: Secondary | ICD-10-CM | POA: Diagnosis not present

## 2017-07-15 DIAGNOSIS — F1120 Opioid dependence, uncomplicated: Secondary | ICD-10-CM | POA: Diagnosis not present

## 2017-07-15 DIAGNOSIS — M2669 Other specified disorders of temporomandibular joint: Secondary | ICD-10-CM | POA: Diagnosis not present

## 2017-07-17 NOTE — Anesthesia Postprocedure Evaluation (Signed)
Anesthesia Post Note  Patient: Katherine James  Procedure(s) Performed: COLONOSCOPY WITH PROPOFOL (N/A )  Patient location during evaluation: Endoscopy Anesthesia Type: General Level of consciousness: awake and alert Pain management: pain level controlled Vital Signs Assessment: post-procedure vital signs reviewed and stable Respiratory status: spontaneous breathing, nonlabored ventilation and respiratory function stable Cardiovascular status: blood pressure returned to baseline and stable Postop Assessment: no apparent nausea or vomiting Anesthetic complications: no     Last Vitals:  Vitals:   07/10/17 1510 07/10/17 1520  BP: (!) 139/93 (!) 139/99  Pulse: 60 (!) 57  Resp: 14 15  Temp:    SpO2: 100% 100%    Last Pain:  Vitals:   07/10/17 1520  TempSrc:   PainSc: 0-No pain                 Alphonsus Sias

## 2017-08-06 DIAGNOSIS — E039 Hypothyroidism, unspecified: Secondary | ICD-10-CM | POA: Diagnosis not present

## 2017-08-06 DIAGNOSIS — R8290 Unspecified abnormal findings in urine: Secondary | ICD-10-CM | POA: Diagnosis not present

## 2017-08-06 DIAGNOSIS — R300 Dysuria: Secondary | ICD-10-CM | POA: Diagnosis not present

## 2017-08-06 DIAGNOSIS — R5383 Other fatigue: Secondary | ICD-10-CM | POA: Diagnosis not present

## 2017-08-08 DIAGNOSIS — K382 Diverticulum of appendix: Secondary | ICD-10-CM | POA: Diagnosis not present

## 2017-08-08 DIAGNOSIS — E119 Type 2 diabetes mellitus without complications: Secondary | ICD-10-CM | POA: Diagnosis not present

## 2017-08-08 DIAGNOSIS — N959 Unspecified menopausal and perimenopausal disorder: Secondary | ICD-10-CM | POA: Diagnosis not present

## 2017-08-08 DIAGNOSIS — R7989 Other specified abnormal findings of blood chemistry: Secondary | ICD-10-CM | POA: Diagnosis not present

## 2017-08-08 DIAGNOSIS — R5381 Other malaise: Secondary | ICD-10-CM | POA: Diagnosis not present

## 2017-08-13 DIAGNOSIS — L720 Epidermal cyst: Secondary | ICD-10-CM | POA: Diagnosis not present

## 2017-08-13 DIAGNOSIS — L820 Inflamed seborrheic keratosis: Secondary | ICD-10-CM | POA: Diagnosis not present

## 2017-08-13 DIAGNOSIS — B353 Tinea pedis: Secondary | ICD-10-CM | POA: Diagnosis not present

## 2017-08-13 DIAGNOSIS — Z85828 Personal history of other malignant neoplasm of skin: Secondary | ICD-10-CM | POA: Diagnosis not present

## 2017-08-13 DIAGNOSIS — B351 Tinea unguium: Secondary | ICD-10-CM | POA: Diagnosis not present

## 2017-08-23 DIAGNOSIS — F3181 Bipolar II disorder: Secondary | ICD-10-CM | POA: Diagnosis not present

## 2017-08-27 DIAGNOSIS — H43812 Vitreous degeneration, left eye: Secondary | ICD-10-CM | POA: Diagnosis not present

## 2017-09-11 DIAGNOSIS — M2669 Other specified disorders of temporomandibular joint: Secondary | ICD-10-CM | POA: Diagnosis not present

## 2017-09-11 DIAGNOSIS — G894 Chronic pain syndrome: Secondary | ICD-10-CM | POA: Diagnosis not present

## 2017-09-11 DIAGNOSIS — F319 Bipolar disorder, unspecified: Secondary | ICD-10-CM | POA: Diagnosis not present

## 2017-09-11 DIAGNOSIS — Z79899 Other long term (current) drug therapy: Secondary | ICD-10-CM | POA: Diagnosis not present

## 2017-09-11 DIAGNOSIS — F1120 Opioid dependence, uncomplicated: Secondary | ICD-10-CM | POA: Diagnosis not present

## 2017-11-10 DIAGNOSIS — J019 Acute sinusitis, unspecified: Secondary | ICD-10-CM | POA: Diagnosis not present

## 2017-11-11 DIAGNOSIS — Z79899 Other long term (current) drug therapy: Secondary | ICD-10-CM | POA: Diagnosis not present

## 2017-11-11 DIAGNOSIS — G894 Chronic pain syndrome: Secondary | ICD-10-CM | POA: Diagnosis not present

## 2017-11-11 DIAGNOSIS — M2669 Other specified disorders of temporomandibular joint: Secondary | ICD-10-CM | POA: Diagnosis not present

## 2017-11-11 DIAGNOSIS — F319 Bipolar disorder, unspecified: Secondary | ICD-10-CM | POA: Diagnosis not present

## 2017-11-11 DIAGNOSIS — F1120 Opioid dependence, uncomplicated: Secondary | ICD-10-CM | POA: Diagnosis not present

## 2017-11-12 DIAGNOSIS — F3181 Bipolar II disorder: Secondary | ICD-10-CM | POA: Diagnosis not present

## 2017-11-26 DIAGNOSIS — Z862 Personal history of diseases of the blood and blood-forming organs and certain disorders involving the immune mechanism: Secondary | ICD-10-CM | POA: Diagnosis not present

## 2017-11-26 DIAGNOSIS — S8000XA Contusion of unspecified knee, initial encounter: Secondary | ICD-10-CM | POA: Insufficient documentation

## 2017-12-02 DIAGNOSIS — B351 Tinea unguium: Secondary | ICD-10-CM | POA: Diagnosis not present

## 2017-12-02 DIAGNOSIS — L720 Epidermal cyst: Secondary | ICD-10-CM | POA: Diagnosis not present

## 2017-12-05 DIAGNOSIS — Z862 Personal history of diseases of the blood and blood-forming organs and certain disorders involving the immune mechanism: Secondary | ICD-10-CM | POA: Diagnosis not present

## 2017-12-09 DIAGNOSIS — L728 Other follicular cysts of the skin and subcutaneous tissue: Secondary | ICD-10-CM | POA: Diagnosis not present

## 2017-12-09 DIAGNOSIS — L720 Epidermal cyst: Secondary | ICD-10-CM | POA: Diagnosis not present

## 2017-12-09 DIAGNOSIS — R5381 Other malaise: Secondary | ICD-10-CM | POA: Diagnosis not present

## 2017-12-09 DIAGNOSIS — N959 Unspecified menopausal and perimenopausal disorder: Secondary | ICD-10-CM | POA: Diagnosis not present

## 2017-12-09 DIAGNOSIS — Z862 Personal history of diseases of the blood and blood-forming organs and certain disorders involving the immune mechanism: Secondary | ICD-10-CM | POA: Diagnosis not present

## 2017-12-09 DIAGNOSIS — E7889 Other lipoprotein metabolism disorders: Secondary | ICD-10-CM | POA: Diagnosis not present

## 2017-12-09 DIAGNOSIS — E063 Autoimmune thyroiditis: Secondary | ICD-10-CM | POA: Diagnosis not present

## 2017-12-09 DIAGNOSIS — L820 Inflamed seborrheic keratosis: Secondary | ICD-10-CM | POA: Diagnosis not present

## 2018-01-14 ENCOUNTER — Other Ambulatory Visit: Payer: Self-pay | Admitting: Family Medicine

## 2018-01-14 ENCOUNTER — Ambulatory Visit
Admission: RE | Admit: 2018-01-14 | Discharge: 2018-01-14 | Disposition: A | Discharge status: Home / Self Care | Payer: PPO | Source: Ambulatory Visit | Attending: Family Medicine | Admitting: Family Medicine

## 2018-01-14 DIAGNOSIS — D169 Benign neoplasm of bone and articular cartilage, unspecified: Secondary | ICD-10-CM

## 2018-01-14 DIAGNOSIS — R51 Headache: Secondary | ICD-10-CM | POA: Diagnosis not present

## 2018-01-14 DIAGNOSIS — Q758 Other specified congenital malformations of skull and face bones: Secondary | ICD-10-CM | POA: Diagnosis not present

## 2018-01-14 DIAGNOSIS — E039 Hypothyroidism, unspecified: Secondary | ICD-10-CM | POA: Diagnosis not present

## 2018-01-14 DIAGNOSIS — D164 Benign neoplasm of bones of skull and face: Secondary | ICD-10-CM | POA: Diagnosis not present

## 2018-01-15 DIAGNOSIS — M2669 Other specified disorders of temporomandibular joint: Secondary | ICD-10-CM | POA: Diagnosis not present

## 2018-01-15 DIAGNOSIS — G894 Chronic pain syndrome: Secondary | ICD-10-CM | POA: Diagnosis not present

## 2018-01-15 DIAGNOSIS — F319 Bipolar disorder, unspecified: Secondary | ICD-10-CM | POA: Diagnosis not present

## 2018-01-15 DIAGNOSIS — Z79899 Other long term (current) drug therapy: Secondary | ICD-10-CM | POA: Diagnosis not present

## 2018-01-15 DIAGNOSIS — F1120 Opioid dependence, uncomplicated: Secondary | ICD-10-CM | POA: Diagnosis not present

## 2018-01-20 DIAGNOSIS — L910 Hypertrophic scar: Secondary | ICD-10-CM | POA: Diagnosis not present

## 2018-01-20 DIAGNOSIS — L700 Acne vulgaris: Secondary | ICD-10-CM | POA: Diagnosis not present

## 2018-01-20 DIAGNOSIS — L905 Scar conditions and fibrosis of skin: Secondary | ICD-10-CM | POA: Diagnosis not present

## 2018-01-20 DIAGNOSIS — L820 Inflamed seborrheic keratosis: Secondary | ICD-10-CM | POA: Diagnosis not present

## 2018-01-27 DIAGNOSIS — G894 Chronic pain syndrome: Secondary | ICD-10-CM | POA: Diagnosis not present

## 2018-01-27 DIAGNOSIS — F319 Bipolar disorder, unspecified: Secondary | ICD-10-CM | POA: Diagnosis not present

## 2018-01-27 DIAGNOSIS — Z79899 Other long term (current) drug therapy: Secondary | ICD-10-CM | POA: Diagnosis not present

## 2018-01-27 DIAGNOSIS — F1120 Opioid dependence, uncomplicated: Secondary | ICD-10-CM | POA: Diagnosis not present

## 2018-01-27 DIAGNOSIS — M2669 Other specified disorders of temporomandibular joint: Secondary | ICD-10-CM | POA: Diagnosis not present

## 2018-02-06 DIAGNOSIS — R7989 Other specified abnormal findings of blood chemistry: Secondary | ICD-10-CM | POA: Diagnosis not present

## 2018-02-06 DIAGNOSIS — E039 Hypothyroidism, unspecified: Secondary | ICD-10-CM | POA: Diagnosis not present

## 2018-02-06 DIAGNOSIS — R5381 Other malaise: Secondary | ICD-10-CM | POA: Diagnosis not present

## 2018-03-04 DIAGNOSIS — Z79899 Other long term (current) drug therapy: Secondary | ICD-10-CM | POA: Diagnosis not present

## 2018-03-04 DIAGNOSIS — G894 Chronic pain syndrome: Secondary | ICD-10-CM | POA: Diagnosis not present

## 2018-03-04 DIAGNOSIS — M2669 Other specified disorders of temporomandibular joint: Secondary | ICD-10-CM | POA: Diagnosis not present

## 2018-03-04 DIAGNOSIS — F1120 Opioid dependence, uncomplicated: Secondary | ICD-10-CM | POA: Diagnosis not present

## 2018-03-04 DIAGNOSIS — F319 Bipolar disorder, unspecified: Secondary | ICD-10-CM | POA: Diagnosis not present

## 2018-03-07 ENCOUNTER — Other Ambulatory Visit: Payer: Self-pay | Admitting: Physician Assistant

## 2018-03-07 MED ORDER — AMPHETAMINE-DEXTROAMPHETAMINE 10 MG PO TABS: ORAL_TABLET | ORAL | 0 refills | Status: DC

## 2018-03-18 DIAGNOSIS — N959 Unspecified menopausal and perimenopausal disorder: Secondary | ICD-10-CM | POA: Diagnosis not present

## 2018-03-18 DIAGNOSIS — N951 Menopausal and female climacteric states: Secondary | ICD-10-CM | POA: Diagnosis not present

## 2018-03-18 DIAGNOSIS — E119 Type 2 diabetes mellitus without complications: Secondary | ICD-10-CM | POA: Diagnosis not present

## 2018-03-18 DIAGNOSIS — E063 Autoimmune thyroiditis: Secondary | ICD-10-CM | POA: Diagnosis not present

## 2018-03-18 DIAGNOSIS — R7309 Other abnormal glucose: Secondary | ICD-10-CM | POA: Diagnosis not present

## 2018-03-18 DIAGNOSIS — E782 Mixed hyperlipidemia: Secondary | ICD-10-CM | POA: Diagnosis not present

## 2018-03-20 DIAGNOSIS — Z79899 Other long term (current) drug therapy: Secondary | ICD-10-CM | POA: Diagnosis not present

## 2018-03-20 DIAGNOSIS — K5900 Constipation, unspecified: Secondary | ICD-10-CM | POA: Diagnosis not present

## 2018-03-20 DIAGNOSIS — Z885 Allergy status to narcotic agent status: Secondary | ICD-10-CM | POA: Diagnosis not present

## 2018-03-20 DIAGNOSIS — Z880 Allergy status to penicillin: Secondary | ICD-10-CM | POA: Diagnosis not present

## 2018-03-20 DIAGNOSIS — Z888 Allergy status to other drugs, medicaments and biological substances status: Secondary | ICD-10-CM | POA: Diagnosis not present

## 2018-03-20 DIAGNOSIS — E079 Disorder of thyroid, unspecified: Secondary | ICD-10-CM | POA: Diagnosis not present

## 2018-03-20 DIAGNOSIS — R160 Hepatomegaly, not elsewhere classified: Secondary | ICD-10-CM | POA: Diagnosis not present

## 2018-03-20 DIAGNOSIS — Z889 Allergy status to unspecified drugs, medicaments and biological substances status: Secondary | ICD-10-CM | POA: Diagnosis not present

## 2018-03-20 DIAGNOSIS — Z8739 Personal history of other diseases of the musculoskeletal system and connective tissue: Secondary | ICD-10-CM | POA: Diagnosis not present

## 2018-03-20 DIAGNOSIS — K838 Other specified diseases of biliary tract: Secondary | ICD-10-CM | POA: Diagnosis not present

## 2018-03-20 DIAGNOSIS — K5730 Diverticulosis of large intestine without perforation or abscess without bleeding: Secondary | ICD-10-CM | POA: Diagnosis not present

## 2018-03-20 DIAGNOSIS — Z886 Allergy status to analgesic agent status: Secondary | ICD-10-CM | POA: Diagnosis not present

## 2018-03-20 DIAGNOSIS — Z882 Allergy status to sulfonamides status: Secondary | ICD-10-CM | POA: Diagnosis not present

## 2018-03-20 DIAGNOSIS — K5792 Diverticulitis of intestine, part unspecified, without perforation or abscess without bleeding: Secondary | ICD-10-CM | POA: Diagnosis not present

## 2018-03-20 DIAGNOSIS — Z881 Allergy status to other antibiotic agents status: Secondary | ICD-10-CM | POA: Diagnosis not present

## 2018-03-21 DIAGNOSIS — F419 Anxiety disorder, unspecified: Secondary | ICD-10-CM | POA: Diagnosis not present

## 2018-03-21 DIAGNOSIS — Z930 Tracheostomy status: Secondary | ICD-10-CM | POA: Diagnosis not present

## 2018-03-21 DIAGNOSIS — F3181 Bipolar II disorder: Secondary | ICD-10-CM | POA: Diagnosis not present

## 2018-03-21 DIAGNOSIS — K5792 Diverticulitis of intestine, part unspecified, without perforation or abscess without bleeding: Secondary | ICD-10-CM | POA: Diagnosis not present

## 2018-03-21 DIAGNOSIS — G4733 Obstructive sleep apnea (adult) (pediatric): Secondary | ICD-10-CM | POA: Diagnosis not present

## 2018-03-21 DIAGNOSIS — R110 Nausea: Secondary | ICD-10-CM | POA: Diagnosis not present

## 2018-03-21 DIAGNOSIS — G8929 Other chronic pain: Secondary | ICD-10-CM | POA: Diagnosis not present

## 2018-03-21 DIAGNOSIS — E039 Hypothyroidism, unspecified: Secondary | ICD-10-CM | POA: Diagnosis not present

## 2018-03-21 DIAGNOSIS — K5732 Diverticulitis of large intestine without perforation or abscess without bleeding: Secondary | ICD-10-CM | POA: Diagnosis not present

## 2018-03-21 DIAGNOSIS — R160 Hepatomegaly, not elsewhere classified: Secondary | ICD-10-CM | POA: Diagnosis not present

## 2018-03-21 DIAGNOSIS — D649 Anemia, unspecified: Secondary | ICD-10-CM | POA: Diagnosis not present

## 2018-03-21 DIAGNOSIS — M797 Fibromyalgia: Secondary | ICD-10-CM | POA: Diagnosis not present

## 2018-03-21 DIAGNOSIS — R1031 Right lower quadrant pain: Secondary | ICD-10-CM | POA: Diagnosis not present

## 2018-03-21 DIAGNOSIS — R1032 Left lower quadrant pain: Secondary | ICD-10-CM | POA: Diagnosis not present

## 2018-03-21 DIAGNOSIS — Z885 Allergy status to narcotic agent status: Secondary | ICD-10-CM | POA: Diagnosis not present

## 2018-03-22 DIAGNOSIS — K5732 Diverticulitis of large intestine without perforation or abscess without bleeding: Secondary | ICD-10-CM | POA: Diagnosis not present

## 2018-03-23 DIAGNOSIS — K5732 Diverticulitis of large intestine without perforation or abscess without bleeding: Secondary | ICD-10-CM | POA: Diagnosis not present

## 2018-03-24 DIAGNOSIS — G8929 Other chronic pain: Secondary | ICD-10-CM | POA: Diagnosis not present

## 2018-03-24 DIAGNOSIS — M797 Fibromyalgia: Secondary | ICD-10-CM | POA: Diagnosis not present

## 2018-03-24 DIAGNOSIS — F319 Bipolar disorder, unspecified: Secondary | ICD-10-CM | POA: Diagnosis not present

## 2018-03-24 DIAGNOSIS — K5909 Other constipation: Secondary | ICD-10-CM | POA: Diagnosis not present

## 2018-03-24 DIAGNOSIS — G4733 Obstructive sleep apnea (adult) (pediatric): Secondary | ICD-10-CM | POA: Diagnosis not present

## 2018-03-24 DIAGNOSIS — K5732 Diverticulitis of large intestine without perforation or abscess without bleeding: Secondary | ICD-10-CM | POA: Diagnosis not present

## 2018-03-25 DIAGNOSIS — G8929 Other chronic pain: Secondary | ICD-10-CM | POA: Diagnosis not present

## 2018-03-25 DIAGNOSIS — K5732 Diverticulitis of large intestine without perforation or abscess without bleeding: Secondary | ICD-10-CM | POA: Diagnosis not present

## 2018-03-25 DIAGNOSIS — K5909 Other constipation: Secondary | ICD-10-CM | POA: Diagnosis not present

## 2018-03-25 DIAGNOSIS — M797 Fibromyalgia: Secondary | ICD-10-CM | POA: Diagnosis not present

## 2018-03-26 DIAGNOSIS — K5792 Diverticulitis of intestine, part unspecified, without perforation or abscess without bleeding: Secondary | ICD-10-CM | POA: Diagnosis not present

## 2018-03-26 DIAGNOSIS — K5732 Diverticulitis of large intestine without perforation or abscess without bleeding: Secondary | ICD-10-CM | POA: Diagnosis not present

## 2018-03-27 ENCOUNTER — Other Ambulatory Visit: Payer: Self-pay | Admitting: Physician Assistant

## 2018-03-27 ENCOUNTER — Ambulatory Visit: Payer: Self-pay | Admitting: Physician Assistant

## 2018-03-27 DIAGNOSIS — K5792 Diverticulitis of intestine, part unspecified, without perforation or abscess without bleeding: Secondary | ICD-10-CM | POA: Diagnosis not present

## 2018-03-27 DIAGNOSIS — K5732 Diverticulitis of large intestine without perforation or abscess without bleeding: Secondary | ICD-10-CM | POA: Diagnosis not present

## 2018-03-28 ENCOUNTER — Other Ambulatory Visit: Payer: Self-pay | Admitting: *Deleted

## 2018-03-28 DIAGNOSIS — K5792 Diverticulitis of intestine, part unspecified, without perforation or abscess without bleeding: Secondary | ICD-10-CM | POA: Diagnosis not present

## 2018-03-28 DIAGNOSIS — K5732 Diverticulitis of large intestine without perforation or abscess without bleeding: Secondary | ICD-10-CM | POA: Diagnosis not present

## 2018-03-28 MED ORDER — GENERIC EXTERNAL MEDICATION
1.00 | Status: DC
Start: 2018-03-26 — End: 2018-03-28

## 2018-03-28 MED ORDER — BISACODYL 10 MG RE SUPP
10.00 | RECTAL | Status: DC
Start: ? — End: 2018-03-28

## 2018-03-28 MED ORDER — LITHIUM CARBONATE ER 300 MG PO TBCR
1200.00 | EXTENDED_RELEASE_TABLET | ORAL | Status: DC
Start: 2018-03-26 — End: 2018-03-28

## 2018-03-28 MED ORDER — METHOCARBAMOL 500 MG PO TABS
500.00 | ORAL_TABLET | ORAL | Status: DC
Start: ? — End: 2018-03-28

## 2018-03-28 MED ORDER — LAMOTRIGINE 150 MG PO TABS
300.00 | ORAL_TABLET | ORAL | Status: DC
Start: 2018-03-27 — End: 2018-03-28

## 2018-03-28 MED ORDER — MELATONIN 3 MG PO TABS
3.00 | ORAL_TABLET | ORAL | Status: DC
Start: ? — End: 2018-03-28

## 2018-03-28 MED ORDER — OXYCODONE HCL 5 MG PO TABS
5.00 | ORAL_TABLET | ORAL | Status: DC
Start: 2018-03-26 — End: 2018-03-28

## 2018-03-28 MED ORDER — LIDOCAINE 5 % EX PTCH
1.00 | MEDICATED_PATCH | CUTANEOUS | Status: DC
Start: 2018-03-27 — End: 2018-03-28

## 2018-03-28 MED ORDER — GENERIC EXTERNAL MEDICATION
10.00 | Status: DC
Start: 2018-03-26 — End: 2018-03-28

## 2018-03-28 MED ORDER — ACETAMINOPHEN 500 MG PO TABS
1000.00 | ORAL_TABLET | ORAL | Status: DC
Start: 2018-03-26 — End: 2018-03-28

## 2018-03-28 MED ORDER — GENERIC EXTERNAL MEDICATION
3.00 | Status: DC
Start: 2018-03-26 — End: 2018-03-28

## 2018-03-28 MED ORDER — GENERIC EXTERNAL MEDICATION
85.00 | Status: DC
Start: 2018-03-27 — End: 2018-03-28

## 2018-03-28 MED ORDER — GENERIC EXTERNAL MEDICATION
400.00 | Status: DC
Start: 2018-03-26 — End: 2018-03-28

## 2018-03-28 MED ORDER — GENERIC EXTERNAL MEDICATION
2.00 | Status: DC
Start: ? — End: 2018-03-28

## 2018-03-28 MED ORDER — OXYCODONE HCL 5 MG PO TABS
5.00 | ORAL_TABLET | ORAL | Status: DC
Start: ? — End: 2018-03-28

## 2018-03-28 MED ORDER — FENTANYL 12 MCG/HR TD PT72
1.00 | MEDICATED_PATCH | TRANSDERMAL | Status: DC
Start: 2018-03-29 — End: 2018-03-28

## 2018-03-28 MED ORDER — ALUM & MAG HYDROXIDE-SIMETH 400-400-40 MG/5ML PO SUSP
30.00 | ORAL | Status: DC
Start: ? — End: 2018-03-28

## 2018-03-28 MED ORDER — GENERIC EXTERNAL MEDICATION
18.00 | Status: DC
Start: 2018-03-27 — End: 2018-03-28

## 2018-03-28 MED ORDER — GENERIC EXTERNAL MEDICATION
4.00 | Status: DC
Start: ? — End: 2018-03-28

## 2018-03-28 MED ORDER — POLYETHYLENE GLYCOL 3350 17 G PO PACK
17.00 | PACK | ORAL | Status: DC
Start: 2018-03-26 — End: 2018-03-28

## 2018-03-28 MED ORDER — GENERIC EXTERNAL MEDICATION
8.33 | Status: DC
Start: 2018-03-26 — End: 2018-03-28

## 2018-03-28 NOTE — Patient Outreach (Signed)
[facility]) Care Management  03/28/2018  Katherine James [birth year] [id number]   Transition of Care Referral   Referral Date: 03/28/2018 Referral Source: [facility] Urgent Outreach for [facility] Date of Admission: 03/21/2018 Diagnosis: "Dx Notes Dvtrcli of intest, part unsp, w/o perf or abscess w/o bleed"  Date of Discharge:Facility: [facility] James on 03/26/18. Discharged Home (no skilled needs)  Insurance: [employer]TA  Outreach attempt # 1 successful  Katherine James answered and pt heard speaking with someone else on another phone in the background Katherine James inquired if CM was calling from home health and CM confirmed with him that CM was not. CM reviewed [facility] contact for transition of care  Katherine James explained this to the patient who answered the phone speaking fast and reports being distracted related to needing to complete an iv infusion and "don't have much time"  Patient is able to verify HIPAA Reviewed and addressed Transitional of care referral with patient  Katherine James reports she is "only worried about my PICC line and this iv" She inquired if Cm was calling about "paperwork" and CM informed her Cm was not calling about paperwork. Began transition of care questions She reports she has her after summary forms but has "not reviewed them all yet"  She reports since she has arrived home on 03/26/2018 she has not made her follow up appointments at this time and denied need of assistance to make the appointments when CM offered to assist. "I will make them next week"  She reports all her medications were called in to her pharmacy, she picked them up but was only concerned about her iv infusion medicine She reports having home health visits already and that her home health RN was scheduled to return later in the evening on 03/28/2018 She informed CM she was doing well and did not need any assist from [facility] staff at this time  She disconnected the line and was heard calling for "Rob, come help  me with this."   Social Katherine James lives with Katherine James. She is independent with her care needs and denies issues with transportation to medical appointments  Conditions: diverticulitis of large intestine without perforation or abscess without bleeding, chronic pain syndrome, bipolar disorder, fibromyalgia, hypothyroidism, OSA with CPAP,   Medications: She is getting ampilliin -sulbactam 3 g iv q 6 hours for 28 doses She denies concerns with taking medications as prescribed, affording medications, side effects of medications and questions about medications   Appointments: GI Katherine James last seen 11/26/2017 D/c instructions request a follow up appointment  Advance Directives: Denies need for assist with advance directives   Consent: [facility] RN CM reviewed [facility] services with patient. Patient gave verbal consent for services. She denies need of services from [facility]/Telephonic RN CM, pharmacy, health coach, NP or SW at this time  Advised patient that other post discharge calls may occur to assess how the patient is doing following the recent hospitalization. Patient voiced understanding and was appreciative of f/u call.  Plan: [facility] RN CM will close case at this time as patient has been assessed and no needs identified/needs resolved.   Pt encouraged to return a call to [place] CM prn  [facility] RN CM sent a successful outreach letter as discussed with [facility] brochure enclosed for review  Katherine James. Katherine Hamman, RN, BSN, [place] Coordinator Office number [phone] Mobile number [phone]  Main [facility] number [phone] Fax number [phone]

## 2018-03-28 NOTE — Telephone Encounter (Signed)
Need to review paper chart  

## 2018-03-31 ENCOUNTER — Other Ambulatory Visit
Admission: RE | Admit: 2018-03-31 | Discharge: 2018-03-31 | Disposition: A | Discharge status: Home / Self Care | Payer: PPO | Source: Skilled Nursing Facility | Attending: Internal Medicine | Admitting: Internal Medicine

## 2018-03-31 ENCOUNTER — Other Ambulatory Visit: Payer: Self-pay | Admitting: Physician Assistant

## 2018-03-31 DIAGNOSIS — K5792 Diverticulitis of intestine, part unspecified, without perforation or abscess without bleeding: Secondary | ICD-10-CM | POA: Insufficient documentation

## 2018-03-31 DIAGNOSIS — K5732 Diverticulitis of large intestine without perforation or abscess without bleeding: Secondary | ICD-10-CM | POA: Insufficient documentation

## 2018-03-31 LAB — CBC WITH DIFFERENTIAL/PLATELET
Abs Immature Granulocytes: 0.04 10*3/uL (ref 0.00–0.07)
BASOS ABS: 0.1 10*3/uL (ref 0.0–0.1)
Basophils Relative: 1 %
Eosinophils Absolute: 0.2 10*3/uL (ref 0.0–0.5)
Eosinophils Relative: 2 %
HCT: 32.5 % — ABNORMAL LOW (ref 36.0–46.0)
HEMOGLOBIN: 10.8 g/dL — AB (ref 12.0–15.0)
Immature Granulocytes: 0 %
LYMPHS PCT: 24 %
Lymphs Abs: 2.4 10*3/uL (ref 0.7–4.0)
MCH: 31.9 pg (ref 26.0–34.0)
MCHC: 33.2 g/dL (ref 30.0–36.0)
MCV: 95.9 fL (ref 80.0–100.0)
Monocytes Absolute: 0.6 10*3/uL (ref 0.1–1.0)
Monocytes Relative: 6 %
Neutro Abs: 6.8 10*3/uL (ref 1.7–7.7)
Neutrophils Relative %: 67 %
Platelets: 317 10*3/uL (ref 150–400)
RBC: 3.39 MIL/uL — ABNORMAL LOW (ref 3.87–5.11)
RDW: 12.8 % (ref 11.5–15.5)
WBC: 10.1 10*3/uL (ref 4.0–10.5)
nRBC: 0 % (ref 0.0–0.2)

## 2018-03-31 LAB — COMPREHENSIVE METABOLIC PANEL
ALT: 18 U/L (ref 0–44)
ANION GAP: 7 (ref 5–15)
AST: 18 U/L (ref 15–41)
Albumin: 4.3 g/dL (ref 3.5–5.0)
Alkaline Phosphatase: 67 U/L (ref 38–126)
BUN: 17 mg/dL (ref 6–20)
CO2: 27 mmol/L (ref 22–32)
Calcium: 9.2 mg/dL (ref 8.9–10.3)
Chloride: 105 mmol/L (ref 98–111)
Creatinine, Ser: 0.64 mg/dL (ref 0.44–1.00)
GFR calc Af Amer: >60 mL/min (ref >60)
GFR calc non Af Amer: >60 mL/min (ref >60)
Glucose, Bld: 103 mg/dL — ABNORMAL HIGH (ref 70–99)
Potassium: 4 mmol/L (ref 3.5–5.1)
Sodium: 139 mmol/L (ref 135–145)
Total Bilirubin: 0.6 mg/dL (ref 0.3–1.2)
Total Protein: 6.6 g/dL (ref 6.5–8.1)

## 2018-03-31 MED ORDER — LAMOTRIGINE 200 MG PO TABS: 300.0000 mg | ORAL_TABLET | Freq: Every day | ORAL | 1 refills | Status: DC

## 2018-04-03 DIAGNOSIS — K5732 Diverticulitis of large intestine without perforation or abscess without bleeding: Secondary | ICD-10-CM | POA: Diagnosis not present

## 2018-04-03 DIAGNOSIS — K5792 Diverticulitis of intestine, part unspecified, without perforation or abscess without bleeding: Secondary | ICD-10-CM | POA: Diagnosis not present

## 2018-04-10 ENCOUNTER — Telehealth: Payer: Self-pay | Admitting: Physician Assistant

## 2018-04-10 NOTE — Telephone Encounter (Signed)
Katherine James to report that did taper of the Adderall successfully;HOWEVER, now her thyroid is messed up again and she has no energy and just wants to sit.  Will please send in a prescription for Adderall 10mg  to [place]. Next appt. 04/29/18

## 2018-04-11 ENCOUNTER — Other Ambulatory Visit: Payer: Self-pay | Admitting: Physician Assistant

## 2018-04-11 ENCOUNTER — Telehealth: Payer: Self-pay | Admitting: Physician Assistant

## 2018-04-11 MED ORDER — AMPHETAMINE-DEXTROAMPHETAMINE 10 MG PO TABS: 10.0000 mg | ORAL_TABLET | Freq: Every day | ORAL | 0 refills | Status: DC

## 2018-04-11 MED ORDER — AMPHETAMINE-DEXTROAMPHET ER 15 MG PO CP24: 15.0000 mg | ORAL_CAPSULE | ORAL | 0 refills | Status: DC

## 2018-04-11 NOTE — Telephone Encounter (Signed)
You  Called in Adderall 15 mg ER but this is too strong.  She just wanted regular Adderall 10mg .  Please call in the 10 mg.  Really wants to be able to pick up today.  Pharmacy closes at 6pm.  [facility]

## 2018-04-11 NOTE — Telephone Encounter (Signed)
Please all her pharmacy [phone]  and cancel the Adderall XR 15 mg order. Thanks.  I sent in the other Rx

## 2018-04-11 NOTE — Telephone Encounter (Signed)
Can I get her paper chart please?

## 2018-04-11 NOTE — Telephone Encounter (Signed)
Pharmacy cancelled other Rx.

## 2018-04-17 ENCOUNTER — Ambulatory Visit: Payer: PPO | Admitting: Family Medicine

## 2018-04-18 ENCOUNTER — Telehealth: Payer: Self-pay | Admitting: Physician Assistant

## 2018-04-18 DIAGNOSIS — R509 Fever, unspecified: Secondary | ICD-10-CM | POA: Diagnosis not present

## 2018-04-18 NOTE — Telephone Encounter (Signed)
Katherine James called to report that she is in a lot of pain, mostly in her back.  Can't seem to find out the cause.  Going to Urgent care today, but wanted to see if you would order labs for kidney functions and anything else you think could help narrow the cause of her pain.  She was going discuss this with the Urgent Care doctor as well.  Next appt. Is 04/29/18

## 2018-04-21 NOTE — Telephone Encounter (Signed)
Let her know I only deal with Psychiatric issues, so cannot order those tests.  I hope she was able to find answers at Urgent Care and that she feels better.

## 2018-04-23 NOTE — Telephone Encounter (Signed)
Left voicemail to call back with update

## 2018-04-24 ENCOUNTER — Other Ambulatory Visit: Payer: Self-pay | Admitting: Physician Assistant

## 2018-04-24 DIAGNOSIS — Z79899 Other long term (current) drug therapy: Secondary | ICD-10-CM

## 2018-04-24 NOTE — Telephone Encounter (Signed)
I ordered Lithium level.  For [facility]   Let her know on 03/31/18, her kidney and liver functions were nl.  It's not necessary to recheck those.

## 2018-04-24 NOTE — Telephone Encounter (Signed)
Saw her PCP Dr. Judeen Hammans [phone] today said she needs labs,he ordered thyroid panel, but says her psychiatrist is to order kidney function and she was asking about lithium level also.  Uses [facility] in [place]  Please advise

## 2018-04-25 NOTE — Telephone Encounter (Signed)
She's aware and given instructions.

## 2018-04-29 ENCOUNTER — Ambulatory Visit: Payer: PPO | Admitting: Physician Assistant

## 2018-04-29 ENCOUNTER — Encounter: Payer: Self-pay | Admitting: Physician Assistant

## 2018-04-29 DIAGNOSIS — F319 Bipolar disorder, unspecified: Secondary | ICD-10-CM

## 2018-04-29 DIAGNOSIS — G4700 Insomnia, unspecified: Secondary | ICD-10-CM

## 2018-04-29 DIAGNOSIS — F4310 Post-traumatic stress disorder, unspecified: Secondary | ICD-10-CM | POA: Diagnosis not present

## 2018-04-29 MED ORDER — LITHIUM CARBONATE 300 MG PO CAPS: 1200.0000 mg | ORAL_CAPSULE | Freq: Every day | ORAL | 1 refills | Status: DC

## 2018-04-29 MED ORDER — AMPHETAMINE-DEXTROAMPHETAMINE 15 MG PO TABS: 7.5000 mg | ORAL_TABLET | Freq: Two times a day (BID) | ORAL | 0 refills | Status: DC

## 2018-04-29 NOTE — Progress Notes (Signed)
[facility]  Patient ID: Katherine James,  MRN: [id number]  PCP: Karle Starch, MD  Date of Evaluation: 04/29/2018 Time spent:15 minutes  Chief Complaint:  Chief Complaint    Follow-up      HISTORY/CURRENT STATUS: HPI Here for routine med check.  Patient denies loss of interest in usual activities and is able to enjoy things.  Denies decreased energy or motivation.  Appetite has not changed.  No extreme sadness, tearfulness, or feelings of hopelessness.  Denies any changes in concentration, making decisions or remembering things.  Denies suicidal or homicidal thoughts.  Patient denies increased energy with decreased need for sleep, no increased talkativeness, no racing thoughts, no impulsivity or risky behaviors, no increased spending, no increased libido, no grandiosity.  Still has times where she can't concentrate and has no energy.  The Adderall XR caused H/A.  She's so tired all day and can't get going, so the Adderall plain has helped, but if she takes the 10mg  all at once, she feels too hyper.  If she splits it, she feels more energetic and get things done.  Thinks it may not be high enough dose however.  Still has a lot of pain.  But it's fairly well controlled with meds.  Sees pain management for that.  Denies muscle or joint pain, stiffness, or dystonia.  Denies dizziness, syncope, seizures, numbness, tingling, tremor, tics, unsteady gait, slurred speech, confusion.   Individual Medical History/ Review of Systems: Changes? :Yes was in hospital for diverticulitis since LOV.  Past medications for mental health diagnoses include: Provigil, Nuvigil, Lamictal, Seroquel, lithium, Ambien, Valium, trazodone, Remeron made her angry  Allergies: Alprazolam; Amoxicillin-pot clavulanate; Celecoxib; Chlorpromazine hcl; Codeine; Depakote [divalproex sodium]; Desipramine; Diclofenac sodium; Doxycycline; Etodolac; Fluoxetine hcl; Fluticasone propionate; Ibuprofen;  Influenza vaccines; Iodine; Ketorolac tromethamine; Levofloxacin; Meperidine hcl; Nabumetone; Naproxen; Neurontin [gabapentin]; Oxaprozin; Paroxetine; Phenergan [promethazine hcl]; Piroxicam; Pneumococcal vaccines; Rofecoxib; Skelaxin [metaxalone]; Sulfamethoxazole-trimethoprim; Sulfasalazine; Sulindac; Symbicort [budesonide-formoterol fumarate]; Terfenadine; Tetracycline; Tramadol hcl; and Wellbutrin [bupropion]  Current Medications:  Current Outpatient Medications:  .  carisoprodol (SOMA) 350 MG tablet, Take 3 tablets daily as needed for muscle spasms, Disp: , Rfl:  .  fentaNYL (DURAGESIC - DOSED MCG/HR) 12 MCG/HR, , Disp: , Rfl:  .  lamoTRIgine (LAMICTAL) 200 MG tablet, Take 300 mg by mouth daily., Disp: , Rfl:  .  lithium carbonate 300 MG capsule, Take 4 capsules (1,200 mg total) by mouth at bedtime. Take 4 capsules daily at bedtime, Disp: 360 capsule, Rfl: 1 .  ondansetron (ZOFRAN) 4 MG tablet, Take 4 mg by mouth every 8 (eight) hours as needed for nausea or vomiting., Disp: , Rfl:  .  oxyCODONE (OXY IR/ROXICODONE) 5 MG immediate release tablet, Take 10 mg daily as needed by mouth for severe pain or breakthrough pain. , Disp: , Rfl:  .  pregabalin (LYRICA) 25 MG capsule, Take 25 mg by mouth 2 (two) times daily., Disp: , Rfl:  .  progesterone (PROMETRIUM) 200 MG capsule, Take 200 mg by mouth daily., Disp: , Rfl:  .  UNABLE TO FIND, DHEA SR (compound)- take 17 mg (1 tablet) daily except Saturday and Sunday, Disp: , Rfl:  .  UNABLE TO FIND, Hormone Topical Cream (Compound): Bi-Est (8:2) 1 mg, apply 1 gram every morning, Disp: , Rfl:  .  UNABLE TO FIND, Nature's Thryoid- take 3 g M, W and F, and 2 g every other day, Disp: , Rfl:  .  UNABLE TO FIND, Progesterone SR 300 mg (compound), take 1 tablet daily at  bedtime, Disp: , Rfl:  .  UNABLE TO FIND, Seroquel (Compound)- 8.3 mg per mL, take 1 mL daily at bedtime, Disp: , Rfl:  .  UNABLE TO FIND, Pregnenolone 90 mg (compound), take 1 tablet daily, Disp:  , Rfl:  .  [START ON 05/05/2018] amphetamine-dextroamphetamine (ADDERALL) 15 MG tablet, Take 0.5 tablets by mouth 2 (two) times daily., Disp: 30 tablet, Rfl: 0 .  indomethacin (INDOCIN) 50 MG capsule, Take 1 capsule (50 mg total) by mouth 3 (three) times daily as needed. (Patient not taking: Reported on 04/29/2018), Disp: 30 capsule, Rfl: 0 .  lidocaine (XYLOCAINE) 2 % solution, Use as directed 15 mLs in the mouth or throat as needed for mouth pain., Disp: , Rfl:  .  mupirocin ointment (BACTROBAN) 2 %, Place 1 application into the nose 2 (two) times daily., Disp: , Rfl:  .  nystatin (MYCOSTATIN) 100000 UNIT/ML suspension, Take 5 mLs (500,000 Units total) by mouth 4 (four) times daily. (Patient not taking: Reported on 07/10/2017), Disp: 60 mL, Rfl: 1 .  QUEtiapine (SEROQUEL) 100 MG tablet, Take 100 mg by mouth at bedtime., Disp: , Rfl:  .  UNABLE TO FIND, Fentenyl Pain Patch, apply one 12 mcg patch every 3 days for chronic pain, Disp: , Rfl:  Medication Side Effects: none  Family Medical/ Social History: Changes? No  MENTAL HEALTH EXAM:  There were no vitals taken for this visit.There is no height or weight on file to calculate BMI.  General Appearance: Casual and Well Groomed  Eye Contact:  Good  Speech:  Clear and Coherent  Volume:  Normal  Mood:  Euthymic  Affect:  Appropriate  Thought Process:  Goal Directed  Orientation:  Full (Time, Place, and Person)  Thought Content: Logical   Suicidal Thoughts:  No  Homicidal Thoughts:  No  Memory:  WNL  Judgement:  Good  Insight:  Good  Psychomotor Activity:  Normal  Concentration:  Concentration: Good  Recall:  Good  Fund of Knowledge: Good  Language: Good  Assets:  Desire for Improvement  ADL's:  Intact  Cognition: WNL  Prognosis:  Good    DIAGNOSES:    ICD-10-CM   1. Bipolar I disorder ([place]) F31.9   2. Insomnia, unspecified type G47.00   3. PTSD (post-traumatic stress disorder) F43.10     Receiving Psychotherapy: No     RECOMMENDATIONS: Increase Adderall to 15 mg, one half p.o. twice daily.  We discussed the fact that many of her medications for pain can cause drowsiness, sluggishness, lethargy etc. which may be causing the need for Adderall.  However, we both agree that she really needs those medicines so in order for her to have more productive life, I believe the Adderall is necessary. Continue Lamictal 300 mg daily. Continue lithium 1200 mg nightly. Continue Seroquel compounded 8.3 mg nightly Return in 3 months or sooner as needed.   Donnal Moat, PA-C   This record has been created using [employer].  Chart creation errors have been sought, but may not always have been located and corrected. Such creation errors do not reflect on the standard of medical care.

## 2018-04-30 DIAGNOSIS — D649 Anemia, unspecified: Secondary | ICD-10-CM | POA: Diagnosis not present

## 2018-04-30 DIAGNOSIS — K5792 Diverticulitis of intestine, part unspecified, without perforation or abscess without bleeding: Secondary | ICD-10-CM | POA: Diagnosis not present

## 2018-05-01 ENCOUNTER — Telehealth: Payer: Self-pay | Admitting: Physician Assistant

## 2018-05-01 NOTE — Telephone Encounter (Signed)
It was sent on 04/24/18.  I would suggest she let me know when she is going to have the level drawn, I'll order it, and she can either come by and pick up the paper copy or we can mail it to her.

## 2018-05-01 NOTE — Telephone Encounter (Signed)
Patient called and said that [facility] could not find the lithium lab order that was suppose to be sent two days ago. If it was a standing order they never received it. However it will be awhile beforre she will get the lab test done

## 2018-05-02 DIAGNOSIS — E039 Hypothyroidism, unspecified: Secondary | ICD-10-CM | POA: Diagnosis not present

## 2018-05-02 DIAGNOSIS — Z79899 Other long term (current) drug therapy: Secondary | ICD-10-CM | POA: Diagnosis not present

## 2018-05-02 DIAGNOSIS — R5381 Other malaise: Secondary | ICD-10-CM | POA: Diagnosis not present

## 2018-05-02 DIAGNOSIS — D649 Anemia, unspecified: Secondary | ICD-10-CM | POA: Diagnosis not present

## 2018-05-08 ENCOUNTER — Ambulatory Visit: Payer: Self-pay | Admitting: *Deleted

## 2018-05-08 NOTE — Telephone Encounter (Signed)
Pt reports multiple symptoms, onset yesterday. Reports temp of 99.6 "Which is high for me, usually 97.0." Reports lightheadedness, "When turning body, feels like I keep going." Denies nausea, no new medications. Denies cold/cough, had mild sore throat yesterday, none presently.  Reports headache 5/10 "Top of head." Also states back "Ache" upper back. States took ASA and tylenol, ineffective for headache, backache. Appt made for tomorrow with R. Lane; 30 minute appt secured. Care advise given per protocol. Pt verbalizes understanding.  Reason for Disposition . [1] MODERATE dizziness (e.g., interferes with normal activities) AND [2] has NOT been evaluated by physician for this  (Exception: dizziness caused by heat exposure, sudden standing, or poor fluid intake)  Answer Assessment - Initial Assessment Questions 1. DESCRIPTION: "Describe your dizziness."     "Turning my body" floating, 2. LIGHTHEADED: "Do you feel lightheaded?" (e.g., somewhat faint, woozy, weak upon standing)     yes 3. VERTIGO: "Do you feel like either you or the room is spinning or tilting?" (i.e. vertigo)     no 4. SEVERITY: "How bad is it?"  "Do you feel like you are going to faint?" "Can you stand and walk?"   - MILD - walking normally   - MODERATE - interferes with normal activities (e.g., work, school)    - SEVERE - unable to stand, requires support to walk, feels like passing out now.     Hard to explain 5. ONSET:  "When did the dizziness begin?"     yestarday 6. AGGRAVATING FACTORS: "Does anything make it worse?" (e.g., standing, change in head position)     Moving, turning head 7. HEART RATE: "Can you tell me your heart rate?" "How many beats in 15 seconds?"  (Note: not all patients can do this)        8. CAUSE: "What do you think is causing the dizziness?"     unsure 9. RECURRENT SYMPTOM: "Have you had dizziness before?" If so, ask: "When was the last time?" "What happened that time?"    no 10. OTHER SYMPTOMS: "Do  you have any other symptoms?" (e.g., fever, chest pain, vomiting, diarrhea, bleeding)       99.6. "This is high is me, usually 97.0"  Headache top of head, 5/10, upper back ache, "Minor sore throat."  Protocols used: DIZZINESS Heidi Dach

## 2018-05-09 ENCOUNTER — Encounter: Payer: Self-pay | Admitting: Family Medicine

## 2018-05-09 ENCOUNTER — Other Ambulatory Visit: Payer: Self-pay

## 2018-05-09 ENCOUNTER — Ambulatory Visit (INDEPENDENT_AMBULATORY_CARE_PROVIDER_SITE_OTHER): Payer: PPO | Admitting: Family Medicine

## 2018-05-09 ENCOUNTER — Telehealth: Payer: Self-pay | Admitting: Family Medicine

## 2018-05-09 VITALS — BP 149/89 | HR 78 | Temp 98.4°F | Wt 200.0 lb

## 2018-05-09 DIAGNOSIS — R42 Dizziness and giddiness: Secondary | ICD-10-CM

## 2018-05-09 DIAGNOSIS — R52 Pain, unspecified: Secondary | ICD-10-CM | POA: Diagnosis not present

## 2018-05-09 DIAGNOSIS — J111 Influenza due to unidentified influenza virus with other respiratory manifestations: Secondary | ICD-10-CM

## 2018-05-09 LAB — VERITOR FLU A/B WAIVED
Influenza A: NEGATIVE
Influenza B: NEGATIVE

## 2018-05-09 MED ORDER — BALOXAVIR MARBOXIL(80 MG DOSE) 2 X 40 MG PO TBPK: 80.0000 mg | ORAL_TABLET | Freq: Once | ORAL | 0 refills | Status: AC

## 2018-05-09 MED ORDER — OSELTAMIVIR PHOSPHATE 75 MG PO CAPS: 75.0000 mg | ORAL_CAPSULE | Freq: Two times a day (BID) | ORAL | 0 refills | Status: DC

## 2018-05-09 NOTE — Telephone Encounter (Signed)
Copied from [id number] [id number]. Topic: Quick Communication - Rx Refill/Question >> May 09, 2018  3:32 PM Sheppard Coil, [employer] wrote: Medication: Baloxavir Marboxil,80 MG Dose, (XOFLUZA) 2 x 40 MG TBPK  Izora Gala from [facility].  They do not have this medication in stock and need to know what else pt can have.

## 2018-05-09 NOTE — Telephone Encounter (Signed)
She can have tamiflu or we can send to a different pharmacy

## 2018-05-09 NOTE — Telephone Encounter (Signed)
Rx sent 

## 2018-05-09 NOTE — Progress Notes (Signed)
BP (!) 149/89   Pulse 78   Temp 98.4 F (36.9 C) (Oral)   Wt 200 lb (90.7 kg)   SpO2 98%   BMI 30.41 kg/m    Subjective:    Patient ID: Katherine James, female    DOB: 10/09/1960, 58 y.o.   MRN: [id number]  HPI: Katherine James is a 58 y.o. female  Chief Complaint  Patient presents with  . Dizziness    pt states she has had dizziness, headache, and body aches since yesterday   Temp of 99.6, dizziness, headache, body aches, rhinorrhea x 1 day. Some episodes of room spinning with turning her head. Does have a hx of fibromyalgia but states this was different as far as her body aches. Denies CP, SOB, N/V/D. Has been taking OTC remedies with minimal relief. Several sick contacts.   Past Medical History:  Diagnosis Date  . Adrenal failure ([place])    now classified as adrenal fatigue  . Allergy   . Bipolar 1 disorder ([place])   . Chondromalacia   . Fatty liver   . Fibromyalgia   . Hypothyroidism   . OSA (obstructive sleep apnea)    cpap  . TMJ syndrome    Social History   Socioeconomic History  . Marital status: Married    Spouse name: Not on file  . Number of children: 0  . Years of education: Not on file  . Highest education level: Not on file  Occupational History  . Not on file  Social Needs  . Financial resource strain: Not on file  . Food insecurity:    Worry: Not on file    Inability: Not on file  . Transportation needs:    Medical: Not on file    Non-medical: Not on file  Tobacco Use  . Smoking status: Never Smoker  . Smokeless tobacco: Never Used  . Tobacco comment: Pt was light smoker, socially  Substance and Sexual Activity  . Alcohol use: Yes    Comment: rare  . Drug use: No  . Sexual activity: Not on file  Lifestyle  . Physical activity:    Days per week: Not on file    Minutes per session: Not on file  . Stress: Not on file  Relationships  . Social connections:    Talks on phone: Not on file    Gets together: Not on file    Attends religious  service: Not on file    Active member of club or organization: Not on file    Attends meetings of clubs or organizations: Not on file    Relationship status: Not on file  . Intimate partner violence:    Fear of current or ex partner: Not on file    Emotionally abused: Not on file    Physically abused: Not on file    Forced sexual activity: Not on file  Other Topics Concern  . Not on file  Social History Narrative  . Not on file    Relevant past medical, surgical, family and social history reviewed and updated as indicated. Interim medical history since our last visit reviewed. Allergies and medications reviewed and updated.  Review of Systems  Per HPI unless specifically indicated above     Objective:    BP (!) 149/89   Pulse 78   Temp 98.4 F (36.9 C) (Oral)   Wt 200 lb (90.7 kg)   SpO2 98%   BMI 30.41 kg/m   Wt Readings from Last 3 Encounters:  05/09/18 200 lb (90.7 kg)  07/10/17 205 lb (93 kg)  02/18/17 205 lb (93 kg)    Physical Exam Vitals signs and nursing note reviewed.  Constitutional:      Appearance: Normal appearance.  HENT:     Head: Atraumatic.     Right Ear: Tympanic membrane and external ear normal.     Left Ear: Tympanic membrane and external ear normal.     Nose: Congestion present.     Mouth/Throat:     Mouth: Mucous membranes are moist.     Pharynx: Posterior oropharyngeal erythema present.  Eyes:     Extraocular Movements: Extraocular movements intact.     Conjunctiva/sclera: Conjunctivae normal.  Neck:     Musculoskeletal: Normal range of motion and neck supple.  Cardiovascular:     Rate and Rhythm: Normal rate and regular rhythm.     Heart sounds: Normal heart sounds.  Pulmonary:     Effort: Pulmonary effort is normal.     Breath sounds: Normal breath sounds. No wheezing or rales.  Musculoskeletal: Normal range of motion.  Skin:    General: Skin is warm and dry.  Neurological:     General: No focal deficit present.     Mental  Status: She is alert and oriented to person, place, and time.     Motor: No weakness.     Coordination: Coordination normal.     Gait: Gait normal.  Psychiatric:        Mood and Affect: Mood normal.        Thought Content: Thought content normal.     Results for orders placed or performed in visit on 05/09/18  Veritor Flu A/B Waived  Result Value Ref Range   Influenza A Negative Negative   Influenza B Negative Negative      Assessment & Plan:   Problem List Items Addressed This Visit    None    Visit Diagnoses    Influenza    -  Primary   Rapid flu neg, but sxs consistent. Tx with xofluza and good supportive care. Strict return precautions reviewed   Relevant Orders   Veritor Flu A/B Waived (Completed)   Dizziness       Resolved today. Declines meclizine, will call if worsening again.        Follow up plan: Return if symptoms worsen or fail to improve.

## 2018-05-09 NOTE — Telephone Encounter (Signed)
Can you please send script for tamiflu to [facility]

## 2018-05-12 ENCOUNTER — Telehealth: Payer: Self-pay

## 2018-05-12 NOTE — Telephone Encounter (Signed)
Needs immediate f/u if worsening - whether here or at [facility]

## 2018-05-12 NOTE — Telephone Encounter (Signed)
Received fax from [facility] stating,   "Caller states: I saw Dr. Wilburn Mylar and dx w/ influenza and given Xofluza. I took both doses last night and now I have headache, sore throat, cough, fever, body aches, and blurred vision. Pt states the symptoms are getting worse."  Please advise. Will scan original fax.

## 2018-05-12 NOTE — Telephone Encounter (Signed)
Called and left detailed message with information on patients vm. DPR was checked.   

## 2018-05-15 ENCOUNTER — Telehealth: Payer: Self-pay | Admitting: Family Medicine

## 2018-05-15 NOTE — Telephone Encounter (Signed)
Called and talked to patient. She stated her symptoms are changing. She is developing more productive cough. But she is feeling some better. Went over criteria for testing. Pt was aware as she had already spoken to the health department about testing. Patient was advised to limit social contact and to wear a mask when she was unable to avoid going out.

## 2018-05-15 NOTE — Telephone Encounter (Signed)
Copied from [place] [id number]. Topic: Referral - Request for Referral >> May 15, 2018  3:30 PM Gustavus Messing wrote: Has patient seen PCP for this complaint? No. *If NO, is insurance requiring patient see PCP for this issue before PCP can refer them? Referral for which specialty: [facility] Preferred provider/office: [facility] Reason for referral: fever came back, spitting up mucus, mucus as throat, headache     Symptoms have been going on for more than week

## 2018-05-15 NOTE — Telephone Encounter (Signed)
Please call and triage to see if eligible for testing

## 2018-05-30 ENCOUNTER — Other Ambulatory Visit: Payer: Self-pay | Admitting: Physician Assistant

## 2018-05-30 ENCOUNTER — Telehealth: Payer: Self-pay | Admitting: Physician Assistant

## 2018-05-30 MED ORDER — AMPHETAMINE-DEXTROAMPHETAMINE 20 MG PO TABS: 10.0000 mg | ORAL_TABLET | Freq: Two times a day (BID) | ORAL | 0 refills | Status: DC

## 2018-05-30 NOTE — Telephone Encounter (Signed)
I sent it  

## 2018-05-30 NOTE — Telephone Encounter (Signed)
Katherine James to request refill on her Adderall.  It is due 06/05/18 but she wants to increase the dose to 20mg  1/2 qam, 1/2 qhs just like she has been taking the 15mg .  She has been working up to 120 hour/wk with her mom and needs some extra help.  Next appt 07/28/18.  Please sent to [facility]

## 2018-06-02 DIAGNOSIS — F1120 Opioid dependence, uncomplicated: Secondary | ICD-10-CM | POA: Diagnosis not present

## 2018-06-02 DIAGNOSIS — G894 Chronic pain syndrome: Secondary | ICD-10-CM | POA: Diagnosis not present

## 2018-06-02 DIAGNOSIS — F319 Bipolar disorder, unspecified: Secondary | ICD-10-CM | POA: Diagnosis not present

## 2018-06-02 DIAGNOSIS — M2669 Other specified disorders of temporomandibular joint: Secondary | ICD-10-CM | POA: Diagnosis not present

## 2018-06-09 DIAGNOSIS — N898 Other specified noninflammatory disorders of vagina: Secondary | ICD-10-CM | POA: Diagnosis not present

## 2018-06-24 ENCOUNTER — Other Ambulatory Visit: Payer: Self-pay | Admitting: Physician Assistant

## 2018-06-24 NOTE — Telephone Encounter (Signed)
Please submit.

## 2018-07-14 DIAGNOSIS — G894 Chronic pain syndrome: Secondary | ICD-10-CM | POA: Diagnosis not present

## 2018-07-14 DIAGNOSIS — Z79899 Other long term (current) drug therapy: Secondary | ICD-10-CM | POA: Diagnosis not present

## 2018-07-14 DIAGNOSIS — M2669 Other specified disorders of temporomandibular joint: Secondary | ICD-10-CM | POA: Diagnosis not present

## 2018-07-14 DIAGNOSIS — F1120 Opioid dependence, uncomplicated: Secondary | ICD-10-CM | POA: Diagnosis not present

## 2018-07-14 DIAGNOSIS — F319 Bipolar disorder, unspecified: Secondary | ICD-10-CM | POA: Diagnosis not present

## 2018-07-24 ENCOUNTER — Other Ambulatory Visit: Payer: Self-pay | Admitting: Physician Assistant

## 2018-07-24 NOTE — Telephone Encounter (Signed)
Has appt 06/01, last fill 05/01

## 2018-07-28 ENCOUNTER — Encounter: Payer: Self-pay | Admitting: Physician Assistant

## 2018-07-28 ENCOUNTER — Other Ambulatory Visit: Payer: Self-pay

## 2018-07-28 ENCOUNTER — Ambulatory Visit: Payer: PPO | Admitting: Physician Assistant

## 2018-07-28 DIAGNOSIS — F900 Attention-deficit hyperactivity disorder, predominantly inattentive type: Secondary | ICD-10-CM | POA: Diagnosis not present

## 2018-07-28 DIAGNOSIS — F319 Bipolar disorder, unspecified: Secondary | ICD-10-CM

## 2018-07-28 DIAGNOSIS — G4700 Insomnia, unspecified: Secondary | ICD-10-CM | POA: Diagnosis not present

## 2018-07-28 DIAGNOSIS — F4310 Post-traumatic stress disorder, unspecified: Secondary | ICD-10-CM | POA: Diagnosis not present

## 2018-07-28 MED ORDER — LAMOTRIGINE 200 MG PO TABS: 300.0000 mg | ORAL_TABLET | Freq: Every day | ORAL | 1 refills | Status: DC

## 2018-07-28 NOTE — Progress Notes (Signed)
[facility]  Patient ID: Katherine James,  MRN: [id number]  PCP: Karle Starch, MD  Date of Evaluation: 07/28/18 Time spent:15 minutes  Chief Complaint:  Chief Complaint    ADHD; Follow-up     Virtual Visit via Telephone Note  I connected with patient by a video enabled telemedicine application or telephone, with their informed consent, and verified patient privacy and that I am speaking with the correct person using two identifiers.  I am private, at [place] and the patient is home.   I discussed the limitations, risks, security and privacy concerns of performing an evaluation and management service by telephone and the availability of in person appointments. I also discussed with the patient that there may be a patient responsible charge related to this service. The patient expressed understanding and agreed to proceed.   I discussed the assessment and treatment plan with the patient. The patient was provided an opportunity to ask questions and all were answered. The patient agreed with the plan and demonstrated an understanding of the instructions.   The patient was advised to call back or seek an in-person evaluation if the symptoms worsen or if the condition fails to improve as anticipated.  I provided 15 minutes of non-face-to-face time during this encounter.  HISTORY/CURRENT STATUS: HPI For 3 month med check.   Feels pretty good for the most part.  Meds are helping her.  Still stays w/ her Mom 2 1/2 days per week.  Mom is 'fading.' Pt has forgiven her siblings 'and that feels good. Last year was hard dealing w/ them.  I feel better than I have in 8 months.'  Patient denies loss of interest in usual activities and is able to enjoy things.  Denies decreased energy or motivation.  Appetite has not changed.  No extreme sadness, tearfulness, or feelings of hopelessness.  Denies any changes in concentration, making decisions or remembering things.  Denies  suicidal or homicidal thoughts.  Patient denies increased energy with decreased need for sleep, no increased talkativeness, no racing thoughts, no impulsivity or risky behaviors, no increased spending, no increased libido, no grandiosity.  States that attention is good without easy distractibility.  Able to focus on things and finish tasks to completion.   Denies dizziness, syncope, seizures, numbness, tingling, tremor, tics, unsteady gait, slurred speech, confusion. Denies muscle or joint pain, stiffness, or dystonia.  Individual Medical History/ Review of Systems: Changes? :No    Past medications for mental health diagnoses include: Provigil, Nuvigil, Lamictal, Seroquel, lithium, Ambien, Valium, trazodone, Remeron made her angry  Allergies: Alprazolam; Amoxicillin-pot clavulanate; Celecoxib; Chlorpromazine hcl; Codeine; Depakote [divalproex sodium]; Desipramine; Diclofenac sodium; Doxycycline; Etodolac; Fluoxetine hcl; Fluticasone propionate; Ibuprofen; Influenza vaccines; Iodine; Ketorolac tromethamine; Levofloxacin; Meperidine hcl; Nabumetone; Naproxen; Neurontin [gabapentin]; Oxaprozin; Paroxetine; Phenergan [promethazine hcl]; Piroxicam; Pneumococcal vaccines; Rofecoxib; Skelaxin [metaxalone]; Sulfamethoxazole-trimethoprim; Sulfasalazine; Sulindac; Symbicort [budesonide-formoterol fumarate]; Terfenadine; Tetracycline; Tramadol hcl; and Wellbutrin [bupropion]  Current Medications:  Current Outpatient Medications:  .  amphetamine-dextroamphetamine (ADDERALL) 20 MG tablet, TAKE 1/2 TABLET (10 mg TOTAL) BY MOUTH TWICE A DAY. (Patient taking differently: Take 5 mg by mouth 4 (four) times daily. ), Disp: 30 tablet, Rfl: 0 .  carisoprodol (SOMA) 350 MG tablet, Take 3 tablets daily as needed for muscle spasms, Disp: , Rfl:  .  fentaNYL (DURAGESIC - DOSED MCG/HR) 12 MCG/HR, , Disp: , Rfl:  .  lamoTRIgine (LAMICTAL) 200 MG tablet, Take 1.5 tablets (300 mg total) by mouth daily., Disp: 135 tablet,  Rfl: 1 .  levothyroxine (SYNTHROID, LEVOTHROID)  25 MCG tablet, Take 25 mcg by mouth daily before breakfast., Disp: , Rfl:  .  lithium carbonate 300 MG capsule, Take 4 capsules (1,200 mg total) by mouth at bedtime. Take 4 capsules daily at bedtime, Disp: 360 capsule, Rfl: 1 .  ondansetron (ZOFRAN) 4 MG tablet, Take 4 mg by mouth every 8 (eight) hours as needed for nausea or vomiting., Disp: , Rfl:  .  oxyCODONE (OXY IR/ROXICODONE) 5 MG immediate release tablet, Take 10 mg daily as needed by mouth for severe pain or breakthrough pain. , Disp: , Rfl:  .  pregabalin (LYRICA) 25 MG capsule, Take 25 mg by mouth 2 (two) times daily., Disp: , Rfl:  .  progesterone (PROMETRIUM) 200 MG capsule, Take 200 mg by mouth daily., Disp: , Rfl:  .  UNABLE TO FIND, DHEA SR (compound)- take 17 mg (1 tablet) daily except Saturday and Sunday, Disp: , Rfl:  .  UNABLE TO FIND, Hormone Topical Cream (Compound): Bi-Est (8:2) 1 mg, apply 1 gram every morning, Disp: , Rfl:  .  UNABLE TO FIND, Fentenyl Pain Patch, apply one 12 mcg patch every 3 days for chronic pain, Disp: , Rfl:  .  UNABLE TO FIND, Nature's Thryoid- take 3 g M, W and F, and 2 g every other day, Disp: , Rfl:  .  UNABLE TO FIND, Progesterone SR 300 mg (compound), take 1 tablet daily at bedtime, Disp: , Rfl:  .  UNABLE TO FIND, Seroquel (Compound)- 8.3 mg per mL, take 1 mL daily at bedtime, Disp: , Rfl:  .  UNABLE TO FIND, Pregnenolone 90 mg (compound), take 1 tablet daily, Disp: , Rfl:  .  UNABLE TO FIND, Testosterone Cream 0.5%- 1g/33mL, Disp: , Rfl:  .  oseltamivir (TAMIFLU) 75 MG capsule, Take 1 capsule (75 mg total) by mouth 2 (two) times daily. (Patient not taking: Reported on 07/28/2018), Disp: 10 capsule, Rfl: 0 Medication Side Effects: none  Family Medical/ Social History: Changes? No.  She is still staying with her mother and caring for her a couple of days a week.  MENTAL HEALTH EXAM:  There were no vitals taken for this visit.There is no height or  weight on file to calculate BMI.  General Appearance: unable to assess  Eye Contact:  unable to assess  Speech:  Clear and Coherent  Volume:  Normal  Mood:  Euthymic  Affect:  unable to assess  Thought Process:  Goal Directed  Orientation:  Full (Time, Place, and Person)  Thought Content: Logical   Suicidal Thoughts:  No  Homicidal Thoughts:  No  Memory:  WNL  Judgement:  Good  Insight:  Good  Psychomotor Activity:  unable to assess  Concentration:  Concentration: Good and Attention Span: Good  Recall:  Good  Fund of Knowledge: Good  Language: Good  Assets:  Desire for Improvement  ADL's:  Intact  Cognition: WNL  Prognosis:  Good    DIAGNOSES:    ICD-10-CM   1. Bipolar I disorder ([place]) F31.9   2. Insomnia, unspecified type G47.00   3. PTSD (post-traumatic stress disorder) F43.10   4. Attention deficit hyperactivity disorder (ADHD), predominantly inattentive type F90.0     Receiving Psychotherapy: No    RECOMMENDATIONS:  Continue Adderall 20 mg, one fourth p.o. 4 times daily as needed. Continue Lamictal 200 mg 1.5 pills daily. Continue lithium 1200 mg daily. Continue Seroquel 8.3 mg/cc, written for twice daily but usually takes daily. Labs were ordered in the past and reminded her to  get them done as soon as possible. Return in 3 months.  Donnal Moat, PA-C   This record has been created using [employer].  Chart creation errors have been sought, but may not always have been located and corrected. Such creation errors do not reflect on the standard of medical care.

## 2018-07-29 ENCOUNTER — Other Ambulatory Visit: Payer: Self-pay | Admitting: Physician Assistant

## 2018-07-29 ENCOUNTER — Telehealth: Payer: Self-pay | Admitting: Physician Assistant

## 2018-07-29 NOTE — Telephone Encounter (Signed)
Rx called into pharmacy. They will call her when it is ready.

## 2018-08-11 DIAGNOSIS — F1120 Opioid dependence, uncomplicated: Secondary | ICD-10-CM | POA: Diagnosis not present

## 2018-08-11 DIAGNOSIS — F319 Bipolar disorder, unspecified: Secondary | ICD-10-CM | POA: Diagnosis not present

## 2018-08-11 DIAGNOSIS — Z79899 Other long term (current) drug therapy: Secondary | ICD-10-CM | POA: Diagnosis not present

## 2018-08-11 DIAGNOSIS — G894 Chronic pain syndrome: Secondary | ICD-10-CM | POA: Diagnosis not present

## 2018-08-11 DIAGNOSIS — M2669 Other specified disorders of temporomandibular joint: Secondary | ICD-10-CM | POA: Diagnosis not present

## 2018-08-25 ENCOUNTER — Other Ambulatory Visit: Payer: Self-pay | Admitting: Physician Assistant

## 2018-08-26 NOTE — Telephone Encounter (Signed)
Seems to be different directions as well compared to written 5 mg qid?

## 2018-09-05 DIAGNOSIS — Z79899 Other long term (current) drug therapy: Secondary | ICD-10-CM | POA: Diagnosis not present

## 2018-09-05 DIAGNOSIS — M1990 Unspecified osteoarthritis, unspecified site: Secondary | ICD-10-CM | POA: Diagnosis not present

## 2018-09-05 DIAGNOSIS — R5381 Other malaise: Secondary | ICD-10-CM | POA: Diagnosis not present

## 2018-09-05 DIAGNOSIS — M609 Myositis, unspecified: Secondary | ICD-10-CM | POA: Diagnosis not present

## 2018-09-05 DIAGNOSIS — E063 Autoimmune thyroiditis: Secondary | ICD-10-CM | POA: Diagnosis not present

## 2018-09-05 DIAGNOSIS — K5792 Diverticulitis of intestine, part unspecified, without perforation or abscess without bleeding: Secondary | ICD-10-CM | POA: Diagnosis not present

## 2018-09-05 DIAGNOSIS — R7309 Other abnormal glucose: Secondary | ICD-10-CM | POA: Diagnosis not present

## 2018-09-05 DIAGNOSIS — E782 Mixed hyperlipidemia: Secondary | ICD-10-CM | POA: Diagnosis not present

## 2018-09-05 DIAGNOSIS — N951 Menopausal and female climacteric states: Secondary | ICD-10-CM | POA: Diagnosis not present

## 2018-09-10 DIAGNOSIS — G894 Chronic pain syndrome: Secondary | ICD-10-CM | POA: Diagnosis not present

## 2018-09-10 DIAGNOSIS — F319 Bipolar disorder, unspecified: Secondary | ICD-10-CM | POA: Diagnosis not present

## 2018-09-10 DIAGNOSIS — F1120 Opioid dependence, uncomplicated: Secondary | ICD-10-CM | POA: Diagnosis not present

## 2018-09-10 DIAGNOSIS — M2669 Other specified disorders of temporomandibular joint: Secondary | ICD-10-CM | POA: Diagnosis not present

## 2018-09-11 ENCOUNTER — Other Ambulatory Visit: Payer: Self-pay

## 2018-09-11 ENCOUNTER — Encounter: Payer: Self-pay | Admitting: Family Medicine

## 2018-09-11 ENCOUNTER — Ambulatory Visit (INDEPENDENT_AMBULATORY_CARE_PROVIDER_SITE_OTHER): Payer: PPO | Admitting: Family Medicine

## 2018-09-11 VITALS — BP 160/88 | HR 76 | Temp 99.1°F | Ht 68.0 in | Wt 218.0 lb

## 2018-09-11 DIAGNOSIS — R030 Elevated blood-pressure reading, without diagnosis of hypertension: Secondary | ICD-10-CM | POA: Diagnosis not present

## 2018-09-11 DIAGNOSIS — R609 Edema, unspecified: Secondary | ICD-10-CM | POA: Diagnosis not present

## 2018-09-11 MED ORDER — HYDROCHLOROTHIAZIDE 25 MG PO TABS: 25.0000 mg | ORAL_TABLET | Freq: Every day | ORAL | 0 refills | Status: DC

## 2018-09-11 NOTE — Progress Notes (Signed)
BP (!) 160/88   Pulse 76   Temp 99.1 F (37.3 C) (Oral)   Ht 5\' 8"  (1.727 m)   Wt 218 lb (98.9 kg)   BMI 33.15 kg/m    Subjective:    Patient ID: Katherine James, female    DOB: 11-Nov-1960, 58 y.o.   MRN: [id number]  HPI: Katherine James is a 58 y.o. female  Chief Complaint  Patient presents with  . Foot Swelling    bilateral ankle x about 2 weeks. gets worse during night  . Knee Pain    right    Has been having swelling in her legs for about 2.5-3 weeks. She notes that her legs have stayed bigger than normal for about 30 years. She notes that they are improving at night, but not significantly. Both are swelling. Ankles are reddish for a while, but not hot or red today. Has gained some weight. Has not been eating extra salt. She notes that she was taking an old Rx for lyrica that she was taking for about 2 weeks up until about a week ago. She denies any SOB. No redness or swelling.  ELEVATED BLOOD PRESSURE Duration of elevated BP: since last year- has been under a lot of stress with caring for her mother BP monitoring frequency: rarely BP range: 140s/80s at the lowest Previous BP meds: no Recent stressors: yes Family history of hypertension: yes Recurrent headaches: yes Visual changes: yes- with adderall Palpitations: no  Dyspnea: yes Chest pain: no Lower extremity edema: yes Dizzy/lightheaded: yes- with adderall Transient ischemic attacks: no   Relevant past medical, surgical, family and social history reviewed and updated as indicated. Interim medical history since our last visit reviewed. Allergies and medications reviewed and updated.  Review of Systems  Constitutional: Negative.   HENT: Negative.   Respiratory: Negative.   Cardiovascular: Positive for leg swelling. Negative for chest pain and palpitations.  Gastrointestinal: Negative.   Psychiatric/Behavioral: Negative.     Per HPI unless specifically indicated above     Objective:    BP (!) 160/88    Pulse 76   Temp 99.1 F (37.3 C) (Oral)   Ht 5\' 8"  (1.727 m)   Wt 218 lb (98.9 kg)   BMI 33.15 kg/m   Wt Readings from Last 3 Encounters:  09/11/18 218 lb (98.9 kg)  05/09/18 200 lb (90.7 kg)  07/10/17 205 lb (93 kg)    Physical Exam Vitals signs and nursing note reviewed.  Constitutional:      General: She is not in acute distress.    Appearance: Normal appearance. She is not ill-appearing, toxic-appearing or diaphoretic.  HENT:     Head: Normocephalic and atraumatic.     Right Ear: External ear normal.     Left Ear: External ear normal.     Nose: Nose normal.     Mouth/Throat:     Mouth: Mucous membranes are moist.     Pharynx: Oropharynx is clear.  Eyes:     General: No scleral icterus.       Right eye: No discharge.        Left eye: No discharge.     Conjunctiva/sclera: Conjunctivae normal.     Pupils: Pupils are equal, round, and reactive to light.  Neck:     Musculoskeletal: Normal range of motion.  Pulmonary:     Effort: Pulmonary effort is normal. No respiratory distress.     Comments: Speaking in full sentences Musculoskeletal: Normal range of motion.  Right lower leg: Edema (2+ edema) present.     Left lower leg: Edema (2+ edema) present.  Skin:    Coloration: Skin is not jaundiced or pale.     Findings: No bruising, erythema, lesion or rash.  Neurological:     Mental Status: She is alert and oriented to person, place, and time. Mental status is at baseline.  Psychiatric:        Mood and Affect: Mood normal.        Behavior: Behavior normal.        Thought Content: Thought content normal.        Judgment: Judgment normal.     Results for orders placed or performed in visit on 05/09/18  Veritor Flu A/B Waived  Result Value Ref Range   Influenza A Negative Negative   Influenza B Negative Negative      Assessment & Plan:   Problem List Items Addressed This Visit    None    Visit Diagnoses    Peripheral edema    -  Primary   Of unclear  etiology. Will get her in for face-to-face and labs ASAP. Start with elevation and compression and HCTZ until she can come in for eval.    Elevated blood-pressure reading, without diagnosis of hypertension       States BP has been urnning high for a year. Needs labs and in-person evaluation. Will start her on HCTZ for a couple of days until she can get her in for eval.        Follow up plan: Return ASAP.    Marland Kitchen This visit was completed via Doximity due to the restrictions of the COVID-19 pandemic. All issues as above were discussed and addressed. Physical exam was done as above through visual confirmation on Doximity. If it was felt that the patient should be evaluated in the office, they were directed there. The patient verbally consented to this visit. . Location of the patient: home . Location of the provider: work . Those involved with this call:  . Provider: Park Liter, DO . CMA: Lesle Chris, [place] . Front Desk/Registration: Don Perking  . Time spent on call: 15 minutes with patient face to face via video conference. More than 50% of this time was spent in counseling and coordination of care. 23 minutes total spent in review of patient's record and preparation of their chart.

## 2018-09-12 ENCOUNTER — Encounter: Payer: Self-pay | Admitting: Family Medicine

## 2018-09-17 ENCOUNTER — Ambulatory Visit (INDEPENDENT_AMBULATORY_CARE_PROVIDER_SITE_OTHER): Payer: PPO | Admitting: Family Medicine

## 2018-09-17 ENCOUNTER — Encounter: Payer: Self-pay | Admitting: Family Medicine

## 2018-09-17 ENCOUNTER — Other Ambulatory Visit: Payer: Self-pay

## 2018-09-17 NOTE — Progress Notes (Deleted)
   BP 125/83   Pulse 70   Temp 99.2 F (37.3 C) (Oral)   Ht 5\' 8"  (1.727 m)   SpO2 100%   BMI 33.15 kg/m    Subjective:    Patient ID: Katherine James, female    DOB: 12-14-1960, 58 y.o.   MRN: [id number]  HPI: Katherine James is a 58 y.o. female  Chief Complaint  Patient presents with  . Joint Swelling    bilateral ankles long time ago but got worse about 2 weeks ago. states swelling goes up to her knees     Relevant past medical, surgical, family and social history reviewed and updated as indicated. Interim medical history since our last visit reviewed. Allergies and medications reviewed and updated.  Review of Systems  Per HPI unless specifically indicated above     Objective:    BP 125/83   Pulse 70   Temp 99.2 F (37.3 C) (Oral)   Ht 5\' 8"  (1.727 m)   SpO2 100%   BMI 33.15 kg/m   Wt Readings from Last 3 Encounters:  09/11/18 218 lb (98.9 kg)  05/09/18 200 lb (90.7 kg)  07/10/17 205 lb (93 kg)    Physical Exam  Results for orders placed or performed in visit on 05/09/18  Veritor Flu A/B Waived  Result Value Ref Range   Influenza A Negative Negative   Influenza B Negative Negative      Assessment & Plan:   Problem List Items Addressed This Visit    None       Follow up plan: No follow-ups on file.

## 2018-09-22 NOTE — Progress Notes (Signed)
Patient left without being seen today after being checked in

## 2018-09-24 ENCOUNTER — Other Ambulatory Visit: Payer: Self-pay | Admitting: Physician Assistant

## 2018-09-24 ENCOUNTER — Telehealth: Payer: Self-pay | Admitting: Family Medicine

## 2018-09-24 NOTE — Telephone Encounter (Signed)
Pt called back in to do script screening. She mentioned that she has fever, body aches, a stuffy nose. She states that provider wanted her to come in for tomorrow's appt. Please advise if this should be virtual, in person, or rescheduled.

## 2018-09-24 NOTE — Telephone Encounter (Signed)
Has appt 09/15 with TH Last fill 06/30

## 2018-09-25 ENCOUNTER — Other Ambulatory Visit: Payer: Self-pay

## 2018-09-25 ENCOUNTER — Ambulatory Visit (INDEPENDENT_AMBULATORY_CARE_PROVIDER_SITE_OTHER): Payer: PPO | Admitting: Family Medicine

## 2018-09-25 ENCOUNTER — Encounter: Payer: Self-pay | Admitting: Family Medicine

## 2018-09-25 VITALS — BP 161/88

## 2018-09-25 DIAGNOSIS — R609 Edema, unspecified: Secondary | ICD-10-CM

## 2018-09-25 DIAGNOSIS — M797 Fibromyalgia: Secondary | ICD-10-CM

## 2018-09-25 DIAGNOSIS — R600 Localized edema: Secondary | ICD-10-CM

## 2018-09-25 DIAGNOSIS — R030 Elevated blood-pressure reading, without diagnosis of hypertension: Secondary | ICD-10-CM | POA: Diagnosis not present

## 2018-09-25 DIAGNOSIS — J069 Acute upper respiratory infection, unspecified: Secondary | ICD-10-CM | POA: Diagnosis not present

## 2018-09-25 MED ORDER — HYDROCHLOROTHIAZIDE 25 MG PO TABS: 25.0000 mg | ORAL_TABLET | Freq: Every day | ORAL | 1 refills | Status: DC

## 2018-09-25 NOTE — Telephone Encounter (Signed)
Called pt to set up virtual or reschedule, no answer, left vm

## 2018-09-25 NOTE — Progress Notes (Signed)
BP (!) 161/88    Subjective:    Patient ID: Katherine James, female    DOB: 02/28/1960, 58 y.o.   MRN: [id number]  HPI: Katherine James is a 58 y.o. female  Chief Complaint  Patient presents with   Edema    ankles and feet, states that they are better since starting the water pill    UPPER RESPIRATORY TRACT INFECTION Duration: 2 months- feels like it's a fibromyaglia flare, has been having blurred vision, headache, pain in her neck on the L side of her neck Fever: low grade  Cough: yes Shortness of breath: yes Wheezing: no Chest pain: no Chest tightness: yes Chest congestion: yes Nasal congestion: yes Runny nose: no Post nasal drip: yes Sneezing: no Sore throat: no Swollen glands: yes Sinus pressure: no Headache: yes Face pain: no Toothache: no Ear pain: no  Ear pressure: no  Eyes red/itching: yes Eye drainage/crusting: no  Vomiting: no Rash: no Fatigue: yes Sick contacts: no Strep contacts: no  Context: better Recurrent sinusitis: no Relief with OTC cold/cough medications: no  Treatments attempted: none   Does not want to wear a mask in [employer].   Before the water pill- her feet and ankles were more swollen than she has ever been.   Has been noticing that the veins on top of her hands will buldge. She notes that she has not been as good about drinking water.   The last few months she has been more tired than usual.   She has been taking adderall and is concerned that it might be causing her blood pressure issues.   Wants to do horse therapy.   Relevant past medical, surgical, family and social history reviewed and updated as indicated. Interim medical history since our last visit reviewed. Allergies and medications reviewed and updated.  Review of Systems  Constitutional: Positive for chills, fatigue and fever. Negative for activity change, appetite change, diaphoresis and unexpected weight change.  HENT: Positive for sinus pain. Negative for  congestion, dental problem, drooling, ear discharge, ear pain, facial swelling, hearing loss, mouth sores, nosebleeds, postnasal drip, rhinorrhea, sinus pressure, sneezing, sore throat, tinnitus, trouble swallowing and voice change.   Respiratory: Negative.   Cardiovascular: Positive for leg swelling. Negative for chest pain and palpitations.  Gastrointestinal: Negative.   Neurological: Negative.   Psychiatric/Behavioral: Negative.     Per HPI unless specifically indicated above     Objective:    BP (!) 161/88   Wt Readings from Last 3 Encounters:  09/11/18 218 lb (98.9 kg)  05/09/18 200 lb (90.7 kg)  07/10/17 205 lb (93 kg)    Physical Exam Vitals signs and nursing note reviewed.  Constitutional:      General: She is not in acute distress.    Appearance: Normal appearance. She is not ill-appearing, toxic-appearing or diaphoretic.  HENT:     Head: Normocephalic and atraumatic.     Right Ear: External ear normal.     Left Ear: External ear normal.     Nose: Nose normal.     Mouth/Throat:     Mouth: Mucous membranes are moist.     Pharynx: Oropharynx is clear.  Eyes:     General: No scleral icterus.       Right eye: No discharge.        Left eye: No discharge.     Conjunctiva/sclera: Conjunctivae normal.     Pupils: Pupils are equal, round, and reactive to light.  Neck:     Musculoskeletal:  Normal range of motion.  Pulmonary:     Effort: Pulmonary effort is normal. No respiratory distress.     Comments: Speaking in full sentences Musculoskeletal: Normal range of motion.  Skin:    Coloration: Skin is not jaundiced or pale.     Findings: No bruising, erythema, lesion or rash.  Neurological:     Mental Status: She is alert and oriented to person, place, and time. Mental status is at baseline.  Psychiatric:        Mood and Affect: Mood normal.        Behavior: Behavior normal.        Thought Content: Thought content normal.        Judgment: Judgment normal.      Results for orders placed or performed in visit on 05/09/18  Veritor Flu A/B Waived  Result Value Ref Range   Influenza A Negative Negative   Influenza B Negative Negative      Assessment & Plan:   Problem List Items Addressed This Visit      Other   Fibromyalgia    Advised to drop off form for horse-therapy, and we will fill it out.        Other Visit Diagnoses    Viral upper respiratory tract infection    -  Primary   Will arrange for COVID testing- advised self-quarantine until results are back. Continue to monitor.    Relevant Medications   Tavaborole (KERYDIN) 5 % SOLN   Other Relevant Orders   Novel Coronavirus, NAA ([facility])   Peripheral edema       Remains of unclear etiology, likely venous stasis, but needs labs and in person evaluation- will set this up after COVID testing.    Elevated blood-pressure reading, without diagnosis of hypertension       Remains elevated. Likely due in part to adderall- tolerating HCTZ well. Recheck in person. May need additional medication.       Follow up plan: Return PENDING [place].    This visit was completed via FaceTime due to the restrictions of the COVID-19 pandemic. All issues as above were discussed and addressed. Physical exam was done as above through visual confirmation on FaceTime. If it was felt that the patient should be evaluated in the office, they were directed there. The patient verbally consented to this visit.  Location of the patient: home  Location of the provider: work  Those involved with this call:   Provider: Park Liter, DO  CMA: Gerrit Halls, CMA  Front Desk/Registration: Jill Side   Time spent on call: 25 minutes with patient face to face via video conference. More than 50% of this time was spent in counseling and coordination of care. 40 minutes total spent in review of patient's record and preparation of their chart.

## 2018-09-25 NOTE — Telephone Encounter (Signed)
She will need a virtual or to be rescheduled. Not to come into the office.

## 2018-09-25 NOTE — Patient Instructions (Addendum)
We are putting in the order for you to have your COVID testing done. This can be done at the [facility] can go for testing from [place] to 3:30PM Monday-Friday. The results are taking 7-10 days to come back. We will call you with your test results when they come back. We are scheduling you the next available virtual appointment with a provider at [facility] to discuss your symptoms and see if there is another reason you're sick. While you are waiting for your results and your appointment, you need to self-quarantine. Self-quarantine means that you do not leave your house. You do not go to the store or drive around. You try to avoid the other people who live with you (stay in a different room and wear a mask when you have to be in contact with them.) Your family members and house-mates need to also be aware that you are being tested in case they develop any symptoms. If you become significantly worse and cannot breathe while waiting for results, call 911. If you have any questions or are concerned while you're waiting for your results or your appointment- please call our office or send a mychart message.

## 2018-09-26 ENCOUNTER — Ambulatory Visit: Payer: Self-pay | Admitting: Family Medicine

## 2018-09-26 ENCOUNTER — Telehealth: Payer: Self-pay | Admitting: Family Medicine

## 2018-09-26 NOTE — Telephone Encounter (Signed)
Pt wants to know if she should have the CNA come in at 7 pm tonight? Would like your opinion asap.

## 2018-09-26 NOTE — Telephone Encounter (Signed)
Ended up not needing this chart.

## 2018-09-26 NOTE — Telephone Encounter (Signed)
No- she was advised to quarantine. She needs to stay away from other people. If she is at her mother's house, she should not have the CNA come until she gets the results on her COVID test

## 2018-09-26 NOTE — Telephone Encounter (Signed)
Noted  

## 2018-09-26 NOTE — Telephone Encounter (Signed)
Pt called and stated that she is having symptoms and would like to know if she should have her mother CNA come to the house. Pt would like some advise

## 2018-09-26 NOTE — Telephone Encounter (Signed)
Called and let patient know what Dr. Wynetta Emery said. Patient states she is not going to [facility] for the test and that she will go to [facility]. She just wanted to let Dr. Wynetta Emery know.

## 2018-09-26 NOTE — Telephone Encounter (Signed)
Routing to Dr. Wynetta Emery who is her PCP  Copied from [place] [phone]. 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

## 2018-09-27 NOTE — Assessment & Plan Note (Signed)
Advised to drop off form for horse-therapy, and we will fill it out.

## 2018-09-28 DIAGNOSIS — E039 Hypothyroidism, unspecified: Secondary | ICD-10-CM | POA: Diagnosis not present

## 2018-09-28 DIAGNOSIS — F319 Bipolar disorder, unspecified: Secondary | ICD-10-CM | POA: Diagnosis not present

## 2018-09-28 DIAGNOSIS — Z7982 Long term (current) use of aspirin: Secondary | ICD-10-CM | POA: Diagnosis not present

## 2018-09-28 DIAGNOSIS — Z79899 Other long term (current) drug therapy: Secondary | ICD-10-CM | POA: Diagnosis not present

## 2018-09-28 DIAGNOSIS — M1990 Unspecified osteoarthritis, unspecified site: Secondary | ICD-10-CM | POA: Diagnosis not present

## 2018-09-28 DIAGNOSIS — M797 Fibromyalgia: Secondary | ICD-10-CM | POA: Diagnosis not present

## 2018-09-28 DIAGNOSIS — Z20828 Contact with and (suspected) exposure to other viral communicable diseases: Secondary | ICD-10-CM | POA: Diagnosis not present

## 2018-09-28 DIAGNOSIS — R0989 Other specified symptoms and signs involving the circulatory and respiratory systems: Secondary | ICD-10-CM | POA: Diagnosis not present

## 2018-10-02 ENCOUNTER — Other Ambulatory Visit: Payer: Self-pay

## 2018-10-02 ENCOUNTER — Ambulatory Visit (INDEPENDENT_AMBULATORY_CARE_PROVIDER_SITE_OTHER): Payer: PPO | Admitting: Family Medicine

## 2018-10-02 ENCOUNTER — Encounter: Payer: Self-pay | Admitting: Family Medicine

## 2018-10-02 VITALS — BP 130/81 | HR 71 | Temp 99.5°F | Ht 68.0 in | Wt 218.0 lb

## 2018-10-02 DIAGNOSIS — E039 Hypothyroidism, unspecified: Secondary | ICD-10-CM

## 2018-10-02 DIAGNOSIS — R609 Edema, unspecified: Secondary | ICD-10-CM

## 2018-10-02 DIAGNOSIS — R030 Elevated blood-pressure reading, without diagnosis of hypertension: Secondary | ICD-10-CM

## 2018-10-02 DIAGNOSIS — G629 Polyneuropathy, unspecified: Secondary | ICD-10-CM

## 2018-10-02 DIAGNOSIS — Z1159 Encounter for screening for other viral diseases: Secondary | ICD-10-CM | POA: Diagnosis not present

## 2018-10-02 DIAGNOSIS — R5382 Chronic fatigue, unspecified: Secondary | ICD-10-CM

## 2018-10-02 DIAGNOSIS — Z13220 Encounter for screening for lipoid disorders: Secondary | ICD-10-CM | POA: Diagnosis not present

## 2018-10-02 LAB — MICROALBUMIN, URINE WAIVED
Creatinine, Urine Waived: 10 mg/dL (ref 10–300)
Microalb, Ur Waived: 10 mg/L (ref 0–19)

## 2018-10-02 LAB — UA/M W/RFLX CULTURE, ROUTINE
Bilirubin, UA: NEGATIVE
Glucose, UA: NEGATIVE
Ketones, UA: NEGATIVE
Leukocytes,UA: NEGATIVE
Nitrite, UA: NEGATIVE
Protein,UA: NEGATIVE
RBC, UA: NEGATIVE
Specific Gravity, UA: 1.015 (ref 1.005–1.030)
Urobilinogen, Ur: 0.2 mg/dL (ref 0.2–1.0)
pH, UA: 7 (ref 5.0–7.5)

## 2018-10-02 LAB — BAYER DCA HB A1C WAIVED: HB A1C (BAYER DCA - WAIVED): 4.9 % (ref <7.0)

## 2018-10-02 NOTE — Progress Notes (Signed)
BP 130/81   Pulse 71   Temp 99.5 F (37.5 C) (Oral)   Ht 5\' 8"  (1.727 m)   Wt 218 lb (98.9 kg)   SpO2 99%   BMI 33.15 kg/m    Subjective:    Patient ID: Katherine James, female    DOB: [birth year], 58 y.o.   MRN: [id number]  HPI: Katherine James is a 58 y.o. female  Chief Complaint  Patient presents with  . Joint Swelling    bilateral feet. x about a month. got worse a week ago   Feels like her legs are swelling again. Has been bigger- has been wearing compression sock. Has been taking the HCTZ with some benefit.  HYPERTENSION Hypertension status: better  Satisfied with current treatment? no Duration of hypertension: unclear BP monitoring frequency:  not checking BP medication side effects:  no Medication compliance: good compliance Previous BP meds:HCTZ Aspirin: no Recurrent headaches: no Visual changes: no Palpitations: no Dyspnea: no Chest pain: no Lower extremity edema: no Dizzy/lightheaded: no  Relevant past medical, surgical, family and social history reviewed and updated as indicated. Interim medical history since our last visit reviewed. Allergies and medications reviewed and updated.  Review of Systems  Constitutional: Negative.   HENT: Negative.   Cardiovascular: Positive for leg swelling. Negative for chest pain and palpitations.  Gastrointestinal: Negative.   Neurological: Negative.   Psychiatric/Behavioral: Negative.     Per HPI unless specifically indicated above     Objective:    BP 130/81   Pulse 71   Temp 99.5 F (37.5 C) (Oral)   Ht 5\' 8"  (1.727 m)   Wt 218 lb (98.9 kg)   SpO2 99%   BMI 33.15 kg/m   Wt Readings from Last 3 Encounters:  10/02/18 218 lb (98.9 kg)  09/11/18 218 lb (98.9 kg)  05/09/18 200 lb (90.7 kg)    Physical Exam Vitals signs and nursing note reviewed.  Constitutional:      General: She is not in acute distress.    Appearance: Normal appearance. She is not ill-appearing, toxic-appearing or diaphoretic.   HENT:     Head: Normocephalic and atraumatic.     Right Ear: External ear normal.     Left Ear: External ear normal.     Nose: Nose normal.     Mouth/Throat:     Mouth: Mucous membranes are moist.     Pharynx: Oropharynx is clear.  Eyes:     General: No scleral icterus.       Right eye: No discharge.        Left eye: No discharge.     Extraocular Movements: Extraocular movements intact.     Conjunctiva/sclera: Conjunctivae normal.     Pupils: Pupils are equal, round, and reactive to light.  Neck:     Musculoskeletal: Normal range of motion and neck supple.  Cardiovascular:     Rate and Rhythm: Normal rate and regular rhythm.     Pulses: Normal pulses.     Heart sounds: Normal heart sounds. No murmur. No friction rub. No gallop.   Pulmonary:     Effort: Pulmonary effort is normal. No respiratory distress.     Breath sounds: Normal breath sounds. No stridor. No wheezing, rhonchi or rales.  Chest:     Chest wall: No tenderness.  Musculoskeletal: Normal range of motion.     Right lower leg: Edema (trace ) present.     Left lower leg: Edema (trace) present.  Skin:    General:  Skin is warm and dry.     Capillary Refill: Capillary refill takes less than 2 seconds.     Coloration: Skin is not jaundiced or pale.     Findings: No bruising, erythema, lesion or rash.  Neurological:     General: No focal deficit present.     Mental Status: She is alert and oriented to person, place, and time. Mental status is at baseline.  Psychiatric:        Mood and Affect: Mood normal.        Behavior: Behavior normal.        Thought Content: Thought content normal.        Judgment: Judgment normal.     Results for orders placed or performed in visit on 10/02/18  Comprehensive metabolic panel  Result Value Ref Range   Glucose 111 (H) 65 - 99 mg/dL   BUN 21 6 - 24 mg/dL   Creatinine, Ser 0.76 0.57 - 1.00 mg/dL   GFR calc non Af Amer 87 >59 mL/min/1.73   GFR calc Af Amer 101 >59 mL/min/1.73    BUN/Creatinine Ratio 28 (H) 9 - 23   Sodium 141 134 - 144 mmol/L   Potassium 4.2 3.5 - 5.2 mmol/L   Chloride 102 96 - 106 mmol/L   CO2 24 20 - 29 mmol/L   Calcium 9.9 8.7 - 10.2 mg/dL   Total Protein 6.6 6.0 - 8.5 g/dL   Albumin 4.8 3.8 - 4.9 g/dL   Globulin, Total 1.8 1.5 - 4.5 g/dL   Albumin/Globulin Ratio 2.7 (H) 1.2 - 2.2   Bilirubin Total <0.2 0.0 - 1.2 mg/dL   Alkaline Phosphatase 110 39 - 117 IU/L   AST 17 0 - 40 IU/L   ALT 17 0 - 32 IU/L  CBC with Differential/Platelet  Result Value Ref Range   WBC 9.5 3.4 - 10.8 x10E3/uL   RBC 3.80 3.77 - 5.28 x10E6/uL   Hemoglobin 11.9 11.1 - 15.9 g/dL   Hematocrit 35.4 34.0 - 46.6 %   MCV 93 79 - 97 fL   MCH 31.3 26.6 - 33.0 pg   MCHC 33.6 31.5 - 35.7 g/dL   RDW 11.7 11.7 - 15.4 %   Platelets 375 150 - 450 x10E3/uL   Neutrophils 73 Not Estab. %   Lymphs 17 Not Estab. %   Monocytes 5 Not Estab. %   Eos 4 Not Estab. %   Basos 1 Not Estab. %   Neutrophils Absolute 6.9 1.4 - 7.0 x10E3/uL   Lymphocytes Absolute 1.6 0.7 - 3.1 x10E3/uL   Monocytes Absolute 0.5 0.1 - 0.9 x10E3/uL   EOS (ABSOLUTE) 0.3 0.0 - 0.4 x10E3/uL   Basophils Absolute 0.1 0.0 - 0.2 x10E3/uL   Immature Granulocytes 0 Not Estab. %   Immature Grans (Abs) 0.0 0.0 - 0.1 x10E3/uL  Bayer DCA Hb A1c Waived  Result Value Ref Range   HB A1C (BAYER DCA - WAIVED) 4.9 <7.0 %  HIV Antibody (routine testing w rflx)  Result Value Ref Range   HIV Screen 4th Generation wRfx Non Reactive Non Reactive  Lipid Panel w/o Chol/HDL Ratio  Result Value Ref Range   Cholesterol, Total 193 100 - 199 mg/dL   Triglycerides 270 (H) 0 - 149 mg/dL   HDL 58 >39 mg/dL   VLDL Cholesterol Cal 54 (H) 5 - 40 mg/dL   LDL Calculated 81 0 - 99 mg/dL  Microalbumin, Urine Waived  Result Value Ref Range   Microalb, Ur Waived 10 0 -  19 mg/L   Creatinine, Urine Waived 10 10 - 300 mg/dL   Microalb/Creat Ratio 30-300 (H) <30 mg/g  TSH  Result Value Ref Range   TSH 4.930 (H) 0.450 - 4.500 uIU/mL   UA/M w/rflx Culture, Routine   Specimen: Urine   URINE  Result Value Ref Range   Specific Gravity, UA 1.015 1.005 - 1.030   pH, UA 7.0 5.0 - 7.5   Color, UA Yellow Yellow   Appearance Ur Clear Clear   Leukocytes,UA Negative Negative   Protein,UA Negative Negative/Trace   Glucose, UA Negative Negative   Ketones, UA Negative Negative   RBC, UA Negative Negative   Bilirubin, UA Negative Negative   Urobilinogen, Ur 0.2 0.2 - 1.0 mg/dL   Nitrite, UA Negative Negative  Uric acid  Result Value Ref Range   Uric Acid 7.8 (H) 2.5 - 7.1 mg/dL  VITAMIN D 25 Hydroxy (Vit-D Deficiency, Fractures)  Result Value Ref Range   Vit D, 25-Hydroxy 25.5 (L) 30.0 - 100.0 ng/mL  Hepatitis C Antibody  Result Value Ref Range   Hep C Virus Ab <0.1 0.0 - 0.9 s/co ratio  B Nat Peptide  Result Value Ref Range   BNP 18.5 0.0 - 100.0 pg/mL  Lyme Ab/Western Blot Reflex  Result Value Ref Range   Lyme IgG/IgM Ab <0.91 0.00 - 0.90 ISR   LYME DISEASE AB, QUANT, IGM <0.80 0.00 - 1.61 index  Ehrlichia Antibody Panel  Result Value Ref Range   E.Chaffeensis (HME) IgG Negative Neg:<1:64   E. Chaffeensis (HME) IgM Titer Negative Neg:<1:20   HGE IgG Titer Negative Neg:<1:64   HGE IgM Titer Negative Neg:<1:20  Babesia microti Antibody Panel  Result Value Ref Range   Babesia microti IgM <1:10 Neg:<1:10   Babesia microti IgG <1:10 Neg:<1:10  Rocky mtn spotted fvr abs pnl(IgG+IgM)  Result Value Ref Range   RMSF IgG Negative Negative   RMSF IgM 0.22 0.00 - 0.89 index  Specimen status report  Result Value Ref Range   specimen status report Comment       Assessment & Plan:   Problem List Items Addressed This Visit      Endocrine   Hypothyroid    Rechecking labs today. Await results. Treat as needed.       Relevant Orders   Comprehensive metabolic panel (Completed)   CBC with Differential/Platelet (Completed)   Bayer DCA Hb A1c Waived (Completed)   HIV Antibody (routine testing w rflx) (Completed)   TSH  (Completed)   UA/M w/rflx Culture, Routine (Completed)   Uric acid (Completed)   VITAMIN D 25 Hydroxy (Vit-D Deficiency, Fractures) (Completed)   Lyme Ab/Western Blot Reflex (Completed)   Ehrlichia Antibody Panel (Completed)   Babesia microti Antibody Panel (Completed)   Rocky mtn spotted fvr abs pnl(IgG+IgM) (Completed)     Nervous and Auditory   Neuropathy    Rechecking labs today. Await results. Treat as needed.       Relevant Orders   Comprehensive metabolic panel (Completed)   CBC with Differential/Platelet (Completed)   Bayer DCA Hb A1c Waived (Completed)   HIV Antibody (routine testing w rflx) (Completed)   TSH (Completed)   UA/M w/rflx Culture, Routine (Completed)   Uric acid (Completed)   VITAMIN D 25 Hydroxy (Vit-D Deficiency, Fractures) (Completed)   Lyme Ab/Western Blot Reflex (Completed)   Ehrlichia Antibody Panel (Completed)   Babesia microti Antibody Panel (Completed)   Rocky mtn spotted fvr abs pnl(IgG+IgM) (Completed)     Other   Chronic  fatigue    Rechecking labs today. Await results. Treat as needed.       Relevant Orders   Comprehensive metabolic panel (Completed)   CBC with Differential/Platelet (Completed)   Bayer DCA Hb A1c Waived (Completed)   HIV Antibody (routine testing w rflx) (Completed)   TSH (Completed)   UA/M w/rflx Culture, Routine (Completed)   Uric acid (Completed)   VITAMIN D 25 Hydroxy (Vit-D Deficiency, Fractures) (Completed)   Lyme Ab/Western Blot Reflex (Completed)   Ehrlichia Antibody Panel (Completed)   Babesia microti Antibody Panel (Completed)   Rocky mtn spotted fvr abs pnl(IgG+IgM) (Completed)    Other Visit Diagnoses    Peripheral edema    -  Primary   Checking labs today. Await results. If negative, will refer to vascular for further evaluation. Call with any concerns. Continue to monitor.    Relevant Orders   Comprehensive metabolic panel (Completed)   CBC with Differential/Platelet (Completed)   Bayer DCA Hb A1c  Waived (Completed)   HIV Antibody (routine testing w rflx) (Completed)   TSH (Completed)   UA/M w/rflx Culture, Routine (Completed)   Uric acid (Completed)   VITAMIN D 25 Hydroxy (Vit-D Deficiency, Fractures) (Completed)   B Nat Peptide (Completed)   Lyme Ab/Western Blot Reflex (Completed)   Ehrlichia Antibody Panel (Completed)   Babesia microti Antibody Panel (Completed)   Rocky mtn spotted fvr abs pnl(IgG+IgM) (Completed)   Encounter for hepatitis C screening test for low risk patient       Labs drawn today. Await results.   Relevant Orders   Hepatitis C Antibody (Completed)   Elevated blood pressure reading       On HCTZ, will check microalbumin, will likely benefit from ACE-Inhibitor. Await results.    Relevant Orders   Microalbumin, Urine Waived (Completed)   Screening for cholesterol level       Labs drawn today. Await results.   Relevant Orders   Lipid Panel w/o Chol/HDL Ratio (Completed)       Follow up plan: Return in about 4 weeks (around 10/30/2018).

## 2018-10-04 LAB — CBC WITH DIFFERENTIAL/PLATELET
Basophils Absolute: 0.1 10*3/uL (ref 0.0–0.2)
Basos: 1 %
EOS (ABSOLUTE): 0.3 10*3/uL (ref 0.0–0.4)
Eos: 4 %
Hematocrit: 35.4 % (ref 34.0–46.6)
Hemoglobin: 11.9 g/dL (ref 11.1–15.9)
Immature Grans (Abs): 0 10*3/uL (ref 0.0–0.1)
Immature Granulocytes: 0 %
Lymphocytes Absolute: 1.6 10*3/uL (ref 0.7–3.1)
Lymphs: 17 %
MCH: 31.3 pg (ref 26.6–33.0)
MCHC: 33.6 g/dL (ref 31.5–35.7)
MCV: 93 fL (ref 79–97)
Monocytes Absolute: 0.5 10*3/uL (ref 0.1–0.9)
Monocytes: 5 %
Neutrophils Absolute: 6.9 10*3/uL (ref 1.4–7.0)
Neutrophils: 73 %
Platelets: 375 10*3/uL (ref 150–450)
RBC: 3.8 x10E6/uL (ref 3.77–5.28)
RDW: 11.7 % (ref 11.7–15.4)
WBC: 9.5 10*3/uL (ref 3.4–10.8)

## 2018-10-04 LAB — COMPREHENSIVE METABOLIC PANEL
ALT: 17 IU/L (ref 0–32)
AST: 17 IU/L (ref 0–40)
Albumin/Globulin Ratio: 2.7 — ABNORMAL HIGH (ref 1.2–2.2)
Albumin: 4.8 g/dL (ref 3.8–4.9)
Alkaline Phosphatase: 110 IU/L (ref 39–117)
BUN/Creatinine Ratio: 28 — ABNORMAL HIGH (ref 9–23)
BUN: 21 mg/dL (ref 6–24)
Bilirubin Total: <0.2 mg/dL (ref 0.0–1.2)
CO2: 24 mmol/L (ref 20–29)
Calcium: 9.9 mg/dL (ref 8.7–10.2)
Chloride: 102 mmol/L (ref 96–106)
Creatinine, Ser: 0.76 mg/dL (ref 0.57–1.00)
GFR calc Af Amer: 101 mL/min/{1.73_m2} (ref >59)
GFR calc non Af Amer: 87 mL/min/{1.73_m2} (ref >59)
Globulin, Total: 1.8 g/dL (ref 1.5–4.5)
Glucose: 111 mg/dL — ABNORMAL HIGH (ref 65–99)
Potassium: 4.2 mmol/L (ref 3.5–5.2)
Sodium: 141 mmol/L (ref 134–144)
Total Protein: 6.6 g/dL (ref 6.0–8.5)

## 2018-10-04 LAB — URIC ACID: Uric Acid: 7.8 mg/dL — ABNORMAL HIGH (ref 2.5–7.1)

## 2018-10-04 LAB — LIPID PANEL W/O CHOL/HDL RATIO
Cholesterol, Total: 193 mg/dL (ref 100–199)
HDL: 58 mg/dL (ref >39)
LDL Calculated: 81 mg/dL (ref 0–99)
Triglycerides: 270 mg/dL — ABNORMAL HIGH (ref 0–149)
VLDL Cholesterol Cal: 54 mg/dL — ABNORMAL HIGH (ref 5–40)

## 2018-10-04 LAB — TSH: TSH: 4.93 u[IU]/mL — ABNORMAL HIGH (ref 0.450–4.500)

## 2018-10-04 LAB — SPECIMEN STATUS REPORT

## 2018-10-04 LAB — EHRLICHIA ANTIBODY PANEL
E. Chaffeensis (HME) IgM Titer: NEGATIVE
E.Chaffeensis (HME) IgG: NEGATIVE
HGE IgG Titer: NEGATIVE
HGE IgM Titer: NEGATIVE

## 2018-10-04 LAB — ROCKY MTN SPOTTED FVR ABS PNL(IGG+IGM)
RMSF IgG: NEGATIVE
RMSF IgM: 0.22 index (ref 0.00–0.89)

## 2018-10-04 LAB — VITAMIN D 25 HYDROXY (VIT D DEFICIENCY, FRACTURES): Vit D, 25-Hydroxy: 25.5 ng/mL — ABNORMAL LOW (ref 30.0–100.0)

## 2018-10-04 LAB — HEPATITIS C ANTIBODY: Hep C Virus Ab: <0.1 s/co ratio (ref 0.0–0.9)

## 2018-10-04 LAB — LYME AB/WESTERN BLOT REFLEX
LYME DISEASE AB, QUANT, IGM: <0.8 index (ref 0.00–0.79)
Lyme IgG/IgM Ab: <0.91 {ISR} (ref 0.00–0.90)

## 2018-10-04 LAB — BRAIN NATRIURETIC PEPTIDE: BNP: 18.5 pg/mL (ref 0.0–100.0)

## 2018-10-04 LAB — BABESIA MICROTI ANTIBODY PANEL
Babesia microti IgG: <1:10 {titer}
Babesia microti IgM: <1:10 {titer}

## 2018-10-04 LAB — HIV ANTIBODY (ROUTINE TESTING W REFLEX): HIV Screen 4th Generation wRfx: NONREACTIVE

## 2018-10-05 ENCOUNTER — Encounter: Payer: Self-pay | Admitting: Family Medicine

## 2018-10-05 ENCOUNTER — Other Ambulatory Visit: Payer: Self-pay | Admitting: Family Medicine

## 2018-10-05 DIAGNOSIS — R609 Edema, unspecified: Secondary | ICD-10-CM

## 2018-10-05 NOTE — Assessment & Plan Note (Signed)
Rechecking labs today. Await results. Treat as needed.  °

## 2018-10-06 ENCOUNTER — Encounter: Payer: Self-pay | Admitting: Nurse Practitioner

## 2018-10-06 ENCOUNTER — Telehealth: Payer: Self-pay | Admitting: Nurse Practitioner

## 2018-10-06 NOTE — Telephone Encounter (Signed)
Copied from [place] [id number]. Topic: General - Other >> Oct 06, 2018  3:38 PM Yvette Rack wrote: Reason for CRM: Pt stated she needs a copy of the paperwork for her most recent labs. Pt stated she needs the paperwork to be as detailed as possible because what she sees on [facility] is not how the lab results are normally listed. Pt would like to be contacted once the lab results paperwork is ready for pick up. >> Oct 06, 2018  4:18 PM Stark Klein wrote: Pt would like a letter with her results sent to her PO Box so she can take it with her to another doctors appointment on Monday.

## 2018-10-07 NOTE — Telephone Encounter (Signed)
Called and left patient a VM on both numbers listed that results and letter were ready to be picked up.

## 2018-10-13 DIAGNOSIS — M2669 Other specified disorders of temporomandibular joint: Secondary | ICD-10-CM | POA: Diagnosis not present

## 2018-10-13 DIAGNOSIS — F319 Bipolar disorder, unspecified: Secondary | ICD-10-CM | POA: Diagnosis not present

## 2018-10-13 DIAGNOSIS — F1120 Opioid dependence, uncomplicated: Secondary | ICD-10-CM | POA: Diagnosis not present

## 2018-10-13 DIAGNOSIS — G894 Chronic pain syndrome: Secondary | ICD-10-CM | POA: Diagnosis not present

## 2018-10-31 ENCOUNTER — Other Ambulatory Visit: Payer: Self-pay | Admitting: Physician Assistant

## 2018-11-07 ENCOUNTER — Other Ambulatory Visit: Payer: Self-pay | Admitting: Psychiatry

## 2018-11-07 NOTE — Telephone Encounter (Signed)
Has appt 11/25/2018

## 2018-11-11 ENCOUNTER — Ambulatory Visit: Payer: PPO | Admitting: Physician Assistant

## 2018-11-12 DIAGNOSIS — G894 Chronic pain syndrome: Secondary | ICD-10-CM | POA: Diagnosis not present

## 2018-11-12 DIAGNOSIS — F319 Bipolar disorder, unspecified: Secondary | ICD-10-CM | POA: Diagnosis not present

## 2018-11-12 DIAGNOSIS — M2669 Other specified disorders of temporomandibular joint: Secondary | ICD-10-CM | POA: Diagnosis not present

## 2018-11-12 DIAGNOSIS — F1120 Opioid dependence, uncomplicated: Secondary | ICD-10-CM | POA: Diagnosis not present

## 2018-11-12 DIAGNOSIS — Z79899 Other long term (current) drug therapy: Secondary | ICD-10-CM | POA: Diagnosis not present

## 2018-11-17 ENCOUNTER — Telehealth: Payer: Self-pay | Admitting: Family Medicine

## 2018-11-17 NOTE — Telephone Encounter (Signed)
Spoke with patient, she does not want to schedule a follow up at this time.

## 2018-11-17 NOTE — Telephone Encounter (Addendum)
She does not need to take a 2nd blood pressure medicine if she doesn't want to- it's just to protect her kidneys.  She is due for a follow up. I'm working on her form.

## 2018-11-17 NOTE — Telephone Encounter (Signed)
Patient said she wants to hold off taking a 2nd BP medication because the first one is acting fine.  Also the patient stated she would like the status of a [facility] form she faxed to the practice two weeks ago to be filled out.  Please return her call.

## 2018-11-19 DIAGNOSIS — Z20828 Contact with and (suspected) exposure to other viral communicable diseases: Secondary | ICD-10-CM | POA: Diagnosis not present

## 2018-11-20 ENCOUNTER — Telehealth: Payer: Self-pay | Admitting: Family Medicine

## 2018-11-20 ENCOUNTER — Encounter: Payer: Self-pay | Admitting: Family Medicine

## 2018-11-20 ENCOUNTER — Ambulatory Visit (INDEPENDENT_AMBULATORY_CARE_PROVIDER_SITE_OTHER): Payer: PPO | Admitting: Unknown Physician Specialty

## 2018-11-20 ENCOUNTER — Encounter: Payer: Self-pay | Admitting: Unknown Physician Specialty

## 2018-11-20 ENCOUNTER — Other Ambulatory Visit: Payer: Self-pay

## 2018-11-20 VITALS — BP 137/71 | HR 71 | Temp 99.5°F

## 2018-11-20 DIAGNOSIS — Z20822 Contact with and (suspected) exposure to covid-19: Secondary | ICD-10-CM

## 2018-11-20 DIAGNOSIS — Z20828 Contact with and (suspected) exposure to other viral communicable diseases: Secondary | ICD-10-CM | POA: Diagnosis not present

## 2018-11-20 DIAGNOSIS — J069 Acute upper respiratory infection, unspecified: Secondary | ICD-10-CM | POA: Diagnosis not present

## 2018-11-20 NOTE — Progress Notes (Signed)
BP 137/71   Pulse 71   Temp 99.5 F (37.5 C) (Oral)    Subjective:    Patient ID: Katherine James, female    DOB: 20-Apr-1960, 58 y.o.   MRN: [id number]  HPI: Katherine James is a 58 y.o. female  Chief Complaint  Patient presents with  . URI    pt states she has been exposed to [place], currently awaiting test result. States she has had a fever, aches, and sore throat. 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  Today, she has a headache, facial pain, aching in back and arms, nasal congestion and sore throat.  These symptoms started today and reports exposure 3 days ago.  She denies SOB at this time.  She cares for there mom and states the whole family and caregivers all have Covid.   She is particularly concerned that she is the only caregiver for her mom.  She had Covid testing which she says will not come back till Monday.    She is asking for Hydroxychloroquine, Zpack, and steroids.  Discussed with Dr. Wynetta Emery   . This visit was completed via telephone due to the restrictions of the COVID-19 pandemic. All issues as above were discussed and addressed but no physical exam was performed. If it was felt that the patient should be evaluated in the office, they were directed there. The patient verbally consented to this visit. Patient was unable to complete an audio/visual visit due to {Blank single:19197::"Technical difficulties,Lack of internet . Location of the patient: home . Location of the provider: work . Those involved with this call:  . Provider: Kathrine Haddock, DNP . CMA: Yvonna Alanis, CMA . Front Desk/Registration: Jill Side  . Time spent on call: 10 minutes on the phone discussing health concerns. 5 minutes total spent in review of patient's record and preparation of their chart.   Relevant past medical, surgical, family and social history reviewed and updated as indicated. Interim medical history since our last visit reviewed.  Allergies and medications reviewed and updated.  Review of Systems  Per HPI unless specifically indicated above     Objective:    BP 137/71   Pulse 71   Temp 99.5 F (37.5 C) (Oral)   Wt Readings from Last 3 Encounters:  10/02/18 218 lb (98.9 kg)  09/11/18 218 lb (98.9 kg)  05/09/18 200 lb (90.7 kg)    Physical Exam Neurological:     Mental Status: She is alert.  Psychiatric:        Mood and Affect: Mood normal.        Thought Content: Thought content normal.     Results for orders placed or performed in visit on 10/02/18  Comprehensive metabolic panel  Result Value Ref Range   Glucose 111 (H) 65 - 99 mg/dL   BUN 21 6 - 24 mg/dL   Creatinine, Ser 0.76 0.57 - 1.00 mg/dL   GFR calc non Af Amer 87 >59 mL/min/1.73   GFR calc Af Amer 101 >59 mL/min/1.73   BUN/Creatinine Ratio 28 (H) 9 - 23   Sodium 141 134 - 144 mmol/L   Potassium 4.2 3.5 - 5.2 mmol/L   Chloride 102 96 - 106 mmol/L   CO2 24 20 - 29 mmol/L   Calcium 9.9 8.7 - 10.2 mg/dL   Total Protein 6.6 6.0 - 8.5 g/dL   Albumin 4.8 3.8 - 4.9 g/dL   Globulin, Total 1.8 1.5 -  4.5 g/dL   Albumin/Globulin Ratio 2.7 (H) 1.2 - 2.2   Bilirubin Total <0.2 0.0 - 1.2 mg/dL   Alkaline Phosphatase 110 39 - 117 IU/L   AST 17 0 - 40 IU/L   ALT 17 0 - 32 IU/L  CBC with Differential/Platelet  Result Value Ref Range   WBC 9.5 3.4 - 10.8 x10E3/uL   RBC 3.80 3.77 - 5.28 x10E6/uL   Hemoglobin 11.9 11.1 - 15.9 g/dL   Hematocrit 35.4 34.0 - 46.6 %   MCV 93 79 - 97 fL   MCH 31.3 26.6 - 33.0 pg   MCHC 33.6 31.5 - 35.7 g/dL   RDW 11.7 11.7 - 15.4 %   Platelets 375 150 - 450 x10E3/uL   Neutrophils 73 Not Estab. %   Lymphs 17 Not Estab. %   Monocytes 5 Not Estab. %   Eos 4 Not Estab. %   Basos 1 Not Estab. %   Neutrophils Absolute 6.9 1.4 - 7.0 x10E3/uL   Lymphocytes Absolute 1.6 0.7 - 3.1 x10E3/uL   Monocytes Absolute 0.5 0.1 - 0.9 x10E3/uL   EOS (ABSOLUTE) 0.3 0.0 - 0.4 x10E3/uL   Basophils Absolute 0.1 0.0 - 0.2 x10E3/uL    Immature Granulocytes 0 Not Estab. %   Immature Grans (Abs) 0.0 0.0 - 0.1 x10E3/uL  Bayer DCA Hb A1c Waived  Result Value Ref Range   HB A1C (BAYER DCA - WAIVED) 4.9 <7.0 %  HIV Antibody (routine testing w rflx)  Result Value Ref Range   HIV Screen 4th Generation wRfx Non Reactive Non Reactive  Lipid Panel w/o Chol/HDL Ratio  Result Value Ref Range   Cholesterol, Total 193 100 - 199 mg/dL   Triglycerides 270 (H) 0 - 149 mg/dL   HDL 58 >39 mg/dL   VLDL Cholesterol Cal 54 (H) 5 - 40 mg/dL   LDL Calculated 81 0 - 99 mg/dL  Microalbumin, Urine Waived  Result Value Ref Range   Microalb, Ur Waived 10 0 - 19 mg/L   Creatinine, Urine Waived 10 10 - 300 mg/dL   Microalb/Creat Ratio 30-300 (H) <30 mg/g  TSH  Result Value Ref Range   TSH 4.930 (H) 0.450 - 4.500 uIU/mL  UA/M w/rflx Culture, Routine   Specimen: Urine   URINE  Result Value Ref Range   Specific Gravity, UA 1.015 1.005 - 1.030   pH, UA 7.0 5.0 - 7.5   Color, UA Yellow Yellow   Appearance Ur Clear Clear   Leukocytes,UA Negative Negative   Protein,UA Negative Negative/Trace   Glucose, UA Negative Negative   Ketones, UA Negative Negative   RBC, UA Negative Negative   Bilirubin, UA Negative Negative   Urobilinogen, Ur 0.2 0.2 - 1.0 mg/dL   Nitrite, UA Negative Negative  Uric acid  Result Value Ref Range   Uric Acid 7.8 (H) 2.5 - 7.1 mg/dL  VITAMIN D 25 Hydroxy (Vit-D Deficiency, Fractures)  Result Value Ref Range   Vit D, 25-Hydroxy 25.5 (L) 30.0 - 100.0 ng/mL  Hepatitis C Antibody  Result Value Ref Range   Hep C Virus Ab <0.1 0.0 - 0.9 s/co ratio  B Nat Peptide  Result Value Ref Range   BNP 18.5 0.0 - 100.0 pg/mL  Lyme Ab/Western Blot Reflex  Result Value Ref Range   Lyme IgG/IgM Ab <0.91 0.00 - 0.90 ISR   LYME DISEASE AB, QUANT, IGM <0.80 0.00 - [id number] index  Ehrlichia Antibody Panel  Result Value Ref Range   E.Chaffeensis (HME) IgG Negative  Neg:<1:64   E. Chaffeensis (HME) IgM Titer Negative Neg:<1:20   HGE  IgG Titer Negative Neg:<1:64   HGE IgM Titer Negative Neg:<1:20  Babesia microti Antibody Panel  Result Value Ref Range   Babesia microti IgM <1:10 Neg:<1:10   Babesia microti IgG <1:10 Neg:<1:10  Rocky mtn spotted fvr abs pnl(IgG+IgM)  Result Value Ref Range   RMSF IgG Negative Negative   RMSF IgM 0.22 0.00 - 0.89 index  Specimen status report  Result Value Ref Range   specimen status report Comment       Assessment & Plan:   Problem List Items Addressed This Visit    None    Visit Diagnoses    Exposure to Covid-19 Virus    -  Primary   Discussed with Dr. Wynetta Emery and told Hydroxyquine and Z pack are not approved treatments for Covid and not prescribed in this clinic.Recommended supportive care   Viral upper respiratory tract infection         Supportive care and self-isolation for a minimum of 2 weeks, even before Covid results.  Discussed going to the ER for any SOB.      Follow up plan: Return if symptoms worsen or fail to improve.

## 2018-11-20 NOTE — Telephone Encounter (Signed)
Pt called in to be advised. (apt scheduled) pt says that she was exposed to covid by her mothers caretaker. Pt says that she is starting to have symptoms. (fever) and would like to know what should she do?

## 2018-11-20 NOTE — Telephone Encounter (Signed)
Scheduled her with Katherine James this evening. She is awaiting test results from her mom's home health doctor. Wants to inquire about a prescription. Please advise if something different needs to be done

## 2018-11-23 DIAGNOSIS — J069 Acute upper respiratory infection, unspecified: Secondary | ICD-10-CM | POA: Diagnosis not present

## 2018-11-23 DIAGNOSIS — Z20828 Contact with and (suspected) exposure to other viral communicable diseases: Secondary | ICD-10-CM | POA: Diagnosis not present

## 2018-11-24 DIAGNOSIS — Z20828 Contact with and (suspected) exposure to other viral communicable diseases: Secondary | ICD-10-CM | POA: Diagnosis not present

## 2018-11-25 ENCOUNTER — Ambulatory Visit: Payer: PPO | Admitting: Physician Assistant

## 2018-11-26 ENCOUNTER — Encounter (INDEPENDENT_AMBULATORY_CARE_PROVIDER_SITE_OTHER): Payer: Self-pay

## 2018-12-01 ENCOUNTER — Other Ambulatory Visit: Payer: Self-pay | Admitting: Family Medicine

## 2018-12-01 NOTE — Telephone Encounter (Signed)
Requested medication (s) are due for refill today: yes  Requested medication (s) are on the active medication list: yes  Last refill:  11/06/2018  Future visit scheduled: no  Notes to clinic:  Refill cannot be delegated    Requested Prescriptions  Pending Prescriptions Disp Refills   ondansetron (ZOFRAN) 4 MG tablet [Pharmacy Med Name: ONDANSETRON HCL 4 MG TAB] 45 tablet     Sig: TAKE 1 AND 1/2 TABLETS BY MOUTH DAILY FOR NAUSEA     Not Delegated - Gastroenterology: Antiemetics Failed - 12/01/2018 11:04 AM      Failed - This refill cannot be delegated      Passed - Valid encounter within last 6 months    Recent Outpatient Visits          1 week ago Exposure to [place] Kathrine Haddock, NP   2 months ago Peripheral edema   [place], DO   2 months ago Viral upper respiratory tract infection   [place], DO   2 months ago Erroneous encounter - disregard   [place], [place]   2 months ago Peripheral edema   [place], [place], DO

## 2018-12-02 DIAGNOSIS — J069 Acute upper respiratory infection, unspecified: Secondary | ICD-10-CM | POA: Diagnosis not present

## 2018-12-02 DIAGNOSIS — I10 Essential (primary) hypertension: Secondary | ICD-10-CM | POA: Diagnosis not present

## 2018-12-02 DIAGNOSIS — M797 Fibromyalgia: Secondary | ICD-10-CM | POA: Diagnosis not present

## 2018-12-02 DIAGNOSIS — Z636 Dependent relative needing care at home: Secondary | ICD-10-CM | POA: Diagnosis not present

## 2018-12-02 DIAGNOSIS — Z20828 Contact with and (suspected) exposure to other viral communicable diseases: Secondary | ICD-10-CM | POA: Diagnosis not present

## 2018-12-02 DIAGNOSIS — R600 Localized edema: Secondary | ICD-10-CM | POA: Diagnosis not present

## 2018-12-02 DIAGNOSIS — G894 Chronic pain syndrome: Secondary | ICD-10-CM | POA: Diagnosis not present

## 2018-12-04 DIAGNOSIS — Z20828 Contact with and (suspected) exposure to other viral communicable diseases: Secondary | ICD-10-CM | POA: Diagnosis not present

## 2018-12-04 DIAGNOSIS — R05 Cough: Secondary | ICD-10-CM | POA: Diagnosis not present

## 2018-12-05 DIAGNOSIS — Z20828 Contact with and (suspected) exposure to other viral communicable diseases: Secondary | ICD-10-CM | POA: Diagnosis not present

## 2018-12-08 DIAGNOSIS — I10 Essential (primary) hypertension: Secondary | ICD-10-CM | POA: Diagnosis not present

## 2018-12-08 DIAGNOSIS — Z5321 Procedure and treatment not carried out due to patient leaving prior to being seen by health care provider: Secondary | ICD-10-CM | POA: Diagnosis not present

## 2018-12-08 DIAGNOSIS — I4430 Unspecified atrioventricular block: Secondary | ICD-10-CM | POA: Diagnosis not present

## 2018-12-08 DIAGNOSIS — R079 Chest pain, unspecified: Secondary | ICD-10-CM | POA: Diagnosis not present

## 2018-12-10 DIAGNOSIS — Z79899 Other long term (current) drug therapy: Secondary | ICD-10-CM | POA: Diagnosis not present

## 2018-12-10 DIAGNOSIS — G894 Chronic pain syndrome: Secondary | ICD-10-CM | POA: Diagnosis not present

## 2018-12-10 DIAGNOSIS — M2669 Other specified disorders of temporomandibular joint: Secondary | ICD-10-CM | POA: Diagnosis not present

## 2018-12-10 DIAGNOSIS — F319 Bipolar disorder, unspecified: Secondary | ICD-10-CM | POA: Diagnosis not present

## 2018-12-10 DIAGNOSIS — F1120 Opioid dependence, uncomplicated: Secondary | ICD-10-CM | POA: Diagnosis not present

## 2018-12-18 ENCOUNTER — Other Ambulatory Visit: Payer: Self-pay | Admitting: Physician Assistant

## 2018-12-18 NOTE — Telephone Encounter (Signed)
appt 11/11

## 2018-12-19 ENCOUNTER — Other Ambulatory Visit: Payer: Self-pay | Admitting: Physician Assistant

## 2018-12-24 ENCOUNTER — Other Ambulatory Visit: Payer: Self-pay | Admitting: Physician Assistant

## 2018-12-24 DIAGNOSIS — R5381 Other malaise: Secondary | ICD-10-CM | POA: Diagnosis not present

## 2018-12-24 DIAGNOSIS — Z79899 Other long term (current) drug therapy: Secondary | ICD-10-CM

## 2018-12-24 DIAGNOSIS — N951 Menopausal and female climacteric states: Secondary | ICD-10-CM | POA: Diagnosis not present

## 2018-12-24 DIAGNOSIS — E782 Mixed hyperlipidemia: Secondary | ICD-10-CM | POA: Diagnosis not present

## 2018-12-24 DIAGNOSIS — E063 Autoimmune thyroiditis: Secondary | ICD-10-CM | POA: Diagnosis not present

## 2018-12-25 ENCOUNTER — Other Ambulatory Visit: Payer: Self-pay | Admitting: Physician Assistant

## 2018-12-25 DIAGNOSIS — Z79899 Other long term (current) drug therapy: Secondary | ICD-10-CM

## 2018-12-25 LAB — LITHIUM LEVEL: Lithium Lvl: 1.3 mmol/L — ABNORMAL HIGH (ref 0.6–1.2)

## 2018-12-25 NOTE — Progress Notes (Signed)
Pt. Made aware and verbalized understanding. Pt. Has started having ankle swelling (may be due to BP) and producing microproteins in her kidney's. Could this be from the elevated Lithium level?

## 2018-12-25 NOTE — Progress Notes (Signed)
I don't think it's related to the elevated Li, b/c it's not toxic.  And her kidney function tests were nl 2 months ago.

## 2018-12-25 NOTE — Progress Notes (Signed)
Confirm Li dose.  Should be 1200 mg.  Have her decrease to 900mg  (3 pills) qhs.  Repeat Li level in 7-10 days. Mail lab order to her.  Thanks

## 2018-12-26 NOTE — Progress Notes (Signed)
Spoke with pt. And made her aware.

## 2018-12-28 DIAGNOSIS — Z20828 Contact with and (suspected) exposure to other viral communicable diseases: Secondary | ICD-10-CM | POA: Diagnosis not present

## 2018-12-29 NOTE — Progress Notes (Signed)
Reviewed

## 2018-12-30 ENCOUNTER — Telehealth: Payer: Self-pay

## 2018-12-30 NOTE — Telephone Encounter (Signed)
Pt. Called to report since dropping Lithium to 900 Mg she has felt awful. Her vision has been blurry, headaches, not feeling well and irritable. She is going to have her blood drawn tomorrow. She is wanting to know if she can go down just 150 mg instead of the whole 300 Mg. Please advise.

## 2018-12-30 NOTE — Telephone Encounter (Signed)
Will review once lab results received.

## 2018-12-31 DIAGNOSIS — Z79899 Other long term (current) drug therapy: Secondary | ICD-10-CM | POA: Diagnosis not present

## 2019-01-01 NOTE — Telephone Encounter (Signed)
No updated lab results received yet

## 2019-01-02 ENCOUNTER — Telehealth: Payer: Self-pay

## 2019-01-02 ENCOUNTER — Other Ambulatory Visit: Payer: Self-pay

## 2019-01-02 DIAGNOSIS — F3181 Bipolar II disorder: Secondary | ICD-10-CM | POA: Diagnosis not present

## 2019-01-02 DIAGNOSIS — Z639 Problem related to primary support group, unspecified: Secondary | ICD-10-CM | POA: Diagnosis not present

## 2019-01-02 DIAGNOSIS — F4321 Adjustment disorder with depressed mood: Secondary | ICD-10-CM | POA: Diagnosis not present

## 2019-01-02 LAB — LITHIUM LEVEL: Lithium Lvl: 0.8 mmol/L (ref 0.6–1.2)

## 2019-01-02 MED ORDER — LITHIUM CARBONATE 150 MG PO CAPS: 150.0000 mg | ORAL_CAPSULE | Freq: Three times a day (TID) | ORAL | 0 refills | Status: DC

## 2019-01-02 NOTE — Telephone Encounter (Signed)
Left voice mail to call back 

## 2019-01-02 NOTE — Progress Notes (Signed)
On 900 mg now. Reports feeling more irritable, h/a.  She can increase to 1,050 mg. No new lab orders.  I'll discuss w/ her at appt.

## 2019-01-02 NOTE — Progress Notes (Signed)
Patient notified of dose increase to 1050 mg/day

## 2019-01-02 NOTE — Telephone Encounter (Signed)
Yes, ok to take Lithium 1,050mg .  If she needs Rx for 150 mg, ok to send in.  Thanks

## 2019-01-02 NOTE — Telephone Encounter (Signed)
Please send 150 Mg Lithium to [facility] [facility].

## 2019-01-02 NOTE — Telephone Encounter (Signed)
Rx submitted

## 2019-01-02 NOTE — Telephone Encounter (Signed)
Patient notified of dose increase and lab results, new Rx submitted for 150 mg capsule to take with 900 mg.

## 2019-01-05 ENCOUNTER — Telehealth: Payer: Self-pay | Admitting: Physician Assistant

## 2019-01-05 ENCOUNTER — Other Ambulatory Visit: Payer: Self-pay

## 2019-01-05 MED ORDER — LITHIUM CARBONATE 150 MG PO CAPS: 150.0000 mg | ORAL_CAPSULE | Freq: Every day | ORAL | 0 refills | Status: DC

## 2019-01-05 NOTE — Telephone Encounter (Signed)
Thank you for the update. Will change order for lithium 150 mg 1 at hs, she is suppose to take a total of 1050 mg/day.

## 2019-01-05 NOTE — Telephone Encounter (Signed)
The RX for lithium 150mg  is set up for three times a day. I think it needs to be 150mg  one time a day- to add it to the 900mg  she is already taking. If so, it needs to be corrected at the pharmacy and notify pt. She is only taking 1- 150mg  a day.

## 2019-01-05 NOTE — Telephone Encounter (Signed)
Katherine James called and said she thinks she is be overdosed in Lithium.  Requests an urgent call to discuss.  Does have an appt 11/11

## 2019-01-06 ENCOUNTER — Other Ambulatory Visit: Payer: Self-pay

## 2019-01-06 ENCOUNTER — Telehealth: Payer: Self-pay | Admitting: Physician Assistant

## 2019-01-06 MED ORDER — LITHIUM CARBONATE 300 MG PO CAPS: 900.0000 mg | ORAL_CAPSULE | Freq: Every day | ORAL | 0 refills | Status: DC

## 2019-01-06 NOTE — Telephone Encounter (Signed)
Pharmacy call stating they need clarification on the Lithium directions.

## 2019-01-06 NOTE — Telephone Encounter (Signed)
Called pharmacy for clarification, they last had the 300 mg Lithium 4 at hs from Sept. Requesting an updated Rx for her Lithium 300 mg take 3 at hs along with a 150 mg to equal 1050 mg per day. Patient's dosage has been up and down then back up in the past month due to her lithium levels and moods. Will update the 300 mg Lithium.

## 2019-01-07 ENCOUNTER — Ambulatory Visit (INDEPENDENT_AMBULATORY_CARE_PROVIDER_SITE_OTHER): Payer: PPO | Admitting: Physician Assistant

## 2019-01-07 ENCOUNTER — Other Ambulatory Visit: Payer: Self-pay

## 2019-01-07 ENCOUNTER — Encounter: Payer: Self-pay | Admitting: Physician Assistant

## 2019-01-07 DIAGNOSIS — Z79899 Other long term (current) drug therapy: Secondary | ICD-10-CM

## 2019-01-07 DIAGNOSIS — F4310 Post-traumatic stress disorder, unspecified: Secondary | ICD-10-CM

## 2019-01-07 DIAGNOSIS — F900 Attention-deficit hyperactivity disorder, predominantly inattentive type: Secondary | ICD-10-CM | POA: Diagnosis not present

## 2019-01-07 DIAGNOSIS — F319 Bipolar disorder, unspecified: Secondary | ICD-10-CM

## 2019-01-07 DIAGNOSIS — G4700 Insomnia, unspecified: Secondary | ICD-10-CM

## 2019-01-07 MED ORDER — BELSOMRA 20 MG PO TABS: 20.0000 mg | ORAL_TABLET | Freq: Every evening | ORAL | 2 refills | Status: DC | PRN

## 2019-01-07 MED ORDER — AMPHETAMINE-DEXTROAMPHETAMINE 20 MG PO TABS: 10.0000 mg | ORAL_TABLET | Freq: Two times a day (BID) | ORAL | 0 refills | Status: DC

## 2019-01-07 NOTE — Progress Notes (Signed)
[facility]  Patient ID: Katherine James,  MRN: [id number]  PCP: Valerie Roys, DO  Date of Evaluation: 01/07/2019 Time spent:15 minutes  Chief Complaint:  Chief Complaint    Anxiety; Depression; Follow-up     Virtual Visit via Telephone Note  I connected with patient by a video enabled telemedicine application or telephone, with their informed consent, and verified patient privacy and that I am speaking with the correct person using two identifiers.  I am private, in my office and the patient is home.   I discussed the limitations, risks, security and privacy concerns of performing an evaluation and management service by telephone and the availability of in person appointments. I also discussed with the patient that there may be a patient responsible charge related to this service. The patient expressed understanding and agreed to proceed.   I discussed the assessment and treatment plan with the patient. The patient was provided an opportunity to ask questions and all were answered. The patient agreed with the plan and demonstrated an understanding of the instructions.   The patient was advised to call back or seek an in-person evaluation if the symptoms worsen or if the condition fails to improve as anticipated.  I provided 15 minutes of non-face-to-face time during this encounter.  HISTORY/CURRENT STATUS: HPI  For routine med check.  A few weeks ago, I decreased the Li b/c level 1.3, from 1200 to 900. Level went to 0.8.  She had dizziness, visual changes, headache, so she called to see if we could add 150 mg back in, so we did a few days ago. She's starting to feel a little better. If it hadn't been for this problem, "I'd be doing good."  She is still taking care of her mom.  She's 91, and pt stays w/ her. Has CNAs helping too. Mom had covid and so did pt, but they recovered well. Mom has dementia too.  Not sleeping well. Had been using Belsomra that she had told  me, using 1/2 from her friend. Now her friend is using the entire pill so she needs Rx.   Patient denies loss of interest in usual activities and is able to enjoy things.  Denies decreased energy or motivation.  Appetite has not changed.  No extreme sadness, tearfulness, or feelings of hopelessness.  Denies any changes in concentration, making decisions or remembering things.  Denies suicidal or homicidal thoughts.  Patient denies increased energy with decreased need for sleep, no increased talkativeness, no racing thoughts, no impulsivity or risky behaviors, no increased spending, no increased libido, no grandiosity.  Denies dizziness, syncope, seizures, numbness, tingling, tremor, tics, unsteady gait, slurred speech, confusion. Denies muscle or joint pain, stiffness, or dystonia.  Individual Medical History/ Review of Systems: Changes? :No   Allergies: Alprazolam, Amoxicillin-pot clavulanate, Celecoxib, Chlorpromazine hcl, Codeine, Depakote [divalproex sodium], Desipramine, Diclofenac sodium, Doxycycline, Etodolac, Fluoxetine hcl, Fluticasone propionate, Ibuprofen, Influenza vaccines, Iodine, Ketorolac tromethamine, Levofloxacin, Meperidine hcl, Nabumetone, Naproxen, Neurontin [gabapentin], Olive oil, Oxaprozin, Paroxetine, Phenergan [promethazine hcl], Piroxicam, Pneumococcal vaccines, Rofecoxib, Skelaxin [metaxalone], Sulfamethoxazole-trimethoprim, Sulfasalazine, Sulindac, Symbicort [budesonide-formoterol fumarate], Terfenadine, Tetracycline, Tramadol hcl, Wellbutrin [bupropion], and Zinc  Current Medications:  Current Outpatient Medications:  .  [START ON 01/17/2019] amphetamine-dextroamphetamine (ADDERALL) 20 MG tablet, Take 0.5 tablets (10 mg total) by mouth 2 (two) times daily., Disp: 30 tablet, Rfl: 0 .  carisoprodol (SOMA) 350 MG tablet, Take 3 tablets daily as needed for muscle spasms, Disp: , Rfl:  .  hydrochlorothiazide (HYDRODIURIL) 25 MG tablet, TAKE 1  TABLET BY MOUTH ONCE DAILY.,  Disp: 30 tablet, Rfl: 1 .  lamoTRIgine (LAMICTAL) 200 MG tablet, Take 1.5 tablets (300 mg total) by mouth daily., Disp: 135 tablet, Rfl: 1 .  levothyroxine (SYNTHROID, LEVOTHROID) 25 MCG tablet, Take 25 mcg by mouth daily before breakfast., Disp: , Rfl:  .  lithium carbonate 150 MG capsule, Take 1 capsule (150 mg total) by mouth at bedtime., Disp: 90 capsule, Rfl: 0 .  lithium carbonate 300 MG capsule, Take 3 capsules (900 mg total) by mouth at bedtime. Take with 150 mg capsule., Disp: 270 capsule, Rfl: 0 .  ondansetron (ZOFRAN) 4 MG tablet, TAKE 1 AND 1/2 TABLETS BY MOUTH DAILY FOR NAUSEA, Disp: 45 tablet, Rfl: 0 .  oxyCODONE (OXY IR/ROXICODONE) 5 MG immediate release tablet, Take 10 mg daily as needed by mouth for severe pain or breakthrough pain. , Disp: , Rfl:  .  pregabalin (LYRICA) 25 MG capsule, Take 25 mg by mouth 2 (two) times daily., Disp: , Rfl:  .  progesterone (PROMETRIUM) 200 MG capsule, Take 200 mg by mouth daily., Disp: , Rfl:  .  Tavaborole (KERYDIN) 5 % SOLN, Apply topically., Disp: , Rfl:  .  UNABLE TO FIND, DHEA SR (compound)- take 17 mg (1 tablet) daily except Saturday and Sunday, Disp: , Rfl:  .  UNABLE TO FIND, Hormone Topical Cream (Compound): Bi-Est (8:2) 1 mg, apply 1 gram every morning, Disp: , Rfl:  .  UNABLE TO FIND, Fentenyl Pain Patch, apply one 12 mcg patch every 3 days for chronic pain, Disp: , Rfl:  .  UNABLE TO FIND, Nature's Thryoid- take 3 g M, W and F, and 2 g every other day, Disp: , Rfl:  .  UNABLE TO FIND, Progesterone SR 300 mg (compound), take 1 tablet daily at bedtime, Disp: , Rfl:  .  UNABLE TO FIND, Seroquel (Compound)- 8.3 mg per mL, take 1 mL daily at bedtime, Disp: , Rfl:  .  UNABLE TO FIND, Pregnenolone 90 mg (compound), take 1 tablet daily, Disp: , Rfl:  .  UNABLE TO FIND, Testosterone Cream 0.5%- 1g/22mL, Disp: , Rfl:  .  Suvorexant (BELSOMRA) 20 MG TABS, Take 20 mg by mouth at bedtime as needed., Disp: 30 tablet, Rfl: 2 Medication Side Effects:  none  Family Medical/ Social History: Changes? No  MENTAL HEALTH EXAM:  There were no vitals taken for this visit.There is no height or weight on file to calculate BMI.  General Appearance: unable to assess  Eye Contact:  unable to assess  Speech:  Clear and Coherent  Volume:  Normal  Mood:  Euthymic  Affect:  unable to assess  Thought Process:  Goal Directed  Orientation:  Full (Time, Place, and Person)  Thought Content: Logical   Suicidal Thoughts:  No  Homicidal Thoughts:  No  Memory:  WNL  Judgement:  Good  Insight:  Good  Psychomotor Activity:  unable to assess  Concentration:  Concentration: Good  Recall:  Good  Fund of Knowledge: Good  Language: Good  Assets:  Desire for Improvement  ADL's:  Intact  Cognition: WNL  Prognosis:  Good    DIAGNOSES:    ICD-10-CM   1. PTSD (post-traumatic stress disorder)  F43.10   2. Attention deficit hyperactivity disorder (ADHD), predominantly inattentive type  F90.0   3. Bipolar I disorder ([place])  F31.9   4. Insomnia, unspecified type  G47.00   5. Encounter for long-term (current) use of medications  Z79.899 Lithium level    Receiving  Psychotherapy: Yes  'can't remember her name'  RECOMMENDATIONS:  PDMP reviewed. Patient asked that I never decrease her lithium by more than 150 mg at a time ever again.  I will keep that in mind but have also told her that if level is toxic we have to do what we have to do. Continue lithium at 1050 mg daily. Continue Adderall 20 mg one half p.o. twice daily. Continue Lamictal 300 mg daily. Continue Belsomra 20 mg, she takes one half p.o. nightly. Continue Seroquel compounded 8.33 mg/cc daily. Continue counseling. Check lithium level in approximately 10 days. Return in 6 months.  Donnal Moat, PA-C

## 2019-01-08 DIAGNOSIS — R7309 Other abnormal glucose: Secondary | ICD-10-CM | POA: Diagnosis not present

## 2019-01-08 DIAGNOSIS — D649 Anemia, unspecified: Secondary | ICD-10-CM | POA: Diagnosis not present

## 2019-01-08 DIAGNOSIS — R7989 Other specified abnormal findings of blood chemistry: Secondary | ICD-10-CM | POA: Diagnosis not present

## 2019-01-08 DIAGNOSIS — E875 Hyperkalemia: Secondary | ICD-10-CM | POA: Diagnosis not present

## 2019-01-09 DIAGNOSIS — F4321 Adjustment disorder with depressed mood: Secondary | ICD-10-CM | POA: Diagnosis not present

## 2019-01-09 DIAGNOSIS — F3181 Bipolar II disorder: Secondary | ICD-10-CM | POA: Diagnosis not present

## 2019-01-09 DIAGNOSIS — Z639 Problem related to primary support group, unspecified: Secondary | ICD-10-CM | POA: Diagnosis not present

## 2019-01-12 DIAGNOSIS — M2669 Other specified disorders of temporomandibular joint: Secondary | ICD-10-CM | POA: Diagnosis not present

## 2019-01-12 DIAGNOSIS — F1120 Opioid dependence, uncomplicated: Secondary | ICD-10-CM | POA: Diagnosis not present

## 2019-01-12 DIAGNOSIS — G894 Chronic pain syndrome: Secondary | ICD-10-CM | POA: Diagnosis not present

## 2019-01-12 DIAGNOSIS — F319 Bipolar disorder, unspecified: Secondary | ICD-10-CM | POA: Diagnosis not present

## 2019-01-12 DIAGNOSIS — Z79899 Other long term (current) drug therapy: Secondary | ICD-10-CM | POA: Diagnosis not present

## 2019-01-16 DIAGNOSIS — F3181 Bipolar II disorder: Secondary | ICD-10-CM | POA: Diagnosis not present

## 2019-01-16 DIAGNOSIS — Z639 Problem related to primary support group, unspecified: Secondary | ICD-10-CM | POA: Diagnosis not present

## 2019-01-16 DIAGNOSIS — F4321 Adjustment disorder with depressed mood: Secondary | ICD-10-CM | POA: Diagnosis not present

## 2019-01-19 DIAGNOSIS — R739 Hyperglycemia, unspecified: Secondary | ICD-10-CM | POA: Diagnosis not present

## 2019-01-19 DIAGNOSIS — R7989 Other specified abnormal findings of blood chemistry: Secondary | ICD-10-CM | POA: Diagnosis not present

## 2019-01-30 DIAGNOSIS — F4321 Adjustment disorder with depressed mood: Secondary | ICD-10-CM | POA: Diagnosis not present

## 2019-01-30 DIAGNOSIS — F3181 Bipolar II disorder: Secondary | ICD-10-CM | POA: Diagnosis not present

## 2019-01-30 DIAGNOSIS — Z639 Problem related to primary support group, unspecified: Secondary | ICD-10-CM | POA: Diagnosis not present

## 2019-02-02 DIAGNOSIS — I10 Essential (primary) hypertension: Secondary | ICD-10-CM | POA: Diagnosis not present

## 2019-02-02 DIAGNOSIS — R600 Localized edema: Secondary | ICD-10-CM | POA: Diagnosis not present

## 2019-02-02 DIAGNOSIS — M797 Fibromyalgia: Secondary | ICD-10-CM | POA: Diagnosis not present

## 2019-02-02 DIAGNOSIS — R079 Chest pain, unspecified: Secondary | ICD-10-CM | POA: Diagnosis not present

## 2019-02-02 DIAGNOSIS — F3181 Bipolar II disorder: Secondary | ICD-10-CM | POA: Diagnosis not present

## 2019-02-02 DIAGNOSIS — Z636 Dependent relative needing care at home: Secondary | ICD-10-CM | POA: Diagnosis not present

## 2019-02-06 ENCOUNTER — Telehealth: Payer: Self-pay

## 2019-02-06 DIAGNOSIS — F3181 Bipolar II disorder: Secondary | ICD-10-CM | POA: Diagnosis not present

## 2019-02-06 DIAGNOSIS — F4321 Adjustment disorder with depressed mood: Secondary | ICD-10-CM | POA: Diagnosis not present

## 2019-02-06 DIAGNOSIS — Z639 Problem related to primary support group, unspecified: Secondary | ICD-10-CM | POA: Diagnosis not present

## 2019-02-06 NOTE — Telephone Encounter (Signed)
NOTES ON FILE FROM [facility] [phone], SENT REFERRAL TO SCHEDULING 

## 2019-02-11 DIAGNOSIS — F1120 Opioid dependence, uncomplicated: Secondary | ICD-10-CM | POA: Diagnosis not present

## 2019-02-11 DIAGNOSIS — M2669 Other specified disorders of temporomandibular joint: Secondary | ICD-10-CM | POA: Diagnosis not present

## 2019-02-11 DIAGNOSIS — F319 Bipolar disorder, unspecified: Secondary | ICD-10-CM | POA: Diagnosis not present

## 2019-02-11 DIAGNOSIS — G894 Chronic pain syndrome: Secondary | ICD-10-CM | POA: Diagnosis not present

## 2019-02-13 DIAGNOSIS — F4321 Adjustment disorder with depressed mood: Secondary | ICD-10-CM | POA: Diagnosis not present

## 2019-02-13 DIAGNOSIS — F3181 Bipolar II disorder: Secondary | ICD-10-CM | POA: Diagnosis not present

## 2019-02-13 DIAGNOSIS — Z639 Problem related to primary support group, unspecified: Secondary | ICD-10-CM | POA: Diagnosis not present

## 2019-02-17 DIAGNOSIS — K469 Unspecified abdominal hernia without obstruction or gangrene: Secondary | ICD-10-CM | POA: Insufficient documentation

## 2019-02-23 ENCOUNTER — Other Ambulatory Visit (HOSPITAL_COMMUNITY): Payer: Self-pay | Admitting: Family Medicine

## 2019-02-23 ENCOUNTER — Other Ambulatory Visit: Payer: Self-pay | Admitting: Family Medicine

## 2019-02-23 DIAGNOSIS — E049 Nontoxic goiter, unspecified: Secondary | ICD-10-CM

## 2019-02-23 DIAGNOSIS — N390 Urinary tract infection, site not specified: Secondary | ICD-10-CM | POA: Diagnosis not present

## 2019-02-23 DIAGNOSIS — R300 Dysuria: Secondary | ICD-10-CM | POA: Diagnosis not present

## 2019-02-24 DIAGNOSIS — F3181 Bipolar II disorder: Secondary | ICD-10-CM | POA: Diagnosis not present

## 2019-02-24 DIAGNOSIS — M797 Fibromyalgia: Secondary | ICD-10-CM | POA: Diagnosis not present

## 2019-02-24 DIAGNOSIS — R413 Other amnesia: Secondary | ICD-10-CM | POA: Diagnosis not present

## 2019-02-24 DIAGNOSIS — R2681 Unsteadiness on feet: Secondary | ICD-10-CM | POA: Diagnosis not present

## 2019-02-24 DIAGNOSIS — I10 Essential (primary) hypertension: Secondary | ICD-10-CM | POA: Diagnosis not present

## 2019-02-24 DIAGNOSIS — Z6833 Body mass index (BMI) 33.0-33.9, adult: Secondary | ICD-10-CM | POA: Diagnosis not present

## 2019-02-25 ENCOUNTER — Other Ambulatory Visit: Payer: Self-pay | Admitting: Physician Assistant

## 2019-02-25 NOTE — Telephone Encounter (Signed)
Next apt 05/11

## 2019-03-05 ENCOUNTER — Ambulatory Visit: Payer: PPO

## 2019-03-06 DIAGNOSIS — Z639 Problem related to primary support group, unspecified: Secondary | ICD-10-CM | POA: Diagnosis not present

## 2019-03-06 DIAGNOSIS — F3181 Bipolar II disorder: Secondary | ICD-10-CM | POA: Diagnosis not present

## 2019-03-06 DIAGNOSIS — F4321 Adjustment disorder with depressed mood: Secondary | ICD-10-CM | POA: Diagnosis not present

## 2019-03-11 DIAGNOSIS — F1120 Opioid dependence, uncomplicated: Secondary | ICD-10-CM | POA: Diagnosis not present

## 2019-03-11 DIAGNOSIS — M2669 Other specified disorders of temporomandibular joint: Secondary | ICD-10-CM | POA: Diagnosis not present

## 2019-03-11 DIAGNOSIS — G894 Chronic pain syndrome: Secondary | ICD-10-CM | POA: Diagnosis not present

## 2019-03-11 DIAGNOSIS — Z79899 Other long term (current) drug therapy: Secondary | ICD-10-CM | POA: Diagnosis not present

## 2019-03-11 DIAGNOSIS — F319 Bipolar disorder, unspecified: Secondary | ICD-10-CM | POA: Diagnosis not present

## 2019-03-13 DIAGNOSIS — F3181 Bipolar II disorder: Secondary | ICD-10-CM | POA: Diagnosis not present

## 2019-03-13 DIAGNOSIS — Z639 Problem related to primary support group, unspecified: Secondary | ICD-10-CM | POA: Diagnosis not present

## 2019-03-13 DIAGNOSIS — F4321 Adjustment disorder with depressed mood: Secondary | ICD-10-CM | POA: Diagnosis not present

## 2019-03-16 ENCOUNTER — Other Ambulatory Visit: Payer: Self-pay | Admitting: Physician Assistant

## 2019-03-17 DIAGNOSIS — R262 Difficulty in walking, not elsewhere classified: Secondary | ICD-10-CM | POA: Diagnosis not present

## 2019-03-17 DIAGNOSIS — M545 Low back pain: Secondary | ICD-10-CM | POA: Diagnosis not present

## 2019-03-17 DIAGNOSIS — M25552 Pain in left hip: Secondary | ICD-10-CM | POA: Diagnosis not present

## 2019-03-17 DIAGNOSIS — M25551 Pain in right hip: Secondary | ICD-10-CM | POA: Diagnosis not present

## 2019-03-17 DIAGNOSIS — M797 Fibromyalgia: Secondary | ICD-10-CM | POA: Diagnosis not present

## 2019-03-18 DIAGNOSIS — R262 Difficulty in walking, not elsewhere classified: Secondary | ICD-10-CM | POA: Diagnosis not present

## 2019-03-18 DIAGNOSIS — M25551 Pain in right hip: Secondary | ICD-10-CM | POA: Diagnosis not present

## 2019-03-18 DIAGNOSIS — M25552 Pain in left hip: Secondary | ICD-10-CM | POA: Diagnosis not present

## 2019-03-18 DIAGNOSIS — M797 Fibromyalgia: Secondary | ICD-10-CM | POA: Diagnosis not present

## 2019-03-18 DIAGNOSIS — M545 Low back pain: Secondary | ICD-10-CM | POA: Diagnosis not present

## 2019-03-19 ENCOUNTER — Ambulatory Visit: Payer: PPO

## 2019-03-20 DIAGNOSIS — Z639 Problem related to primary support group, unspecified: Secondary | ICD-10-CM | POA: Diagnosis not present

## 2019-03-20 DIAGNOSIS — F3181 Bipolar II disorder: Secondary | ICD-10-CM | POA: Diagnosis not present

## 2019-03-20 DIAGNOSIS — F4321 Adjustment disorder with depressed mood: Secondary | ICD-10-CM | POA: Diagnosis not present

## 2019-03-25 ENCOUNTER — Ambulatory Visit (INDEPENDENT_AMBULATORY_CARE_PROVIDER_SITE_OTHER): Payer: PPO | Admitting: Internal Medicine

## 2019-03-25 ENCOUNTER — Other Ambulatory Visit: Payer: Self-pay

## 2019-03-25 ENCOUNTER — Encounter: Payer: Self-pay | Admitting: Internal Medicine

## 2019-03-25 VITALS — BP 120/80 | HR 65 | Ht 68.0 in | Wt 218.0 lb

## 2019-03-25 DIAGNOSIS — R079 Chest pain, unspecified: Secondary | ICD-10-CM | POA: Diagnosis not present

## 2019-03-25 DIAGNOSIS — E039 Hypothyroidism, unspecified: Secondary | ICD-10-CM | POA: Diagnosis not present

## 2019-03-25 DIAGNOSIS — R5381 Other malaise: Secondary | ICD-10-CM | POA: Diagnosis not present

## 2019-03-25 DIAGNOSIS — E049 Nontoxic goiter, unspecified: Secondary | ICD-10-CM | POA: Diagnosis not present

## 2019-03-25 NOTE — Progress Notes (Signed)
New Outpatient Visit Date: 03/25/2019  Referring Provider: Melany Guernsey, PA [facility] [address] [place], [place] [address]  Chief Complaint: Shortness of breath and chest pain  HPI:  Katherine James is a 59 y.o. female who is being seen today for the evaluation of chest pain at the request of Katherine James. She has a history of hypertension, fibromyalgia, adrenal insufficiency, hypothyroidism, and bipolar disorder.  Katherine James reports a long history of episodic chest pain dating back at least 20 years.  She reports transient substernal pain that usually happens about twice a year.  The pain is usually self limited, though she notes a particularly severe episode in 11/2018 that led her to go to the [facility] ED (she left without being seen but says that her labs were okay - TnI was negative x 1).  She was alarmed because this was the first time that taking aspirin seemed to make her pain better (relief began within 1 minute of chewing aspirin).  She has not had any further chest pain.  Katherine James states that she had a stress test ~15 years ago by a cardiologist in [facility].  She believes it was normal and has not been seen by a cardiologist since.  Katherine James is also concerned about shortness of breath that only occurs when she wears a mask.  This accompanied by palpitations and lightheadedness.  It makes it difficult for her go to stores; she requests a medical exemption from having to wear a mask in public.  Katherine James reports significant ankle edema in the past, which resolved with addition of HCTZ.  Her blood pressure has also been well-controlled with HCTZ.  She is the process of being tapered of Adderall.  --------------------------------------------------------------------------------------------------  Cardiovascular History & Procedures: Cardiovascular Problems:  Chest pain  Shortness of breath  Risk Factors:  Hypertension  Cath/PCI:  None  CV  Surgery:  None  EP Procedures and Devices:  None  Non-Invasive Evaluation(s):  None available; patient reports negative stress test ~15 years ago.  Recent CV Pertinent Labs: Lab Results  Component Value Date   CHOL 193 10/02/2018   HDL 58 10/02/2018   LDLCALC 81 10/02/2018   TRIG 270 (H) 10/02/2018   BNP 18.5 10/02/2018   K 4.2 10/02/2018   K 3.8 04/14/2011   BUN 21 10/02/2018   BUN 14 04/14/2011   CREATININE 0.76 10/02/2018   CREATININE 0.75 04/14/2011    --------------------------------------------------------------------------------------------------  Past Medical History:  Diagnosis Date  . Adrenal failure ([place])    now classified as adrenal fatigue  . Allergy   . Bipolar 1 disorder ([place])   . Chondromalacia   . Fatty liver   . Fibromyalgia   . Hypothyroidism   . OSA (obstructive sleep apnea)    cpap  . TMJ syndrome     Past Surgical History:  Procedure Laterality Date  . ABDOMINAL HYSTERECTOMY  1998  . bilateral breast reduction  1999  . bilateral salpingoopherectomy  2003  . COLONOSCOPY WITH PROPOFOL N/A 07/10/2017   Procedure: COLONOSCOPY WITH PROPOFOL;  Surgeon: Manya Silvas, MD;  Location[facility] ENDOSCOPY;  Service: Endoscopy;  Laterality: N/A;  . HEMORRHOIDECTOMY WITH HEMORRHOID BANDING  1996  . LIGAMENT REPAIR  1982   left ankle  . LIPOSUCTION  1999   abdomen  . PLANTAR FASCIA RELEASE  1994, 1995   bilateral  . REDUCTION MAMMAPLASTY Bilateral 1999  . TONSILLECTOMY  1978  . [place]  Current Meds  Medication Sig  . amphetamine-dextroamphetamine (ADDERALL) 20 MG tablet TAKE ONE-HALF TABLET BY MOUTH 2 TIMES DAILY (Patient taking differently: Take 1/4 tablet four times a day.)  . carisoprodol (SOMA) 350 MG tablet Take 3 tablets daily  . hydrochlorothiazide (HYDRODIURIL) 25 MG tablet TAKE 1 TABLET BY MOUTH ONCE DAILY.  Marland Kitchen lamoTRIgine (LAMICTAL) 200 MG tablet TAKE ONE AND ONE-HALF TABLET BY MOUTH DAILY  . levothyroxine  (SYNTHROID, LEVOTHROID) 25 MCG tablet Take 25 mcg by mouth daily before breakfast.  . lithium carbonate 150 MG capsule Take 1 capsule (150 mg total) by mouth at bedtime.  Marland Kitchen lithium carbonate 300 MG capsule Take 3 capsules (900 mg total) by mouth at bedtime. Take with 150 mg capsule.  . ondansetron (ZOFRAN) 4 MG tablet TAKE 1 AND 1/2 TABLETS BY MOUTH DAILY FOR NAUSEA  . oxyCODONE (OXY IR/ROXICODONE) 5 MG immediate release tablet Take 10 mg daily as needed by mouth for severe pain or breakthrough pain.   . pregabalin (LYRICA) 25 MG capsule Take 25 mg by mouth 2 (two) times daily.  . progesterone (PROMETRIUM) 200 MG capsule Take 200 mg by mouth daily.  . Suvorexant (BELSOMRA) 20 MG TABS Take 20 mg by mouth at bedtime as needed.  . Tavaborole (KERYDIN) 5 % SOLN Apply topically.  Marland Kitchen UNABLE TO FIND DHEA SR (compound)- take 17 mg (1 tablet) daily except Saturday and Sunday  . UNABLE TO FIND Hormone Topical Cream (Compound): Bi-Est (8:2) 1 mg, apply 1 gram every morning  . UNABLE TO FIND Fentenyl Pain Patch, apply one 12 mcg patch every 3 days for chronic pain  . UNABLE TO FIND Nature's Thryoid- take 3 g M, W and F, and 2 g every other day  . UNABLE TO FIND Progesterone SR 300 mg (compound), take 1 tablet daily at bedtime  . UNABLE TO FIND Seroquel (Compound)- 8.3 mg per mL, take 1 mL daily at bedtime  . UNABLE TO FIND Pregnenolone 90 mg (compound), take 1 tablet daily  . UNABLE TO FIND Testosterone Cream 0.5%- 1g/55mL    Allergies: Alprazolam, Amoxicillin-pot clavulanate, Celecoxib, Chlorpromazine hcl, Codeine, Depakote [divalproex sodium], Desipramine, Diclofenac sodium, Doxycycline, Etodolac, Fluoxetine hcl, Fluticasone propionate, Ibuprofen, Influenza vaccines, Iodine, Ketorolac tromethamine, Levofloxacin, Meperidine hcl, Nabumetone, Naproxen, Neurontin [gabapentin], Olive oil, Oxaprozin, Paroxetine, Phenergan [promethazine hcl], Piroxicam, Pneumococcal vaccines, Rofecoxib, Skelaxin [metaxalone],  Sulfamethoxazole-trimethoprim, Sulfasalazine, Sulindac, Symbicort [budesonide-formoterol fumarate], Terfenadine, Tetracycline, Tramadol hcl, Wellbutrin [bupropion], and Zinc  Social History   Tobacco Use  . Smoking status: Never Smoker  . Smokeless tobacco: Never Used  . Tobacco comment: Pt was light smoker, socially  Substance Use Topics  . Alcohol use: Not Currently  . Drug use: No    Family History  Problem Relation Age of Onset  . Allergies Sister   . Asthma Other        nephew  . Heart disease Father 58       CABG x 2  . Skin cancer Father   . Parkinson's disease Father   . Skin cancer Mother   . Stroke Mother   . Obesity Brother   . Congestive Heart Failure Maternal Grandmother   . Stroke Maternal Grandfather   . Stomach cancer Neg Hx   . Colon cancer Neg Hx     Review of Systems: A 12-system review of systems was performed and was negative except as noted in the HPI.  --------------------------------------------------------------------------------------------------  Physical Exam: BP 120/80 (BP Location: Right Arm, Patient Position: Sitting, Cuff Size: Normal)   Pulse  65   Ht 5\' 8"  (1.727 m)   Wt 218 lb (98.9 kg)   BMI 33.15 kg/m   General:  NAD. HEENT: No conjunctival pallor or scleral icterus. Facemask in place, not covering her nose. Neck: Supple without lymphadenopathy, thyromegaly, JVD, or HJR. No carotid bruit. Lungs: Normal work of breathing. Clear to auscultation bilaterally without wheezes or crackles. Heart: Regular rate and rhythm without murmurs, rubs, or gallops. Non-displaced PMI. Abd: Bowel sounds present. Soft, NT/ND without hepatosplenomegaly Ext: No lower extremity edema. Radial, PT, and DP pulses are 2+ bilaterally Skin: Warm and dry without rash. Neuro: CNIII-XII intact. Strength and fine-touch sensation intact in upper and lower extremities bilaterally. Psych: Normal mood and affect.  EKG:  NSR with 1st degree AV block (PR interval  214 ms).  Otherwise, no significant abnormality.  Lab Results  Component Value Date   WBC 9.5 10/02/2018   HGB 11.9 10/02/2018   HCT 35.4 10/02/2018   MCV 93 10/02/2018   PLT 375 10/02/2018    Lab Results  Component Value Date   NA 141 10/02/2018   K 4.2 10/02/2018   CL 102 10/02/2018   CO2 24 10/02/2018   BUN 21 10/02/2018   CREATININE 0.76 10/02/2018   GLUCOSE 111 (H) 10/02/2018   ALT 17 10/02/2018    Lab Results  Component Value Date   CHOL 193 10/02/2018   HDL 58 10/02/2018   LDLCALC 81 10/02/2018   TRIG 270 (H) 10/02/2018     --------------------------------------------------------------------------------------------------  ASSESSMENT AND PLAN: Chest pain: Longstanding and atypical in nature.  KatherineJames reports a negative stress test ~15 years ago.  We discussed repeating a stress test but Katherine James is concerned about side effects of pharmacologic stress agent.  We also discussed exercise tolerance test, though I am concerned about her ability to reach target heart rate given baseline shortness of breath and need to wear a mask during the test in the setting of COVID-19 precautions.  We have therefore agreed to defer testing.  Continue primary prevention of CAD per PCP.  Shortness of breath: Only present when wearing a facemask.  No evidence of HF on exam today.  As above, we discussed stress testing to exclude ischemic heart disease but have agreed to defer this as well as echocardiography.  I do not believe that a mask exemption is indicated, as there are no objective cardiac findings at this point to recommend against standard COVID-19 precautions.  If dyspnea worsens, we will need to readdress echo and stress testing.  Hypertension: Blood pressure well-controlled today.  Continue HCTZ and ongoing follow-up by her PCP.  Follow-up: The patient does not with to schedule routine follow-up.  She can return to see [place] as needed.  Nelva Bush, MD 03/26/2019 7:49  AM

## 2019-03-25 NOTE — Patient Instructions (Signed)
Medication Instructions:  Your physician recommends that you continue on your current medications as directed. Please refer to the Current Medication list given to you today.  *If you need a refill on your cardiac medications before your next appointment, please call your pharmacy*  Lab Work: none If you have labs (blood work) drawn today and your tests are completely normal, you will receive your results only by: Marland Kitchen MyChart Message (if you have MyChart) OR . A paper copy in the mail If you have any lab test that is abnormal or we need to change your treatment, we will call you to review the results.  Testing/Procedures: none  Follow-Up: At [facility], you and your health needs are our priority.  As part of our continuing mission to provide you with exceptional heart care, we have created designated Provider Care Teams.  These Care Teams include your primary Cardiologist (physician) and Advanced Practice Providers (APPs -  Physician Assistants and Nurse Practitioners) who all work together to provide you with the care you need, when you need it.  Your next appointment:   As needed.  The format for your next appointment:   In Person  Provider:    You may see DR Harrell Gave END or one of the following Advanced Practice Providers on your designated Care Team:    Murray Hodgkins, NP  Christell Faith, PA-C  Marrianne Mood, PA-C

## 2019-03-26 ENCOUNTER — Encounter: Payer: Self-pay | Admitting: Internal Medicine

## 2019-03-26 DIAGNOSIS — M545 Low back pain: Secondary | ICD-10-CM | POA: Diagnosis not present

## 2019-03-26 DIAGNOSIS — R262 Difficulty in walking, not elsewhere classified: Secondary | ICD-10-CM | POA: Diagnosis not present

## 2019-03-26 DIAGNOSIS — M25551 Pain in right hip: Secondary | ICD-10-CM | POA: Diagnosis not present

## 2019-03-26 DIAGNOSIS — M25552 Pain in left hip: Secondary | ICD-10-CM | POA: Diagnosis not present

## 2019-03-26 DIAGNOSIS — M797 Fibromyalgia: Secondary | ICD-10-CM | POA: Diagnosis not present

## 2019-03-27 ENCOUNTER — Ambulatory Visit: Payer: PPO

## 2019-03-30 ENCOUNTER — Other Ambulatory Visit: Payer: Self-pay | Admitting: Physician Assistant

## 2019-03-31 ENCOUNTER — Ambulatory Visit
Admission: RE | Admit: 2019-03-31 | Discharge: 2019-03-31 | Disposition: A | Discharge status: Home / Self Care | Payer: PPO | Source: Ambulatory Visit | Attending: Family Medicine | Admitting: Family Medicine

## 2019-03-31 ENCOUNTER — Other Ambulatory Visit: Payer: Self-pay

## 2019-03-31 DIAGNOSIS — E049 Nontoxic goiter, unspecified: Secondary | ICD-10-CM | POA: Diagnosis not present

## 2019-03-31 DIAGNOSIS — E010 Iodine-deficiency related diffuse (endemic) goiter: Secondary | ICD-10-CM | POA: Diagnosis not present

## 2019-04-07 DIAGNOSIS — M797 Fibromyalgia: Secondary | ICD-10-CM | POA: Diagnosis not present

## 2019-04-07 DIAGNOSIS — M25552 Pain in left hip: Secondary | ICD-10-CM | POA: Diagnosis not present

## 2019-04-07 DIAGNOSIS — R262 Difficulty in walking, not elsewhere classified: Secondary | ICD-10-CM | POA: Diagnosis not present

## 2019-04-07 DIAGNOSIS — M545 Low back pain: Secondary | ICD-10-CM | POA: Diagnosis not present

## 2019-04-07 DIAGNOSIS — M25551 Pain in right hip: Secondary | ICD-10-CM | POA: Diagnosis not present

## 2019-04-10 DIAGNOSIS — F4321 Adjustment disorder with depressed mood: Secondary | ICD-10-CM | POA: Diagnosis not present

## 2019-04-10 DIAGNOSIS — F3181 Bipolar II disorder: Secondary | ICD-10-CM | POA: Diagnosis not present

## 2019-04-10 DIAGNOSIS — Z639 Problem related to primary support group, unspecified: Secondary | ICD-10-CM | POA: Diagnosis not present

## 2019-04-13 DIAGNOSIS — F319 Bipolar disorder, unspecified: Secondary | ICD-10-CM | POA: Diagnosis not present

## 2019-04-13 DIAGNOSIS — F1120 Opioid dependence, uncomplicated: Secondary | ICD-10-CM | POA: Diagnosis not present

## 2019-04-13 DIAGNOSIS — G894 Chronic pain syndrome: Secondary | ICD-10-CM | POA: Diagnosis not present

## 2019-04-13 DIAGNOSIS — Z79899 Other long term (current) drug therapy: Secondary | ICD-10-CM | POA: Diagnosis not present

## 2019-04-13 DIAGNOSIS — M2669 Other specified disorders of temporomandibular joint: Secondary | ICD-10-CM | POA: Diagnosis not present

## 2019-04-14 ENCOUNTER — Other Ambulatory Visit: Payer: Self-pay | Admitting: Physician Assistant

## 2019-04-14 DIAGNOSIS — M25551 Pain in right hip: Secondary | ICD-10-CM | POA: Diagnosis not present

## 2019-04-14 DIAGNOSIS — M797 Fibromyalgia: Secondary | ICD-10-CM | POA: Diagnosis not present

## 2019-04-14 DIAGNOSIS — M545 Low back pain: Secondary | ICD-10-CM | POA: Diagnosis not present

## 2019-04-14 DIAGNOSIS — M25552 Pain in left hip: Secondary | ICD-10-CM | POA: Diagnosis not present

## 2019-04-14 DIAGNOSIS — R262 Difficulty in walking, not elsewhere classified: Secondary | ICD-10-CM | POA: Diagnosis not present

## 2019-04-14 NOTE — Telephone Encounter (Signed)
Due back 05/11

## 2019-04-15 ENCOUNTER — Other Ambulatory Visit: Payer: Self-pay | Admitting: Physician Assistant

## 2019-04-16 DIAGNOSIS — M25551 Pain in right hip: Secondary | ICD-10-CM | POA: Diagnosis not present

## 2019-04-16 DIAGNOSIS — M25552 Pain in left hip: Secondary | ICD-10-CM | POA: Diagnosis not present

## 2019-04-16 DIAGNOSIS — R262 Difficulty in walking, not elsewhere classified: Secondary | ICD-10-CM | POA: Diagnosis not present

## 2019-04-16 DIAGNOSIS — M797 Fibromyalgia: Secondary | ICD-10-CM | POA: Diagnosis not present

## 2019-04-16 DIAGNOSIS — M545 Low back pain: Secondary | ICD-10-CM | POA: Diagnosis not present

## 2019-04-24 DIAGNOSIS — Z639 Problem related to primary support group, unspecified: Secondary | ICD-10-CM | POA: Diagnosis not present

## 2019-04-24 DIAGNOSIS — F3181 Bipolar II disorder: Secondary | ICD-10-CM | POA: Diagnosis not present

## 2019-04-24 DIAGNOSIS — F4321 Adjustment disorder with depressed mood: Secondary | ICD-10-CM | POA: Diagnosis not present

## 2019-05-01 NOTE — Telephone Encounter (Signed)
This is for her next refill on 05/13/2019, they are requesting ahead of time.  Last fill 04/15/2019, patient scheduled back in May 2021

## 2019-05-11 DIAGNOSIS — Z7689 Persons encountering health services in other specified circumstances: Secondary | ICD-10-CM | POA: Diagnosis not present

## 2019-05-11 DIAGNOSIS — D509 Iron deficiency anemia, unspecified: Secondary | ICD-10-CM | POA: Diagnosis not present

## 2019-05-11 DIAGNOSIS — R7989 Other specified abnormal findings of blood chemistry: Secondary | ICD-10-CM | POA: Diagnosis not present

## 2019-05-13 DIAGNOSIS — M2669 Other specified disorders of temporomandibular joint: Secondary | ICD-10-CM | POA: Diagnosis not present

## 2019-05-13 DIAGNOSIS — F319 Bipolar disorder, unspecified: Secondary | ICD-10-CM | POA: Diagnosis not present

## 2019-05-13 DIAGNOSIS — Z79899 Other long term (current) drug therapy: Secondary | ICD-10-CM | POA: Diagnosis not present

## 2019-05-13 DIAGNOSIS — G894 Chronic pain syndrome: Secondary | ICD-10-CM | POA: Diagnosis not present

## 2019-05-13 DIAGNOSIS — F1120 Opioid dependence, uncomplicated: Secondary | ICD-10-CM | POA: Diagnosis not present

## 2019-05-22 DIAGNOSIS — F3181 Bipolar II disorder: Secondary | ICD-10-CM | POA: Diagnosis not present

## 2019-05-22 DIAGNOSIS — F4321 Adjustment disorder with depressed mood: Secondary | ICD-10-CM | POA: Diagnosis not present

## 2019-05-22 DIAGNOSIS — Z639 Problem related to primary support group, unspecified: Secondary | ICD-10-CM | POA: Diagnosis not present

## 2019-06-01 DIAGNOSIS — M797 Fibromyalgia: Secondary | ICD-10-CM | POA: Diagnosis not present

## 2019-06-01 DIAGNOSIS — Z79899 Other long term (current) drug therapy: Secondary | ICD-10-CM | POA: Diagnosis not present

## 2019-06-01 DIAGNOSIS — F3181 Bipolar II disorder: Secondary | ICD-10-CM | POA: Diagnosis not present

## 2019-06-01 DIAGNOSIS — I10 Essential (primary) hypertension: Secondary | ICD-10-CM | POA: Diagnosis not present

## 2019-06-01 DIAGNOSIS — G894 Chronic pain syndrome: Secondary | ICD-10-CM | POA: Diagnosis not present

## 2019-06-01 DIAGNOSIS — K469 Unspecified abdominal hernia without obstruction or gangrene: Secondary | ICD-10-CM | POA: Diagnosis not present

## 2019-06-08 DIAGNOSIS — F319 Bipolar disorder, unspecified: Secondary | ICD-10-CM | POA: Diagnosis not present

## 2019-06-08 DIAGNOSIS — M2669 Other specified disorders of temporomandibular joint: Secondary | ICD-10-CM | POA: Diagnosis not present

## 2019-06-08 DIAGNOSIS — G894 Chronic pain syndrome: Secondary | ICD-10-CM | POA: Diagnosis not present

## 2019-06-08 DIAGNOSIS — F1120 Opioid dependence, uncomplicated: Secondary | ICD-10-CM | POA: Diagnosis not present

## 2019-06-09 ENCOUNTER — Ambulatory Visit: Payer: PPO | Admitting: Dermatology

## 2019-06-23 ENCOUNTER — Ambulatory Visit: Payer: PPO | Admitting: Surgery

## 2019-07-01 ENCOUNTER — Other Ambulatory Visit: Payer: Self-pay | Admitting: Physician Assistant

## 2019-07-01 DIAGNOSIS — E039 Hypothyroidism, unspecified: Secondary | ICD-10-CM | POA: Diagnosis not present

## 2019-07-07 ENCOUNTER — Telehealth (INDEPENDENT_AMBULATORY_CARE_PROVIDER_SITE_OTHER): Payer: PPO | Admitting: Physician Assistant

## 2019-07-07 ENCOUNTER — Encounter: Payer: Self-pay | Admitting: Physician Assistant

## 2019-07-07 DIAGNOSIS — F900 Attention-deficit hyperactivity disorder, predominantly inattentive type: Secondary | ICD-10-CM | POA: Diagnosis not present

## 2019-07-07 DIAGNOSIS — F319 Bipolar disorder, unspecified: Secondary | ICD-10-CM

## 2019-07-07 DIAGNOSIS — G4700 Insomnia, unspecified: Secondary | ICD-10-CM | POA: Diagnosis not present

## 2019-07-07 DIAGNOSIS — Z79899 Other long term (current) drug therapy: Secondary | ICD-10-CM

## 2019-07-07 DIAGNOSIS — F4310 Post-traumatic stress disorder, unspecified: Secondary | ICD-10-CM | POA: Diagnosis not present

## 2019-07-07 MED ORDER — LITHIUM CARBONATE 300 MG PO CAPS: 900.0000 mg | ORAL_CAPSULE | Freq: Every day | ORAL | 0 refills | Status: DC

## 2019-07-07 MED ORDER — LITHIUM CARBONATE 150 MG PO CAPS: ORAL_CAPSULE | ORAL | 0 refills | Status: DC

## 2019-07-07 MED ORDER — LAMOTRIGINE 200 MG PO TABS: 300.0000 mg | ORAL_TABLET | Freq: Every day | ORAL | 1 refills | Status: DC

## 2019-07-07 NOTE — Progress Notes (Signed)
[facility]  Patient ID: Katherine James,  MRN: [id number]  PCP: Valerie Roys, DO  Date of Evaluation: 07/07/2019 Time spent:20 minutes  Chief Complaint:  Chief Complaint    Follow-up     Virtual Visit via Telephone/video note  I connected with patient by a video enabled telemedicine application or telephone, with their informed consent, and verified patient privacy and that I am speaking with the correct person using two identifiers.  I am private, in my office and the patient is at her mom's.  I discussed the limitations, risks, security and privacy concerns of performing an evaluation and management service by telephone/video and the availability of in person appointments.  We started the visit with video but a few minutes in, I was having difficulty hearing her so we changed to the phone visit.  I also discussed with the patient that there may be a patient responsible charge related to this service. The patient expressed understanding and agreed to proceed.   I discussed the assessment and treatment plan with the patient. The patient was provided an opportunity to ask questions and all were answered. The patient agreed with the plan and demonstrated an understanding of the instructions.   The patient was advised to call back or seek an in-person evaluation if the symptoms worsen or if the condition fails to improve as anticipated.  I provided 20 minutes of non-face-to-face time during this encounter.  HISTORY/CURRENT STATUS: HPI For routine med check.  Under a lot of stress.  Continues to take care of her 58 yo mom who has dementia. Her siblings aren't helping and actually cause more stress. Is worn out because of the fatigue of caring for her mom day and night.  And is understandably sad due to the whole situation and watching her mom decline is very difficult.  These things also trigger anxiety.  States she is doing the best she can with what is going on right  now.  Adderall is causing blurred vision. She is cutting them into 1/8s and that helps a little w/ the vision and energy/focus.  Has seen eye dr and no acute probs found.  She does not really have time to enjoy anything and if she did, she is too tired to do it.  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  So she is extremely tired.  No SI/HI  Patient denies increased energy with decreased need for sleep, no increased talkativeness, no racing thoughts, no impulsivity or risky behaviors, no increased spending, no increased libido, no grandiosity, no increased irritability or anger, and no hallucinations.  Denies dizziness, syncope, seizures, numbness, tingling, tremor, tics, unsteady gait, slurred speech, confusion. Denies muscle or joint pain, stiffness, or dystonia.  Individual Medical History/ Review of Systems: Changes? :Yes Has a hernia that will need surgery at some point.  Allergies: Alprazolam, Amoxicillin-pot clavulanate, Celecoxib, Chlorpromazine hcl, Codeine, Depakote [divalproex sodium], Desipramine, Diclofenac sodium, Doxycycline, Etodolac, Fluoxetine hcl, Fluticasone propionate, Ibuprofen, Influenza vaccines, Iodine, Ketorolac tromethamine, Levofloxacin, Meperidine hcl, Nabumetone, Naproxen, Neurontin [gabapentin], Olive oil, Oxaprozin, Paroxetine, Phenergan [promethazine hcl], Piroxicam, Pneumococcal vaccines, Rofecoxib, Skelaxin [metaxalone], Sulfamethoxazole-trimethoprim, Sulfasalazine, Sulindac, Symbicort [budesonide-formoterol fumarate], Terfenadine, Tetracycline, Tramadol hcl, Wellbutrin [bupropion], and Zinc  Current Medications:  Current Outpatient Medications:  .  amphetamine-dextroamphetamine (ADDERALL) 20 MG tablet, TAKE 1/2 TABLET BY MOUTH 2 TIMES DAILY, Disp: 30 tablet, Rfl: 0 .  carisoprodol (SOMA) 350 MG tablet, Take 3 tablets daily, Disp: , Rfl:  .  hydrochlorothiazide (HYDRODIURIL) 25 MG tablet, TAKE 1  TABLET BY MOUTH ONCE DAILY., Disp: 30 tablet, Rfl: 1 .   lamoTRIgine (LAMICTAL) 200 MG tablet, Take 1.5 tablets (300 mg total) by mouth daily., Disp: 135 tablet, Rfl: 1 .  levothyroxine (SYNTHROID, LEVOTHROID) 25 MCG tablet, Take 0.5 mcg by mouth daily before breakfast. , Disp: , Rfl:  .  lithium carbonate 150 MG capsule, TAKE 1 CAPSULE BY MOUTH AT BEDTIME (TAKE WITH 900 MG LITHIUM TO EQUAL 1050 MG/DAY), Disp: 90 capsule, Rfl: 0 .  lithium carbonate 300 MG capsule, Take 3 capsules (900 mg total) by mouth at bedtime. Take with 150 mg capsule., Disp: 270 capsule, Rfl: 0 .  ondansetron (ZOFRAN) 4 MG tablet, TAKE 1 AND 1/2 TABLETS BY MOUTH DAILY FOR NAUSEA, Disp: 45 tablet, Rfl: 0 .  oxyCODONE (OXY IR/ROXICODONE) 5 MG immediate release tablet, Take 10 mg daily as needed by mouth for severe pain or breakthrough pain. , Disp: , Rfl:  .  pregabalin (LYRICA) 25 MG capsule, Take 25 mg by mouth 2 (two) times daily., Disp: , Rfl:  .  Pregnenolone Micronized (PREGNENOLONE PO), Take by mouth., Disp: , Rfl:  .  progesterone (PROMETRIUM) 200 MG capsule, Take 200 mg by mouth daily., Disp: , Rfl:  .  Suvorexant (BELSOMRA) 20 MG TABS, Take 20 mg by mouth at bedtime as needed., Disp: 30 tablet, Rfl: 2 .  Tavaborole (KERYDIN) 5 % SOLN, Apply topically., Disp: , Rfl:  .  UNABLE TO FIND, DHEA SR (compound)- take 17 mg (1 tablet) daily except Saturday and Sunday, Disp: , Rfl:  .  UNABLE TO FIND, Hormone Topical Cream (Compound): Bi-Est (8:2) 1 mg, apply 1 gram every morning, Disp: , Rfl:  .  UNABLE TO FIND, Fentenyl Pain Patch, apply one 12 mcg patch every 3 days for chronic pain, Disp: , Rfl:  .  UNABLE TO FIND, Nature's Thryoid- take 3 g M, W and F, and 2 g every other day, Disp: , Rfl:  .  UNABLE TO FIND, Progesterone SR 300 mg (compound), take 1 tablet daily at bedtime, Disp: , Rfl:  .  UNABLE TO FIND, Seroquel (Compound)- 8.3 mg per mL, take 1 mL daily at bedtime, Disp: , Rfl:  .  UNABLE TO FIND, Pregnenolone 90 mg (compound), take 1 tablet daily, Disp: , Rfl:  .  UNABLE  TO FIND, Testosterone Cream 0.5%- 1g/51mL, Disp: , Rfl:  Medication Side Effects: none  Family Medical/ Social History: Changes? No  MENTAL HEALTH EXAM:  There were no vitals taken for this visit.There is no height or weight on file to calculate BMI.  General Appearance: Casual and Well Groomed  Eye Contact:  Good  Speech:  Clear and Coherent and Normal Rate  Volume:  Normal  Mood:  Euthymic  Affect:  Appropriate  Thought Process:  Goal Directed and Descriptions of Associations: Intact  Orientation:  Full (Time, Place, and Person)  Thought Content: Logical   Suicidal Thoughts:  No  Homicidal Thoughts:  No  Memory:  WNL  Judgement:  Good  Insight:  Good  Psychomotor Activity:  Normal  Concentration:  Concentration: Good and Attention Span: Good  Recall:  Good  Fund of Knowledge: Good  Language: Good  Assets:  Desire for Improvement  ADL's:  Intact  Cognition: WNL  Prognosis:  Good    DIAGNOSES:    ICD-10-CM   1. Bipolar I disorder ([place])  F31.9   2. Encounter for long-term (current) use of medications  Z79.899 Lithium level    Comprehensive metabolic panel  3. Attention deficit hyperactivity disorder (ADHD), predominantly inattentive type  F90.0   4. Insomnia, unspecified type  G47.00   5. PTSD (post-traumatic stress disorder)  F43.10     Receiving Psychotherapy: Yes  [facility]   RECOMMENDATIONS:  PDMP was reviewed. I spent 20 minutes with her. We discussed self-care but not sure that there can be any changes made right now. With the Adderall, I offered a lower dose so she could cut that into even smaller pieces if needed.  She prefers to keep it as it is right now and she does not need a prescription. Continue Adderall 20 mg, written for one half p.o. twice daily. Continue Lamictal 200 mg, 1.5 pills daily. Continue lithium 1050 mg/day (3x300+150 mg caps) Continue Belsomra 20 mg, 1 p.o. nightly as needed. Continue Seroquel 8.3 mg daily, she has compounded. Draw  labs as above.  Another provider checks her TSH routinely. Return in 6 months.  Donnal Moat, PA-C

## 2019-07-10 DIAGNOSIS — Z639 Problem related to primary support group, unspecified: Secondary | ICD-10-CM | POA: Diagnosis not present

## 2019-07-10 DIAGNOSIS — F4321 Adjustment disorder with depressed mood: Secondary | ICD-10-CM | POA: Diagnosis not present

## 2019-07-10 DIAGNOSIS — F3181 Bipolar II disorder: Secondary | ICD-10-CM | POA: Diagnosis not present

## 2019-07-13 DIAGNOSIS — G894 Chronic pain syndrome: Secondary | ICD-10-CM | POA: Diagnosis not present

## 2019-07-13 DIAGNOSIS — F319 Bipolar disorder, unspecified: Secondary | ICD-10-CM | POA: Diagnosis not present

## 2019-07-13 DIAGNOSIS — F1120 Opioid dependence, uncomplicated: Secondary | ICD-10-CM | POA: Diagnosis not present

## 2019-07-13 DIAGNOSIS — M2669 Other specified disorders of temporomandibular joint: Secondary | ICD-10-CM | POA: Diagnosis not present

## 2019-07-13 DIAGNOSIS — Z79899 Other long term (current) drug therapy: Secondary | ICD-10-CM | POA: Diagnosis not present

## 2019-08-12 DIAGNOSIS — M2669 Other specified disorders of temporomandibular joint: Secondary | ICD-10-CM | POA: Diagnosis not present

## 2019-08-12 DIAGNOSIS — Z79899 Other long term (current) drug therapy: Secondary | ICD-10-CM | POA: Diagnosis not present

## 2019-08-12 DIAGNOSIS — F1120 Opioid dependence, uncomplicated: Secondary | ICD-10-CM | POA: Diagnosis not present

## 2019-08-12 DIAGNOSIS — G894 Chronic pain syndrome: Secondary | ICD-10-CM | POA: Diagnosis not present

## 2019-08-12 DIAGNOSIS — F319 Bipolar disorder, unspecified: Secondary | ICD-10-CM | POA: Diagnosis not present

## 2019-08-14 DIAGNOSIS — H2513 Age-related nuclear cataract, bilateral: Secondary | ICD-10-CM | POA: Diagnosis not present

## 2019-08-18 DIAGNOSIS — R0789 Other chest pain: Secondary | ICD-10-CM | POA: Diagnosis not present

## 2019-08-18 DIAGNOSIS — Z79899 Other long term (current) drug therapy: Secondary | ICD-10-CM | POA: Diagnosis not present

## 2019-08-18 DIAGNOSIS — R200 Anesthesia of skin: Secondary | ICD-10-CM | POA: Diagnosis not present

## 2019-08-18 DIAGNOSIS — I10 Essential (primary) hypertension: Secondary | ICD-10-CM | POA: Diagnosis not present

## 2019-08-18 DIAGNOSIS — Z885 Allergy status to narcotic agent status: Secondary | ICD-10-CM | POA: Diagnosis not present

## 2019-08-18 DIAGNOSIS — R519 Headache, unspecified: Secondary | ICD-10-CM | POA: Diagnosis not present

## 2019-08-18 DIAGNOSIS — F1590 Other stimulant use, unspecified, uncomplicated: Secondary | ICD-10-CM | POA: Diagnosis not present

## 2019-08-18 DIAGNOSIS — H538 Other visual disturbances: Secondary | ICD-10-CM | POA: Diagnosis not present

## 2019-08-18 DIAGNOSIS — Z823 Family history of stroke: Secondary | ICD-10-CM | POA: Diagnosis not present

## 2019-08-18 DIAGNOSIS — Z886 Allergy status to analgesic agent status: Secondary | ICD-10-CM | POA: Diagnosis not present

## 2019-08-18 DIAGNOSIS — Z887 Allergy status to serum and vaccine status: Secondary | ICD-10-CM | POA: Diagnosis not present

## 2019-08-18 DIAGNOSIS — R42 Dizziness and giddiness: Secondary | ICD-10-CM | POA: Diagnosis not present

## 2019-08-18 DIAGNOSIS — H5509 Other forms of nystagmus: Secondary | ICD-10-CM | POA: Diagnosis not present

## 2019-08-18 DIAGNOSIS — M797 Fibromyalgia: Secondary | ICD-10-CM | POA: Diagnosis not present

## 2019-08-18 DIAGNOSIS — F319 Bipolar disorder, unspecified: Secondary | ICD-10-CM | POA: Diagnosis not present

## 2019-08-18 DIAGNOSIS — M1990 Unspecified osteoarthritis, unspecified site: Secondary | ICD-10-CM | POA: Diagnosis not present

## 2019-08-18 DIAGNOSIS — E039 Hypothyroidism, unspecified: Secondary | ICD-10-CM | POA: Diagnosis not present

## 2019-08-18 DIAGNOSIS — Z9109 Other allergy status, other than to drugs and biological substances: Secondary | ICD-10-CM | POA: Diagnosis not present

## 2019-08-18 DIAGNOSIS — F419 Anxiety disorder, unspecified: Secondary | ICD-10-CM | POA: Diagnosis not present

## 2019-08-18 DIAGNOSIS — Z7982 Long term (current) use of aspirin: Secondary | ICD-10-CM | POA: Diagnosis not present

## 2019-08-20 DIAGNOSIS — M25512 Pain in left shoulder: Secondary | ICD-10-CM | POA: Diagnosis not present

## 2019-08-24 ENCOUNTER — Telehealth: Payer: Self-pay | Admitting: Physician Assistant

## 2019-08-24 ENCOUNTER — Other Ambulatory Visit: Payer: Self-pay | Admitting: Physician Assistant

## 2019-08-24 DIAGNOSIS — L309 Dermatitis, unspecified: Secondary | ICD-10-CM | POA: Diagnosis not present

## 2019-08-24 DIAGNOSIS — I10 Essential (primary) hypertension: Secondary | ICD-10-CM | POA: Diagnosis not present

## 2019-08-24 DIAGNOSIS — R600 Localized edema: Secondary | ICD-10-CM | POA: Diagnosis not present

## 2019-08-24 DIAGNOSIS — F419 Anxiety disorder, unspecified: Secondary | ICD-10-CM | POA: Diagnosis not present

## 2019-08-24 DIAGNOSIS — Z636 Dependent relative needing care at home: Secondary | ICD-10-CM | POA: Diagnosis not present

## 2019-08-24 MED ORDER — LORAZEPAM 0.5 MG PO TABS: 0.2500 mg | ORAL_TABLET | Freq: Two times a day (BID) | ORAL | 0 refills | Status: DC | PRN

## 2019-08-24 NOTE — Telephone Encounter (Signed)
Patient called and said that her mom had a stroke and is in rehab. Her blood pressure has been 165/109. Her PCP is going to put her on lisonapril. However her anxiety is through the roof and she needs something for it. PCP will not prescribe anything for the anxiety. Please give her a call at [phone]

## 2019-08-24 NOTE — Telephone Encounter (Signed)
Prescription for Ativan sent in, requesting pharmacy to double check allergies.

## 2019-08-24 NOTE — Telephone Encounter (Signed)
Please let her know I am sorry to hear about her mom.  I will send in small amount of Xanax. she needs to hold the Adderall if at all possible b/c they cancel each other out. Need pharmacy please.

## 2019-08-25 DIAGNOSIS — K432 Incisional hernia without obstruction or gangrene: Secondary | ICD-10-CM | POA: Diagnosis not present

## 2019-08-25 NOTE — Telephone Encounter (Signed)
LM with information and to call back with any questions or concerns.

## 2019-09-01 ENCOUNTER — Ambulatory Visit: Payer: PPO | Admitting: Dermatology

## 2019-09-06 DIAGNOSIS — I10 Essential (primary) hypertension: Secondary | ICD-10-CM | POA: Diagnosis not present

## 2019-09-06 DIAGNOSIS — R251 Tremor, unspecified: Secondary | ICD-10-CM | POA: Diagnosis not present

## 2019-09-09 DIAGNOSIS — M2669 Other specified disorders of temporomandibular joint: Secondary | ICD-10-CM | POA: Diagnosis not present

## 2019-09-09 DIAGNOSIS — G894 Chronic pain syndrome: Secondary | ICD-10-CM | POA: Diagnosis not present

## 2019-09-09 DIAGNOSIS — F1120 Opioid dependence, uncomplicated: Secondary | ICD-10-CM | POA: Diagnosis not present

## 2019-09-09 DIAGNOSIS — F319 Bipolar disorder, unspecified: Secondary | ICD-10-CM | POA: Diagnosis not present

## 2019-09-15 ENCOUNTER — Other Ambulatory Visit: Payer: Self-pay | Admitting: Physician Assistant

## 2019-09-16 NOTE — Telephone Encounter (Signed)
See's due back 12/2019

## 2019-10-01 ENCOUNTER — Telehealth: Payer: Self-pay | Admitting: Physician Assistant

## 2019-10-01 NOTE — Telephone Encounter (Signed)
Left patient message that she already has lab orders from 06/2019 on file at [facility] for her to get lithium level drawn. Advised her to go get those done asap and to call back with symptoms.

## 2019-10-01 NOTE — Telephone Encounter (Signed)
Katherine James called to see if she needs to get a test for Lithium levels. Patient's friends think she is unstable. Next appt is 01/07/20

## 2019-10-01 NOTE — Telephone Encounter (Signed)
Noted  

## 2019-10-02 DIAGNOSIS — Z79899 Other long term (current) drug therapy: Secondary | ICD-10-CM | POA: Diagnosis not present

## 2019-10-02 NOTE — Telephone Encounter (Signed)
Left another detailed message.

## 2019-10-03 LAB — LITHIUM LEVEL: Lithium Lvl: 1.2 mmol/L (ref 0.5–1.2)

## 2019-10-03 LAB — COMPREHENSIVE METABOLIC PANEL
ALT: 24 IU/L (ref 0–32)
AST: 17 IU/L (ref 0–40)
Albumin/Globulin Ratio: 2.4 — ABNORMAL HIGH (ref 1.2–2.2)
Albumin: 5 g/dL — ABNORMAL HIGH (ref 3.8–4.9)
Alkaline Phosphatase: 129 IU/L — ABNORMAL HIGH (ref 48–121)
BUN/Creatinine Ratio: 15 (ref 9–23)
BUN: 14 mg/dL (ref 6–24)
Bilirubin Total: 0.3 mg/dL (ref 0.0–1.2)
CO2: 24 mmol/L (ref 20–29)
Calcium: 9.9 mg/dL (ref 8.7–10.2)
Chloride: 102 mmol/L (ref 96–106)
Creatinine, Ser: 0.96 mg/dL (ref 0.57–1.00)
GFR calc Af Amer: 75 mL/min/{1.73_m2} (ref >59)
GFR calc non Af Amer: 65 mL/min/{1.73_m2} (ref >59)
Globulin, Total: 2.1 g/dL (ref 1.5–4.5)
Glucose: 116 mg/dL — ABNORMAL HIGH (ref 65–99)
Potassium: 4.2 mmol/L (ref 3.5–5.2)
Sodium: 140 mmol/L (ref 134–144)
Total Protein: 7.1 g/dL (ref 6.0–8.5)

## 2019-10-05 NOTE — Progress Notes (Signed)
Lithium level was 1.2 which is fine, BUN and creatinine were normal.  No changes.

## 2019-10-12 DIAGNOSIS — Z79891 Long term (current) use of opiate analgesic: Secondary | ICD-10-CM | POA: Diagnosis not present

## 2019-10-12 DIAGNOSIS — G894 Chronic pain syndrome: Secondary | ICD-10-CM | POA: Diagnosis not present

## 2019-10-12 DIAGNOSIS — Z79899 Other long term (current) drug therapy: Secondary | ICD-10-CM | POA: Diagnosis not present

## 2019-10-12 DIAGNOSIS — I10 Essential (primary) hypertension: Secondary | ICD-10-CM | POA: Diagnosis not present

## 2019-10-12 DIAGNOSIS — M2669 Other specified disorders of temporomandibular joint: Secondary | ICD-10-CM | POA: Diagnosis not present

## 2019-10-12 DIAGNOSIS — R531 Weakness: Secondary | ICD-10-CM | POA: Diagnosis not present

## 2019-10-12 DIAGNOSIS — F1120 Opioid dependence, uncomplicated: Secondary | ICD-10-CM | POA: Diagnosis not present

## 2019-10-27 ENCOUNTER — Telehealth: Payer: Self-pay | Admitting: Pulmonary Disease

## 2019-10-27 DIAGNOSIS — G4733 Obstructive sleep apnea (adult) (pediatric): Secondary | ICD-10-CM

## 2019-10-27 NOTE — Telephone Encounter (Signed)
Patient has not been seen since 07/2016 and per insurance guidelines is no longer an active patient in our practice (3 year window).  Patient is required by insurance to be seen in office prior to [place] being able to order any equipment for her.  lmtcb X1 for pt.

## 2019-10-28 NOTE — Telephone Encounter (Signed)
ATC patient to let her know that she needs an appointment scheduled with our office since we have seen her since 2018. LMTCB if she calls back she will need an appointment with a sleep doctor to establish care

## 2019-10-30 NOTE — Telephone Encounter (Signed)
lmtcb for pt. Patient is now considered a new patient since she has not been seen in over 3 years.  She must be seen in clinic.  We cannot offer a televisit for a consult.

## 2019-10-30 NOTE — Telephone Encounter (Signed)
Patient would like a Televisit appt. Would like order for CPAP mask. Patient phone number is [phone] can be reached after 12:00 pm.

## 2019-11-03 NOTE — Telephone Encounter (Signed)
Called and spoke to pt. Pt is requesting an order for a CPAP mask to go to [email or web address]. Pt has not been seen since 07/2016 by Dr. Halford Chessman. However, pt is requesting afternoon appt, at the end of October or after and for [facility] office, there is nothing available at this time. Will need to keep message open until schedule opens. Pt is requesting an order now to wear her CPAP and then schedule the appt when available. Fax: [phone].   Dr. Halford Chessman please advise if you are ok for a CPAP mask, pt last seen in 07/2016 and is considered a new pt and will need a consult appt. Thanks.

## 2019-11-04 ENCOUNTER — Other Ambulatory Visit: Payer: Self-pay | Admitting: Physician Assistant

## 2019-11-04 NOTE — Telephone Encounter (Signed)
Called and left message on pt vm to call back to the [place] office to schedule an appt - pr

## 2019-11-04 NOTE — Telephone Encounter (Signed)
Okay to send order for new CPAP mask. 

## 2019-11-04 NOTE — Telephone Encounter (Signed)
Order has been placed for pt to receive a new mask. Attempted to call pt to let her know this had been done but unable to reach. Left pt a detailed message letting her know the order has been placed.  Routing to front desk pool so they can help [place] get pt scheduled for an appt at the [place] office

## 2019-11-04 NOTE — Telephone Encounter (Signed)
Next apt 11/11

## 2019-11-05 NOTE — Telephone Encounter (Signed)
Patient only wanted to see NP per Tiburcio Bash- she scheduled pt with Tammy Parrett in our [place] office on 12/14/2019 -pr

## 2019-11-09 DIAGNOSIS — Z87820 Personal history of traumatic brain injury: Secondary | ICD-10-CM | POA: Diagnosis not present

## 2019-11-09 DIAGNOSIS — H5319 Other subjective visual disturbances: Secondary | ICD-10-CM | POA: Diagnosis not present

## 2019-11-09 DIAGNOSIS — R2681 Unsteadiness on feet: Secondary | ICD-10-CM | POA: Diagnosis not present

## 2019-11-09 DIAGNOSIS — R413 Other amnesia: Secondary | ICD-10-CM | POA: Diagnosis not present

## 2019-11-11 DIAGNOSIS — M2669 Other specified disorders of temporomandibular joint: Secondary | ICD-10-CM | POA: Diagnosis not present

## 2019-11-11 DIAGNOSIS — G894 Chronic pain syndrome: Secondary | ICD-10-CM | POA: Diagnosis not present

## 2019-11-11 DIAGNOSIS — Z79899 Other long term (current) drug therapy: Secondary | ICD-10-CM | POA: Diagnosis not present

## 2019-11-11 DIAGNOSIS — F1120 Opioid dependence, uncomplicated: Secondary | ICD-10-CM | POA: Diagnosis not present

## 2019-11-24 ENCOUNTER — Other Ambulatory Visit: Payer: Self-pay | Admitting: Physician Assistant

## 2019-11-25 NOTE — Telephone Encounter (Signed)
Is she still prescribed this?

## 2019-11-25 NOTE — Telephone Encounter (Signed)
Katherine James called to check on status of Adderall refill.  She is wanting to take this medication again due to the fatigue she is experiencing especially since her mother passed.  Has appt 11/11.  Please send in refill.

## 2019-11-26 DIAGNOSIS — F1120 Opioid dependence, uncomplicated: Secondary | ICD-10-CM | POA: Diagnosis not present

## 2019-11-26 DIAGNOSIS — Z79891 Long term (current) use of opiate analgesic: Secondary | ICD-10-CM | POA: Diagnosis not present

## 2019-12-09 DIAGNOSIS — G894 Chronic pain syndrome: Secondary | ICD-10-CM | POA: Diagnosis not present

## 2019-12-09 DIAGNOSIS — F1120 Opioid dependence, uncomplicated: Secondary | ICD-10-CM | POA: Diagnosis not present

## 2019-12-09 DIAGNOSIS — M2669 Other specified disorders of temporomandibular joint: Secondary | ICD-10-CM | POA: Diagnosis not present

## 2019-12-14 ENCOUNTER — Ambulatory Visit: Payer: PPO | Admitting: Adult Health

## 2019-12-18 DIAGNOSIS — F3189 Other bipolar disorder: Secondary | ICD-10-CM | POA: Diagnosis not present

## 2019-12-21 DIAGNOSIS — M2041 Other hammer toe(s) (acquired), right foot: Secondary | ICD-10-CM | POA: Diagnosis not present

## 2019-12-21 DIAGNOSIS — B351 Tinea unguium: Secondary | ICD-10-CM | POA: Diagnosis not present

## 2019-12-21 DIAGNOSIS — M25571 Pain in right ankle and joints of right foot: Secondary | ICD-10-CM | POA: Diagnosis not present

## 2019-12-21 DIAGNOSIS — M25572 Pain in left ankle and joints of left foot: Secondary | ICD-10-CM | POA: Diagnosis not present

## 2019-12-21 DIAGNOSIS — M2042 Other hammer toe(s) (acquired), left foot: Secondary | ICD-10-CM | POA: Diagnosis not present

## 2019-12-21 DIAGNOSIS — L03031 Cellulitis of right toe: Secondary | ICD-10-CM | POA: Diagnosis not present

## 2019-12-21 DIAGNOSIS — L600 Ingrowing nail: Secondary | ICD-10-CM | POA: Diagnosis not present

## 2019-12-23 ENCOUNTER — Other Ambulatory Visit: Payer: Self-pay | Admitting: Physician Assistant

## 2019-12-24 ENCOUNTER — Other Ambulatory Visit: Payer: Self-pay | Admitting: Physician Assistant

## 2020-01-05 DIAGNOSIS — M7542 Impingement syndrome of left shoulder: Secondary | ICD-10-CM | POA: Diagnosis not present

## 2020-01-06 DIAGNOSIS — G894 Chronic pain syndrome: Secondary | ICD-10-CM | POA: Diagnosis not present

## 2020-01-06 DIAGNOSIS — M2669 Other specified disorders of temporomandibular joint: Secondary | ICD-10-CM | POA: Diagnosis not present

## 2020-01-06 DIAGNOSIS — M7542 Impingement syndrome of left shoulder: Secondary | ICD-10-CM | POA: Insufficient documentation

## 2020-01-06 DIAGNOSIS — F1120 Opioid dependence, uncomplicated: Secondary | ICD-10-CM | POA: Diagnosis not present

## 2020-01-07 ENCOUNTER — Other Ambulatory Visit: Payer: Self-pay

## 2020-01-07 ENCOUNTER — Encounter: Payer: Self-pay | Admitting: Physician Assistant

## 2020-01-07 ENCOUNTER — Ambulatory Visit (INDEPENDENT_AMBULATORY_CARE_PROVIDER_SITE_OTHER): Payer: PPO | Admitting: Physician Assistant

## 2020-01-07 DIAGNOSIS — Z79899 Other long term (current) drug therapy: Secondary | ICD-10-CM

## 2020-01-07 DIAGNOSIS — F900 Attention-deficit hyperactivity disorder, predominantly inattentive type: Secondary | ICD-10-CM | POA: Diagnosis not present

## 2020-01-07 DIAGNOSIS — F4310 Post-traumatic stress disorder, unspecified: Secondary | ICD-10-CM | POA: Diagnosis not present

## 2020-01-07 DIAGNOSIS — G4700 Insomnia, unspecified: Secondary | ICD-10-CM

## 2020-01-07 DIAGNOSIS — F4321 Adjustment disorder with depressed mood: Secondary | ICD-10-CM

## 2020-01-07 DIAGNOSIS — Z1231 Encounter for screening mammogram for malignant neoplasm of breast: Secondary | ICD-10-CM | POA: Diagnosis not present

## 2020-01-07 DIAGNOSIS — Z01419 Encounter for gynecological examination (general) (routine) without abnormal findings: Secondary | ICD-10-CM | POA: Diagnosis not present

## 2020-01-07 DIAGNOSIS — F319 Bipolar disorder, unspecified: Secondary | ICD-10-CM

## 2020-01-07 NOTE — Progress Notes (Signed)
[facility]  Patient ID: Mushka Laconte,  MRN: [id number]  PCP: Valerie Roys, DO  Date of Evaluation: 01/07/2020 Time spent:30 minutes  Chief Complaint:  Chief Complaint    Anxiety; ADD; Depression      HISTORY/CURRENT STATUS: HPI For routine med check.  Her Mom died in the summer. She's doing okay.for the most part.  She and her siblings have reconciled things seem more back to normal now since her mom passed away.  She is understandably grieving but the grief comes in waves so she is not sad all the time.  Continues to have chronic pain, decreased energy and motivation due to symptoms of depression as well as fibromyalgia.  A major problem that she has right now is eyes jerk back and forth and have been doing that for a year or more.  She thought the Adderall was the culprit so has stopped the medication altogether hoping that would take care of the problem.  She is not able to drive because of this.   Able to enjoy things.  Energy and motivation are about the same.  She is not isolating.  She does not cry easily unless she starts thinking about her mom.  Personal hygiene is normal.  No SI/HI  Patient denies increased energy with decreased need for sleep, no increased talkativeness, no racing thoughts, no impulsivity or risky behaviors, no increased spending, no increased libido, no grandiosity, no increased irritability or anger, no paranoia, and no hallucinations.  Denies dizziness, syncope, seizures, numbness, tingling, tremor, tics, unsteady gait, slurred speech, confusion. Denies muscle or joint pain, stiffness, or dystonia.  Individual Medical History/ Review of Systems: Changes? :Yes See HPI.  Allergies: Alprazolam, Amoxicillin-pot clavulanate, Celecoxib, Chlorpromazine hcl, Codeine, Depakote [divalproex sodium], Desipramine, Diclofenac sodium, Doxycycline, Etodolac, Fluoxetine hcl, Fluticasone propionate, Ibuprofen, Influenza vaccines, Iodine, Ketorolac  tromethamine, Levofloxacin, Meperidine hcl, Nabumetone, Naproxen, Neurontin [gabapentin], Olive oil, Oxaprozin, Paroxetine, Phenergan [promethazine hcl], Piroxicam, Pneumococcal vaccines, Rofecoxib, Skelaxin [metaxalone], Sulfamethoxazole-trimethoprim, Sulfasalazine, Sulindac, Symbicort [budesonide-formoterol fumarate], Terfenadine, Tetracycline, Tramadol hcl, Wellbutrin [bupropion], and Zinc  Current Medications:  Current Outpatient Medications:  .  carisoprodol (SOMA) 350 MG tablet, Take 3 tablets daily, Disp: , Rfl:  .  hydrochlorothiazide (HYDRODIURIL) 25 MG tablet, TAKE 1 TABLET BY MOUTH ONCE DAILY., Disp: 30 tablet, Rfl: 1 .  lamoTRIgine (LAMICTAL) 200 MG tablet, Take 1.5 tablets (300 mg total) by mouth daily., Disp: 135 tablet, Rfl: 1 .  levothyroxine (SYNTHROID, LEVOTHROID) 25 MCG tablet, Take 0.5 mcg by mouth daily before breakfast. , Disp: , Rfl:  .  lithium carbonate 150 MG capsule, TAKE 1 CAPSULE BY MOUTH AT BEDTIME (TAKEWITH 900MG  LITHIUM TO EQUAL 1050MG /DAY), Disp: 90 capsule, Rfl: 0 .  lithium carbonate 300 MG capsule, TAKE 3 CAPSULES BY MOUTH AT BEDTIME. TAKE WITH 150MG  CAPSULE, Disp: 270 capsule, Rfl: 0 .  LORazepam (ATIVAN) 0.5 MG tablet, TAKE 1/2-1 TABLET BY MOUTH TWICE A DAY AS NEEDED FOR ANXIETY, Disp: 30 tablet, Rfl: 0 .  ondansetron (ZOFRAN) 4 MG tablet, TAKE 1 AND 1/2 TABLETS BY MOUTH DAILY FOR NAUSEA, Disp: 45 tablet, Rfl: 0 .  oxyCODONE (OXY IR/ROXICODONE) 5 MG immediate release tablet, Take 10 mg daily as needed by mouth for severe pain or breakthrough pain. , Disp: , Rfl:  .  Pregnenolone Micronized (PREGNENOLONE PO), Take by mouth., Disp: , Rfl:  .  progesterone (PROMETRIUM) 200 MG capsule, Take 200 mg by mouth daily., Disp: , Rfl:  .  Suvorexant (BELSOMRA) 20 MG TABS, Take 20 mg by mouth at  bedtime as needed., Disp: 30 tablet, Rfl: 2 .  Tavaborole (KERYDIN) 5 % SOLN, Apply topically., Disp: , Rfl:  .  UNABLE TO FIND, DHEA SR (compound)- take 17 mg (1 tablet) daily  except Saturday and Sunday, Disp: , Rfl:  .  UNABLE TO FIND, Hormone Topical Cream (Compound): Bi-Est (8:2) 1 mg, apply 1 gram every morning, Disp: , Rfl:  .  UNABLE TO FIND, Fentenyl Pain Patch, apply one 12 mcg patch every 3 days for chronic pain, Disp: , Rfl:  .  UNABLE TO FIND, Nature's Thryoid- take 3 g M, W and F, and 2 g every other day, Disp: , Rfl:  .  UNABLE TO FIND, Progesterone SR 300 mg (compound), take 1 tablet daily at bedtime, Disp: , Rfl:  .  UNABLE TO FIND, Seroquel (Compound)- 8.3 mg per mL, take 1 mL daily at bedtime, Disp: , Rfl:  .  UNABLE TO FIND, Pregnenolone 90 mg (compound), take 1 tablet daily, Disp: , Rfl:  .  UNABLE TO FIND, Testosterone Cream 0.5%- 1g/29mL, Disp: , Rfl:  .  pregabalin (LYRICA) 25 MG capsule, Take 25 mg by mouth 2 (two) times daily. (Patient not taking: Reported on 01/07/2020), Disp: , Rfl:  Medication Side Effects: none  Family Medical/ Social History: Changes? No  MENTAL HEALTH EXAM:  There were no vitals taken for this visit.There is no height or weight on file to calculate BMI.  General Appearance: Casual and Well Groomed  Eye Contact:  Good  Speech:  Clear and Coherent and Normal Rate  Volume:  Normal  Mood:  Euthymic  Affect:  Appropriate  Thought Process:  Goal Directed and Descriptions of Associations: Intact  Orientation:  Full (Time, Place, and Person)  Thought Content: Logical   Suicidal Thoughts:  No  Homicidal Thoughts:  No  Memory:  WNL  Judgement:  Good  Insight:  Good  Psychomotor Activity:  Normal no nystagmus  Concentration:  Concentration: Good and Attention Span: Good  Recall:  Good  Fund of Knowledge: Good  Language: Good  Assets:  Desire for Improvement  ADL's:  Intact  Cognition: WNL  Prognosis:  Good   Most recent pertinent labs 10/02/2019 lithium level was 1.2, glucose was 116, BUN 14, creatinine 0.96  DIAGNOSES:    ICD-10-CM   1. Bipolar I disorder ([place])  F31.9   2. Encounter for long-term (current) use  of medications  Z79.899 Lithium level  3. PTSD (post-traumatic stress disorder)  F43.10   4. Attention deficit hyperactivity disorder (ADHD), predominantly inattentive type  F90.0   5. Insomnia, unspecified type  G47.00   6. Grief  F43.21     Receiving Psychotherapy: Yes  [facility]   RECOMMENDATIONS:  PDMP was reviewed. I provided 30 minutes of face-to-face time during this encounter. I am glad she will be seeing her PCP, ophthalmologist, or neurologist soon.  I do not think the Adderall is causing the "jerking motion" of her eyes.  If that were the case, when she got off of Adderall, the symptoms would have stopped. Discontinue Adderall (she has already done so.)  We can restart it in the future if needed.  I think it is more important to treat the anxiety right now, if she needs the Ativan. Continue Lamictal 200 mg, 1.5 pills daily. Continue Ativan 0.5 mg, 1/2-1 p.o. twice daily as needed. Continue lithium 1050 mg/day (3x300+150 mg caps) Continue Belsomra 20 mg, 1 p.o. nightly as needed. Continue Seroquel 8.3 mg daily, she has compounded. Lithium  level ordered.   Return in 2 months.  Donnal Moat, PA-C

## 2020-01-25 ENCOUNTER — Ambulatory Visit (INDEPENDENT_AMBULATORY_CARE_PROVIDER_SITE_OTHER): Payer: PPO | Admitting: Pulmonary Disease

## 2020-01-25 ENCOUNTER — Encounter: Payer: Self-pay | Admitting: Pulmonary Disease

## 2020-01-25 ENCOUNTER — Other Ambulatory Visit: Payer: Self-pay

## 2020-01-25 VITALS — BP 140/76 | HR 61 | Temp 98.5°F | Ht 68.0 in | Wt 196.0 lb

## 2020-01-25 DIAGNOSIS — R5381 Other malaise: Secondary | ICD-10-CM | POA: Diagnosis not present

## 2020-01-25 DIAGNOSIS — E782 Mixed hyperlipidemia: Secondary | ICD-10-CM | POA: Diagnosis not present

## 2020-01-25 DIAGNOSIS — R7309 Other abnormal glucose: Secondary | ICD-10-CM | POA: Diagnosis not present

## 2020-01-25 DIAGNOSIS — G4733 Obstructive sleep apnea (adult) (pediatric): Secondary | ICD-10-CM | POA: Diagnosis not present

## 2020-01-25 DIAGNOSIS — N959 Unspecified menopausal and perimenopausal disorder: Secondary | ICD-10-CM | POA: Diagnosis not present

## 2020-01-25 DIAGNOSIS — Z79899 Other long term (current) drug therapy: Secondary | ICD-10-CM | POA: Diagnosis not present

## 2020-01-25 DIAGNOSIS — E7212 Methylenetetrahydrofolate reductase deficiency: Secondary | ICD-10-CM | POA: Diagnosis not present

## 2020-01-25 DIAGNOSIS — E063 Autoimmune thyroiditis: Secondary | ICD-10-CM | POA: Diagnosis not present

## 2020-01-25 NOTE — Progress Notes (Signed)
[facility]  Chief Complaint  Patient presents with  . Consult    getting warning sign on cpap machine    Constitutional:  BP 140/76 (BP Location: Left Arm, Cuff Size: Normal)   Pulse 61   Temp 98.5 F (36.9 C) (Other (Comment)) Comment (Src): wrist  Ht 5\' 8"  (1.727 m)   Wt 196 lb (88.9 kg)   SpO2 97% Comment: Room air  BMI 29.80 kg/m   Past Medical History:  Fatty liver, Fibromyalgia, TMJ, Allergies, Bipolar, Hypothyroidism  Past Surgical History:  Her  has a past surgical history that includes Ligament repair (1982); Tonsillectomy (1978); Plantar fascia release (1994, 1995); Tubal ligation (1996); Abdominal hysterectomy (1998); bilateral salpingoopherectomy (2003); Hemorrhoidectomy with hemorrhoid banding (1996); bilateral breast reduction (1999); Liposuction (1999); Reduction mammaplasty (Bilateral, 1999); and Colonoscopy with propofol (N/A, 07/10/2017).  Brief Summary:  Katherine James is a 59 y.o. female with obstructive sleep apnea.      Subjective:   I last saw her in 2018.  She didn't like dealing with DME's and has been purchasing equipment on her own.  Her previous prescription expired and she was advised that she needed an office visit.  She has several CPAP machine.  She adjusts pressure setting on her own.  Her mask fits well.  Physical Exam:   Deferred.  Sleep Tests:   PSG 06/16/99 >> 56  CPAP 10/27/19 to 01/24/20 >> used on 90 of 90 nights with average 10 hrs 54 min.  Average AHI 2.8 with CPAP 10 cm H2O  Social History:  She  reports that she has never smoked. She has never used smokeless tobacco. She reports previous alcohol use. She reports that she does not use drugs.  Family History:  Her family history includes Allergies in her sister; Asthma in an other family member; Congestive Heart Failure in her maternal grandmother; Heart disease (age of onset: 36) in her father; Obesity in her brother; Parkinson's  disease in her father; Skin cancer in her father and mother; Stroke in her maternal grandfather and mother.     Assessment/Plan:   Obstructive sleep apnea. - she is compliant with therapy and reports benefit from CPAP - she purchases supplies on her own - continue CPAP 10 cm H2O - advised her to keep f/u once per year in order to maintain current scripts for her supplies; should be okay for her to do video visit for follow up  Time Spent Involved in Patient Care on Day of Examination:  15 minutes  Follow up:  Patient Instructions  Follow up in 1 year   Medication List:   Allergies as of 01/25/2020      Reactions   Alprazolam    REACTION: hallucinations   Amoxicillin-pot Clavulanate    REACTION: abd cramping, nausea, vomit   Celecoxib    REACTION: nausea, abd cramping   Chlorpromazine Hcl    REACTION: headaches, spaciness   Codeine    REACTION: headache   Depakote [divalproex Sodium] Nausea Only   cramping   Desipramine    Pain: feels like"going to loose my mind"   Diclofenac Sodium    REACTION: nausea, vomit   Doxycycline    REACTION: abd cramping, nausea, vomit   Etodolac    REACTION: abd cramping, nausea, vomit   Fluoxetine Hcl    REACTION: achiness, abd cramping   Fluticasone Propionate    REACTION: nose bleeds   Ibuprofen    REACTION: abd cramping, nausea, vomit  Influenza Vaccines    pt reports swelling, knotting, discoloration, fever 102 and pain with last vaccine   Iodine    Makes skin crawl; heart races   Ketorolac Tromethamine    REACTION: abd cramping, nausea, vomit   Levofloxacin    REACTION: diarrhea, nausea,   Meperidine Hcl    REACTION: headache   Nabumetone    REACTION: nausea, diarrhea   Naproxen    REACTION: abd cramping, nausea, vomit   Neurontin [gabapentin]    Pain; feels like "loosing my mind"   Nsaids Other (See Comments)   Upset stomach, stomach pain   Olive Oil    Oxaprozin    REACTION: abd cramping, nausea   Paroxetine      REACTION: racing heart, spaciness   Phenergan [promethazine Hcl]    Heaviness in chest   Piroxicam    REACTION: nausea   Pneumococcal Vaccines    pt reports swelling, knotting, discoloration, fever 102 and pain with last vaccine   Rofecoxib    REACTION: nausea, vomit, abd cramping   Skelaxin [metaxalone]    Heaviness in chest   Sulfamethoxazole-trimethoprim    REACTION: abd cramping, nausea   Sulfasalazine    Sulindac    REACTION: abd cramping, nausea, vomit   Symbicort [budesonide-formoterol Fumarate] Other (See Comments)   Dizziness, nausea, and headache    Terfenadine    REACTION: spaciness   Tetracycline    REACTION: abdominal cramping, nausea   Tramadol Hcl    REACTION: abd cramping, nausea, diarrhea   Wellbutrin [bupropion]    Heart races   Zinc    Lisinopril Anxiety, Nausea Only   Other reaction(s): Confusion, Headache, Other (See Comments)      Medication List       Accurate as of January 25, 2020  3:44 PM. If you have any questions, ask your nurse or doctor.        STOP taking these medications   Kerydin 5 % Soln Generic drug: Tavaborole Stopped by: Chesley Mires, MD     TAKE these medications   Belsomra 20 MG Tabs Generic drug: Suvorexant Take 20 mg by mouth at bedtime as needed.   carisoprodol 350 MG tablet Commonly known as: SOMA Take 3 tablets daily   hydrochlorothiazide 25 MG tablet Commonly known as: HYDRODIURIL TAKE 1 TABLET BY MOUTH ONCE DAILY.   lamoTRIgine 200 MG tablet Commonly known as: LAMICTAL Take 1.5 tablets (300 mg total) by mouth daily.   levothyroxine 50 MCG tablet Commonly known as: SYNTHROID Take 50 mcg by mouth daily before breakfast. What changed: Another medication with the same name was removed. Continue taking this medication, and follow the directions you see here. Changed by: Chesley Mires, MD   lithium carbonate 300 MG capsule TAKE 3 CAPSULES BY MOUTH AT BEDTIME. TAKE WITH 150MG  CAPSULE   lithium carbonate 150  MG capsule TAKE 1 CAPSULE BY MOUTH AT BEDTIME (TAKEWITH 900MG  LITHIUM TO EQUAL 1050MG /DAY)   LORazepam 0.5 MG tablet Commonly known as: ATIVAN TAKE 1/2-1 TABLET BY MOUTH TWICE A DAY AS NEEDED FOR ANXIETY   ondansetron 4 MG tablet Commonly known as: ZOFRAN TAKE 1 AND 1/2 TABLETS BY MOUTH DAILY FOR NAUSEA   oxyCODONE 5 MG immediate release tablet Commonly known as: Oxy IR/ROXICODONE Take 10 mg daily as needed by mouth for severe pain or breakthrough pain.   pregabalin 25 MG capsule Commonly known as: LYRICA Take 25 mg by mouth 2 (two) times daily.   PREGNENOLONE PO Take by mouth.   progesterone  200 MG capsule Commonly known as: PROMETRIUM Take 200 mg by mouth daily.   UNABLE TO FIND DHEA SR (compound)- take 17 mg (1 tablet) daily except Saturday and Sunday   UNABLE TO FIND Hormone Topical Cream (Compound): Bi-Est (8:2) 1 mg, apply 1 gram every morning   UNABLE TO FIND Fentenyl Pain Patch, apply one 12 mcg patch every 3 days for chronic pain   UNABLE TO FIND Nature's Thryoid- take 3 g M, W and F, and 2 g every other day   UNABLE TO FIND Progesterone SR 300 mg (compound), take 1 tablet daily at bedtime   UNABLE TO FIND Seroquel (Compound)- 8.3 mg per mL, take 1 mL daily at bedtime   UNABLE TO FIND Pregnenolone 90 mg (compound), take 1 tablet daily   UNABLE TO FIND Testosterone Cream 0.5%- 1g/52mL       Signature:  Chesley Mires, MD [facility] Pager - [phone] 01/25/2020, 3:44 PM

## 2020-01-25 NOTE — Patient Instructions (Signed)
Follow up in 1 year.

## 2020-01-26 LAB — LITHIUM LEVEL: Lithium Lvl: 1.1 mmol/L (ref 0.5–1.2)

## 2020-01-26 NOTE — Progress Notes (Signed)
Lithium level is good.  Continue a total of 1,050 milligrams per day.

## 2020-01-31 IMAGING — CR DG SKULL COMPLETE 4+V
1 series · 5 of 5 positions shown · non-contrast
Comparison: 08/04/2014

CLINICAL DATA: Posterior skull pain, knot, question osteoma,

EXAM:
SKULL - COMPLETE 4 + VIEW

[Series 1: dg skull complete · 0.14mm/px · 5 of 5 slices shown]
[im 1/5]
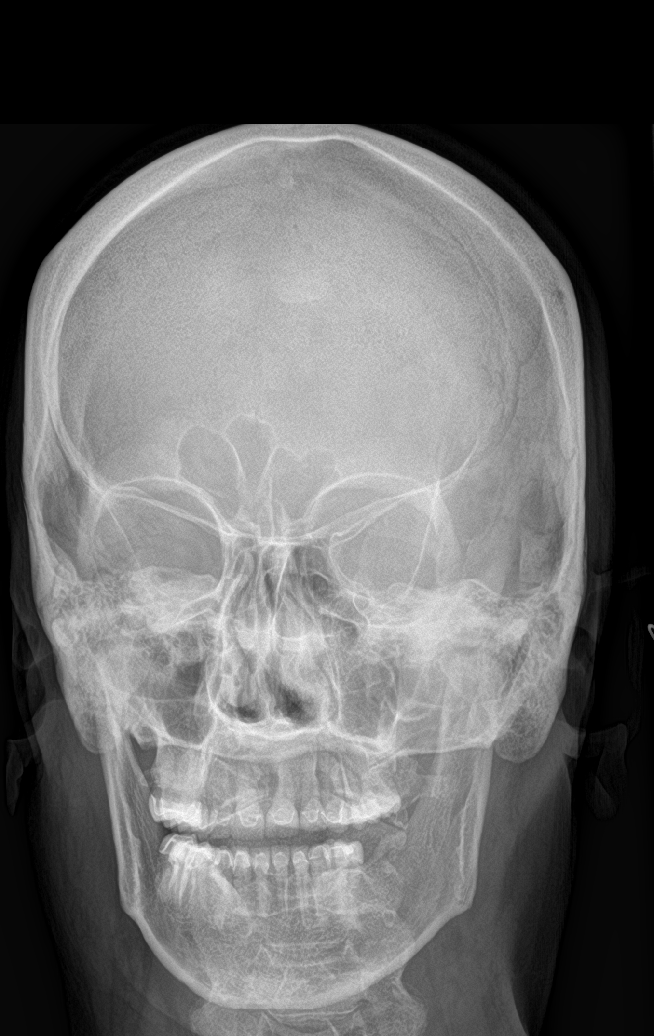
[im 2/5]
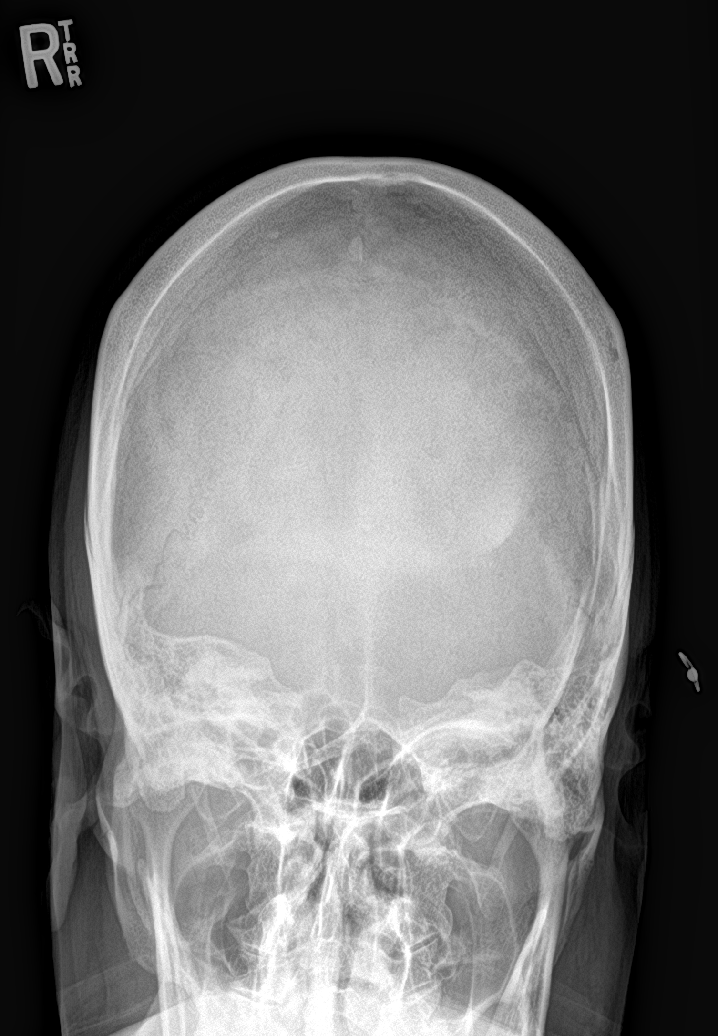
[im 3/5]
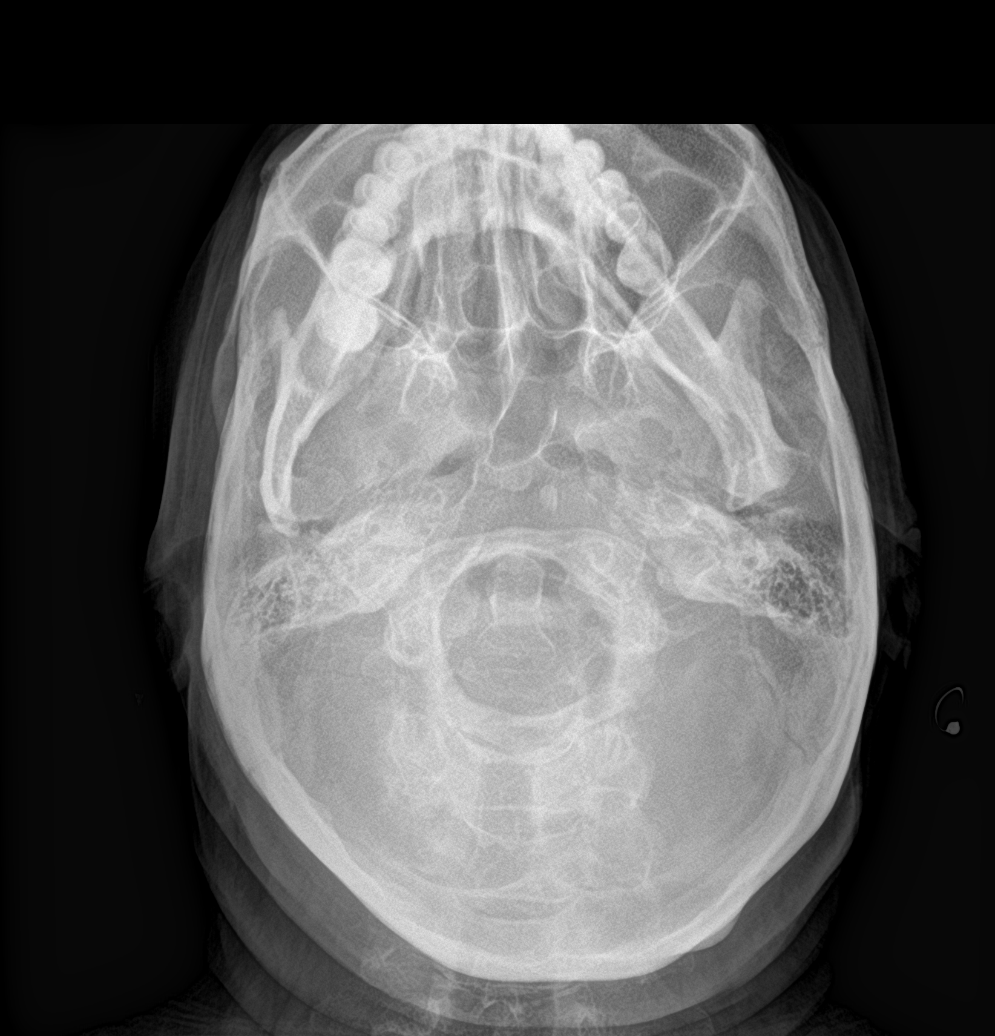
[im 4/5]
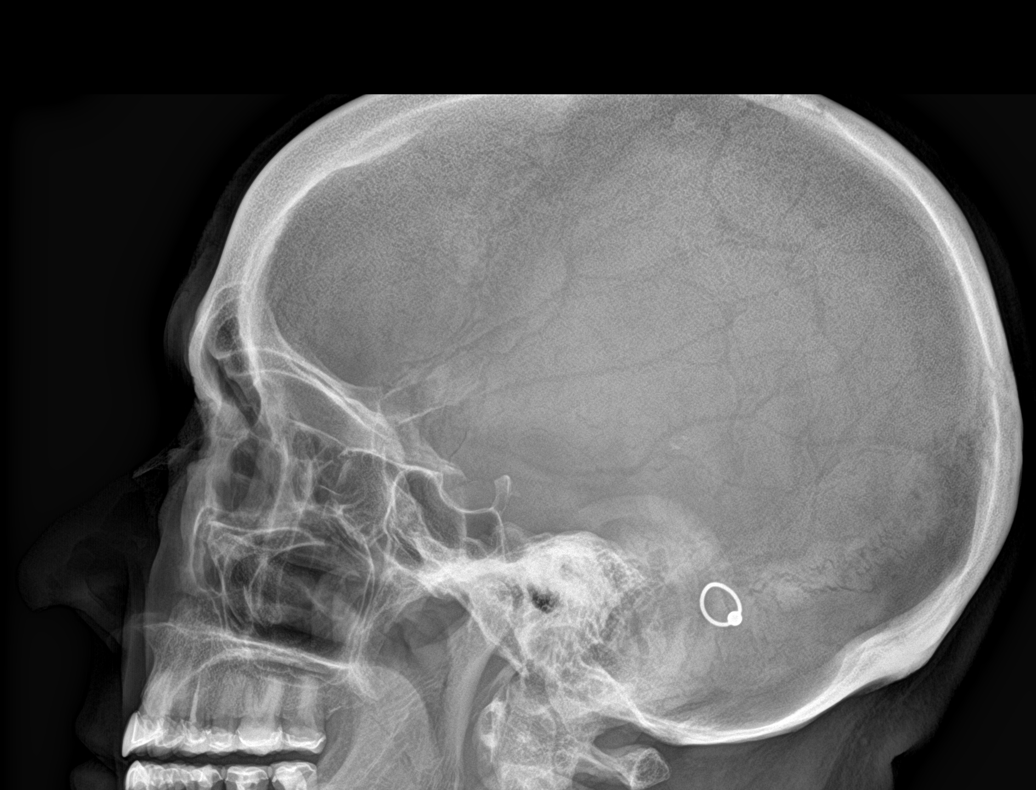
[im 5/5]
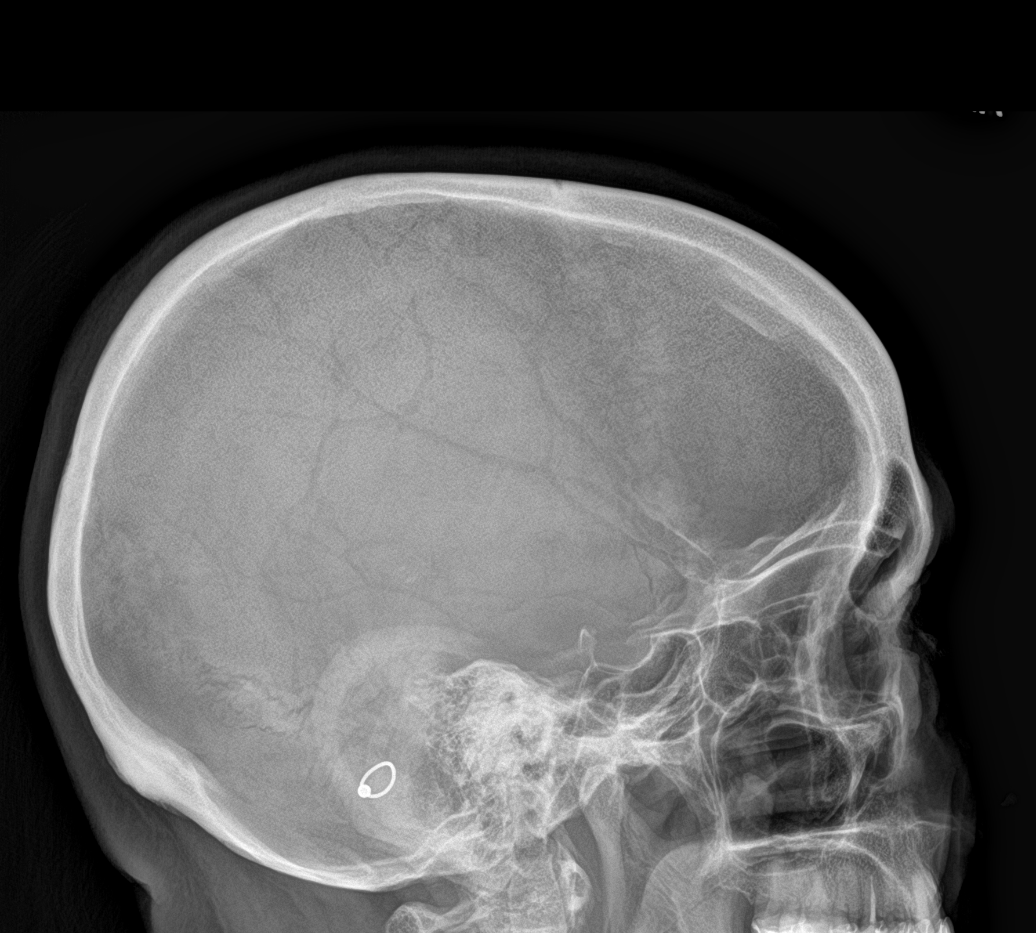

[5 of 5 positions shown; findings below may reference images not displayed]

FINDINGS: Osseous mineralization normal.

Slight calvaria asymmetry at the LEFT occipital bone, nonspecific,
unchanged.

No destructive or sclerotic bone lesions identified.

No other osseous abnormality seen.
IMPRESSION: Mild calvaria asymmetry at the LEFT occipital bone, chronic.

Otherwise negative exam.

## 2020-02-05 DIAGNOSIS — Z79899 Other long term (current) drug therapy: Secondary | ICD-10-CM | POA: Diagnosis not present

## 2020-02-05 DIAGNOSIS — N390 Urinary tract infection, site not specified: Secondary | ICD-10-CM | POA: Diagnosis not present

## 2020-02-10 DIAGNOSIS — F1120 Opioid dependence, uncomplicated: Secondary | ICD-10-CM | POA: Diagnosis not present

## 2020-02-10 DIAGNOSIS — G894 Chronic pain syndrome: Secondary | ICD-10-CM | POA: Diagnosis not present

## 2020-02-10 DIAGNOSIS — M2669 Other specified disorders of temporomandibular joint: Secondary | ICD-10-CM | POA: Diagnosis not present

## 2020-02-11 DIAGNOSIS — N951 Menopausal and female climacteric states: Secondary | ICD-10-CM | POA: Diagnosis not present

## 2020-02-11 DIAGNOSIS — Z87820 Personal history of traumatic brain injury: Secondary | ICD-10-CM | POA: Diagnosis not present

## 2020-02-11 DIAGNOSIS — H532 Diplopia: Secondary | ICD-10-CM | POA: Diagnosis not present

## 2020-02-11 DIAGNOSIS — H5500 Unspecified nystagmus: Secondary | ICD-10-CM | POA: Diagnosis not present

## 2020-02-11 DIAGNOSIS — H5319 Other subjective visual disturbances: Secondary | ICD-10-CM | POA: Diagnosis not present

## 2020-02-11 DIAGNOSIS — R7309 Other abnormal glucose: Secondary | ICD-10-CM | POA: Diagnosis not present

## 2020-02-11 DIAGNOSIS — E559 Vitamin D deficiency, unspecified: Secondary | ICD-10-CM | POA: Diagnosis not present

## 2020-02-11 DIAGNOSIS — E039 Hypothyroidism, unspecified: Secondary | ICD-10-CM | POA: Diagnosis not present

## 2020-02-11 DIAGNOSIS — R7989 Other specified abnormal findings of blood chemistry: Secondary | ICD-10-CM | POA: Diagnosis not present

## 2020-02-12 DIAGNOSIS — R413 Other amnesia: Secondary | ICD-10-CM | POA: Diagnosis not present

## 2020-02-12 DIAGNOSIS — H5319 Other subjective visual disturbances: Secondary | ICD-10-CM | POA: Diagnosis not present

## 2020-02-24 DIAGNOSIS — Z87820 Personal history of traumatic brain injury: Secondary | ICD-10-CM | POA: Diagnosis not present

## 2020-02-24 DIAGNOSIS — H5319 Other subjective visual disturbances: Secondary | ICD-10-CM | POA: Diagnosis not present

## 2020-02-24 DIAGNOSIS — R9082 White matter disease, unspecified: Secondary | ICD-10-CM | POA: Diagnosis not present

## 2020-02-24 DIAGNOSIS — H5500 Unspecified nystagmus: Secondary | ICD-10-CM | POA: Diagnosis not present

## 2020-02-24 DIAGNOSIS — H547 Unspecified visual loss: Secondary | ICD-10-CM | POA: Diagnosis not present

## 2020-02-24 DIAGNOSIS — H532 Diplopia: Secondary | ICD-10-CM | POA: Diagnosis not present

## 2020-02-29 ENCOUNTER — Other Ambulatory Visit: Payer: Self-pay | Admitting: Family Medicine

## 2020-02-29 ENCOUNTER — Other Ambulatory Visit: Payer: Self-pay | Admitting: Physician Assistant

## 2020-02-29 ENCOUNTER — Telehealth: Payer: Self-pay | Admitting: Physician Assistant

## 2020-02-29 DIAGNOSIS — Z79899 Other long term (current) drug therapy: Secondary | ICD-10-CM

## 2020-02-29 NOTE — Telephone Encounter (Signed)
Katherine James injured her back last week. She has been taking Ibuprofen 800mg   q 6hr. She wanted to let you know since she is taking lithium. Wants to check to see if this is ok. The Ibuprofen is the only way she is getting relief from the back pain right now.

## 2020-02-29 NOTE — Telephone Encounter (Signed)
Delice Bison please mail the lab orders to her.  Thank you.

## 2020-02-29 NOTE — Telephone Encounter (Signed)
I called patient back but got her voicemail.  I asked her to call the office back.  She does not have to speak with me, so whoever speaks with her please give her this message: It is okay for her to take the ibuprofen for short-term.  The concern is that ibuprofen plus lithium long-term can affect kidney function.  She is due to have labs drawn in the next few months anyway so we can go ahead and get those done.  I will order them and we can mail the orders to her.  I recommend she have them drawn in 1 to 2 weeks from now.

## 2020-03-01 ENCOUNTER — Ambulatory Visit: Payer: PPO | Admitting: Dermatology

## 2020-03-01 ENCOUNTER — Encounter: Payer: Self-pay | Admitting: Dermatology

## 2020-03-01 ENCOUNTER — Other Ambulatory Visit: Payer: Self-pay | Admitting: Family Medicine

## 2020-03-01 ENCOUNTER — Other Ambulatory Visit: Payer: Self-pay

## 2020-03-01 DIAGNOSIS — D1800 Hemangioma unspecified site: Secondary | ICD-10-CM | POA: Diagnosis not present

## 2020-03-01 DIAGNOSIS — D229 Melanocytic nevi, unspecified: Secondary | ICD-10-CM

## 2020-03-01 DIAGNOSIS — L821 Other seborrheic keratosis: Secondary | ICD-10-CM

## 2020-03-01 DIAGNOSIS — L820 Inflamed seborrheic keratosis: Secondary | ICD-10-CM

## 2020-03-01 DIAGNOSIS — Z1283 Encounter for screening for malignant neoplasm of skin: Secondary | ICD-10-CM | POA: Diagnosis not present

## 2020-03-01 DIAGNOSIS — L905 Scar conditions and fibrosis of skin: Secondary | ICD-10-CM | POA: Diagnosis not present

## 2020-03-01 DIAGNOSIS — B079 Viral wart, unspecified: Secondary | ICD-10-CM

## 2020-03-01 DIAGNOSIS — L720 Epidermal cyst: Secondary | ICD-10-CM | POA: Diagnosis not present

## 2020-03-01 DIAGNOSIS — Z85828 Personal history of other malignant neoplasm of skin: Secondary | ICD-10-CM | POA: Diagnosis not present

## 2020-03-01 DIAGNOSIS — L814 Other melanin hyperpigmentation: Secondary | ICD-10-CM | POA: Diagnosis not present

## 2020-03-01 DIAGNOSIS — L578 Other skin changes due to chronic exposure to nonionizing radiation: Secondary | ICD-10-CM | POA: Diagnosis not present

## 2020-03-01 DIAGNOSIS — Z86018 Personal history of other benign neoplasm: Secondary | ICD-10-CM

## 2020-03-01 DIAGNOSIS — Z79891 Long term (current) use of opiate analgesic: Secondary | ICD-10-CM | POA: Diagnosis not present

## 2020-03-01 DIAGNOSIS — D485 Neoplasm of uncertain behavior of skin: Secondary | ICD-10-CM

## 2020-03-01 DIAGNOSIS — R945 Abnormal results of liver function studies: Secondary | ICD-10-CM

## 2020-03-01 DIAGNOSIS — D225 Melanocytic nevi of trunk: Secondary | ICD-10-CM

## 2020-03-01 DIAGNOSIS — R7989 Other specified abnormal findings of blood chemistry: Secondary | ICD-10-CM

## 2020-03-01 DIAGNOSIS — D2239 Melanocytic nevi of other parts of face: Secondary | ICD-10-CM | POA: Diagnosis not present

## 2020-03-01 DIAGNOSIS — F1120 Opioid dependence, uncomplicated: Secondary | ICD-10-CM | POA: Diagnosis not present

## 2020-03-01 NOTE — Patient Instructions (Addendum)
Cryotherapy Aftercare  . Wash gently with soap and water everyday.   Katherine James Apply Vaseline and Band-Aid daily until healed.   Seborrheic Keratosis  What causes seborrheic keratoses? Seborrheic keratoses are harmless, common skin growths that first appear during adult life.  As time goes by, more growths appear.  Some people may develop a large number of them.  Seborrheic keratoses appear on both covered and uncovered body parts.  They are not caused by sunlight.  The tendency to develop seborrheic keratoses can be inherited.  They vary in color from skin-colored to gray, brown, or even black.  They can be either smooth or have a rough, warty surface.   Seborrheic keratoses are superficial and look as if they were stuck on the skin.  Under the microscope this type of keratosis looks like layers upon layers of skin.  That is why at times the top layer may seem to fall off, but the rest of the growth remains and re-grows.    Treatment Seborrheic keratoses do not need to be treated, but can easily be removed in the office.  Seborrheic keratoses often cause symptoms when they rub on clothing or jewelry.  Lesions can be in the way of shaving.  If they become inflamed, they can cause itching, soreness, or burning.  Removal of a seborrheic keratosis can be accomplished by freezing, burning, or surgery. If any spot bleeds, scabs, or grows rapidly, please return to have it checked, as these can be an indication of a skin cancer.  Melanoma ABCDEs  Melanoma is the most dangerous type of skin cancer, and is the leading cause of death from skin disease.  You are more likely to develop melanoma if you:  Have light-colored skin, light-colored eyes, or red or blond hair  Spend a lot of time in the sun  Tan regularly, either outdoors or in a tanning bed  Have had blistering sunburns, especially during childhood  Have a close family member who has had a melanoma  Have atypical moles or large  birthmarks  Early detection of melanoma is key since treatment is typically straightforward and cure rates are extremely high if we catch it early.   The first sign of melanoma is often a change in a mole or a new dark spot.  The ABCDE system is a way of remembering the signs of melanoma.  A for asymmetry:  The two halves do not match. B for border:  The edges of the growth are irregular. C for color:  A mixture of colors are present instead of an even brown color. D for diameter:  Melanomas are usually (but not always) greater than 14mm - the size of a pencil eraser. E for evolution:  The spot keeps changing in size, shape, and color.  Please check your skin once per month between visits. You can use a small mirror in front and a large mirror behind you to keep an eye on the back side or your body.   If you see any new or changing lesions before your next follow-up, please call to schedule a visit.  Please continue daily skin protection including broad spectrum sunscreen SPF 30+ to sun-exposed areas, reapplying every 2 hours as needed when you're outdoors.    Wound Care Instructions  1. Cleanse wound gently with soap and water once a day then pat dry with clean gauze. Apply a thing coat of Petrolatum (petroleum jelly, "Vaseline") over the wound (unless you have an allergy to this). We recommend  that you use a new, sterile tube of Vaseline. Do not pick or remove scabs. Do not remove the yellow or white "healing tissue" from the base of the wound.  2. Cover the wound with fresh, clean, nonstick gauze and secure with paper tape. You may use Band-Aids in place of gauze and tape if the would is small enough, but would recommend trimming much of the tape off as there is often too much. Sometimes Band-Aids can irritate the skin.  3. You should call the office for your biopsy report after 1 week if you have not already been contacted.  4. If you experience any problems, such as abnormal amounts of  bleeding, swelling, significant bruising, significant pain, or evidence of infection, please call the office immediately.

## 2020-03-01 NOTE — Progress Notes (Signed)
Follow-Up Visit   Subjective  Katherine James is a 60 y.o. female who presents for the following: Annual Exam (She has several spots to check that are changing. She has a history of dysplastic nevi and BCC).  Some are bothersome and irritated.   The following portions of the chart were reviewed this encounter and updated as appropriate:       Review of Systems:  No other skin or systemic complaints except as noted in HPI or Assessment and Plan.  Objective  Well appearing patient in no apparent distress; mood and affect are within normal limits.  A full examination was performed including scalp, head, eyes, ears, nose, lips, neck, chest, axillae, abdomen, back, buttocks, bilateral upper extremities, bilateral lower extremities, hands, feet, fingers, toes, fingernails, and toenails. All findings within normal limits unless otherwise noted below.  Objective  Left Zygoma: 3.64mm flesh brown papule      Objective  Right Mid Back: 5.53mm two-toned brown papule     Objective  Mid forehead x 1 (recurrent), R nasal root x 1, R upper eyebrow x 1, L lower back x 1, L chest x 1 (5): Erythematous keratotic or waxy stuck-on papule.  Objective  R lateral index finger: 3.73mm flat flesh papule  Objective  Left Hand Dorsum: 4.3mm waxy tan papule.  Objective  Right Flank: Subcutaneous nodule, 5.93mm  Objective  Back: Tan-brown and/or pink-flesh-colored symmetric macules and papules.   Images                 Assessment & Plan   Skin cancer screening performed today.  Actinic Damage - chronic, secondary to cumulative UV radiation exposure/sun exposure over time - diffuse scaly erythematous macules with underlying dyspigmentation - Recommend daily broad spectrum sunscreen SPF 30+ to sun-exposed areas, reapply every 2 hours as needed.  - Call for new or changing lesions.  History of Basal Cell Carcinoma of the Skin - No evidence of recurrence today - Recommend  regular full body skin exams - Recommend daily broad spectrum sunscreen SPF 30+ to sun-exposed areas, reapply every 2 hours as needed.  - Call if any new or changing lesions are noted between office visits  History of Dysplastic Nevi - No evidence of recurrence today - Recommend regular full body skin exams - Recommend daily broad spectrum sunscreen SPF 30+ to sun-exposed areas, reapply every 2 hours as needed.  - Call if any new or changing lesions are noted between office visits  Melanocytic Nevi - Tan-brown and/or pink-flesh-colored symmetric macules and papules - Benign appearing on exam today - Observation - Call clinic for new or changing moles - Recommend daily use of broad spectrum spf 30+ sunscreen to sun-exposed areas.   Lentigines - Scattered tan macules - Discussed due to sun exposure - Benign, observe - Recommend daily broad spectrum sunscreen SPF 30+ to sun-exposed areas, reapply every 2 hours as needed. - Call for any changes  Hemangiomas - Red papules - Discussed benign nature - Observe - Call for any changes  Seborrheic Keratoses - Stuck-on, waxy, tan-brown papules and plaques  - Discussed benign etiology and prognosis. - Observe - Call for any changes - Sample of CeraVe Psoriasis  Neoplasm of uncertain behavior of skin (2) Left Zygoma  Epidermal / dermal shaving  Lesion diameter (cm):  0.4 Informed consent: discussed and consent obtained   Patient was prepped and draped in usual sterile fashion: Area prepped with alcohol. Anesthesia: the lesion was anesthetized in a standard fashion   Anesthetic:  1% lidocaine w/ epinephrine 1-100,000 buffered w/ 8.4% NaHCO3 Instrument used: flexible razor blade   Hemostasis achieved with: pressure, aluminum chloride and electrodesiccation   Outcome: patient tolerated procedure well   Post-procedure details: wound care instructions given   Post-procedure details comment:  Ointment and small bandage  applied  Specimen 1 - Surgical pathology Differential Diagnosis: Irritated Nevus vs Irritated Sebaceous Hyperplasia Check Margins: No 3.55mm flesh brown papule  Right Mid Back  Epidermal / dermal shaving  Lesion diameter (cm):  0.7 Informed consent: discussed and consent obtained   Patient was prepped and draped in usual sterile fashion: Area prepped with alcohol. Anesthesia: the lesion was anesthetized in a standard fashion   Anesthetic:  1% lidocaine w/ epinephrine 1-100,000 buffered w/ 8.4% NaHCO3 Instrument used: flexible razor blade   Hemostasis achieved with: pressure, aluminum chloride and electrodesiccation   Outcome: patient tolerated procedure well   Post-procedure details: wound care instructions given   Post-procedure details comment:  Ointment and small bandage applied  Specimen 2 - Surgical pathology Differential Diagnosis: r/o Dysplastic Nevus Check Margins: Yes 5.22mm two-toned brown papule  Inflamed seborrheic keratosis (5) Mid forehead x 1 (recurrent), R nasal root x 1, R upper eyebrow x 1, L lower back x 1, L chest x 1  Recheck L Chest on f/up  Destruction of lesion - Mid forehead x 1 (recurrent), R nasal root x 1, R upper eyebrow x 1, L lower back x 1, L chest x 1  Destruction method: cryotherapy   Informed consent: discussed and consent obtained   Lesion destroyed using liquid nitrogen: Yes   Region frozen until ice ball extended beyond lesion: Yes   Outcome: patient tolerated procedure well with no complications   Post-procedure details: wound care instructions given    Viral warts, unspecified type R lateral index finger  Discussed viral etiology and risk of spread.  Discussed multiple treatments may be required to clear warts.  Discussed possible post-treatment dyspigmentation and risk of recurrence.   Destruction of lesion - R lateral index finger  Destruction method: cryotherapy   Informed consent: discussed and consent obtained   Lesion  destroyed using liquid nitrogen: Yes   Region frozen until ice ball extended beyond lesion: Yes   Outcome: patient tolerated procedure well with no complications   Post-procedure details: wound care instructions given    Seborrheic keratosis Left Hand Dorsum  Vs Nevus  Benign-appearing.  Observation.  Call clinic for new or changing moles.  Recommend daily use of broad spectrum spf 30+ sunscreen to sun-exposed areas.    Epidermal inclusion cyst Right Flank  Cyst with symptoms and/or recent change.  Discussed surgical excision to remove, including resulting scar and possible recurrence.  Patient may schedule for surgery.   Nevus Back  Benign-appearing.  Observation.  Call clinic for new or changing moles.  Recommend daily use of broad spectrum spf 30+ sunscreen to sun-exposed areas.    Return in about 1 year (around 03/01/2021) for TBSE.   Documentation: I have reviewed the above documentation for accuracy and completeness, and I agree with the above.  Brendolyn Patty MD

## 2020-03-02 NOTE — Telephone Encounter (Signed)
Lab orders have been mailed to pt. On 03/03/20.

## 2020-03-04 DIAGNOSIS — M545 Low back pain, unspecified: Secondary | ICD-10-CM | POA: Diagnosis not present

## 2020-03-04 DIAGNOSIS — M7542 Impingement syndrome of left shoulder: Secondary | ICD-10-CM | POA: Diagnosis not present

## 2020-03-07 ENCOUNTER — Telehealth: Payer: Self-pay

## 2020-03-07 NOTE — Telephone Encounter (Signed)
-----   Message from Brendolyn Patty, MD sent at 03/07/2020  8:34 AM EST ----- 1. Skin , left zygoma MELANOCYTIC NEVUS, COMPOUND TYPE WITH SCAR, DEEP MARGIN INVOLVED 2. Skin , right mid back DYSPLASTIC COMPOUND NEVUS WITH SEVERE ATYPIA, CLOSE TO MARGIN  1. Benign mole 2. Severely atypical mole- needs excision

## 2020-03-07 NOTE — Telephone Encounter (Signed)
Lft pt msg to call for bx results/sh °

## 2020-03-07 NOTE — Telephone Encounter (Signed)
Informed pt of path results. She had no concerns. Pt states that she will need to call back to schedule excision.

## 2020-03-07 NOTE — Telephone Encounter (Signed)
-----   Message from Tara Stewart, MD sent at 03/07/2020  8:34 AM EST ----- 1. Skin , left zygoma MELANOCYTIC NEVUS, COMPOUND TYPE WITH SCAR, DEEP MARGIN INVOLVED 2. Skin , right mid back DYSPLASTIC COMPOUND NEVUS WITH SEVERE ATYPIA, CLOSE TO MARGIN  1. Benign mole 2. Severely atypical mole- needs excision 

## 2020-03-09 ENCOUNTER — Other Ambulatory Visit: Payer: Self-pay | Admitting: Physician Assistant

## 2020-03-09 DIAGNOSIS — M2669 Other specified disorders of temporomandibular joint: Secondary | ICD-10-CM | POA: Diagnosis not present

## 2020-03-09 DIAGNOSIS — G894 Chronic pain syndrome: Secondary | ICD-10-CM | POA: Diagnosis not present

## 2020-03-09 DIAGNOSIS — F1120 Opioid dependence, uncomplicated: Secondary | ICD-10-CM | POA: Diagnosis not present

## 2020-03-09 NOTE — Telephone Encounter (Signed)
Last apt 11/11, next apt 07/05/20

## 2020-03-10 DIAGNOSIS — H5500 Unspecified nystagmus: Secondary | ICD-10-CM | POA: Diagnosis not present

## 2020-03-15 DIAGNOSIS — M7542 Impingement syndrome of left shoulder: Secondary | ICD-10-CM | POA: Diagnosis not present

## 2020-03-16 ENCOUNTER — Telehealth: Payer: Self-pay | Admitting: Addiction (Substance Use Disorder)

## 2020-03-16 NOTE — Telephone Encounter (Signed)
Katherine James called and asked about making an appt with you for brainspotting. She sees [place]. She said that she was told by you that you would see her. Do you know anything about this? I told her we would call her back.

## 2020-03-18 DIAGNOSIS — F332 Major depressive disorder, recurrent severe without psychotic features: Secondary | ICD-10-CM | POA: Diagnosis not present

## 2020-03-21 ENCOUNTER — Other Ambulatory Visit: Payer: Self-pay | Admitting: Physician Assistant

## 2020-03-21 DIAGNOSIS — F332 Major depressive disorder, recurrent severe without psychotic features: Secondary | ICD-10-CM | POA: Diagnosis not present

## 2020-03-22 ENCOUNTER — Other Ambulatory Visit: Payer: Self-pay | Admitting: Physician Assistant

## 2020-03-23 DIAGNOSIS — F332 Major depressive disorder, recurrent severe without psychotic features: Secondary | ICD-10-CM | POA: Diagnosis not present

## 2020-03-24 ENCOUNTER — Other Ambulatory Visit: Payer: Self-pay

## 2020-03-24 ENCOUNTER — Ambulatory Visit
Admission: RE | Admit: 2020-03-24 | Discharge: 2020-03-24 | Disposition: A | Discharge status: Home / Self Care | Payer: PPO | Source: Ambulatory Visit | Attending: Family Medicine | Admitting: Family Medicine

## 2020-03-24 DIAGNOSIS — E063 Autoimmune thyroiditis: Secondary | ICD-10-CM | POA: Diagnosis not present

## 2020-03-24 DIAGNOSIS — N951 Menopausal and female climacteric states: Secondary | ICD-10-CM | POA: Diagnosis not present

## 2020-03-24 DIAGNOSIS — Z79899 Other long term (current) drug therapy: Secondary | ICD-10-CM | POA: Diagnosis not present

## 2020-03-24 DIAGNOSIS — R945 Abnormal results of liver function studies: Secondary | ICD-10-CM | POA: Insufficient documentation

## 2020-03-24 DIAGNOSIS — E039 Hypothyroidism, unspecified: Secondary | ICD-10-CM | POA: Diagnosis not present

## 2020-03-24 DIAGNOSIS — R5381 Other malaise: Secondary | ICD-10-CM | POA: Diagnosis not present

## 2020-03-24 DIAGNOSIS — R7989 Other specified abnormal findings of blood chemistry: Secondary | ICD-10-CM

## 2020-03-24 DIAGNOSIS — Z79891 Long term (current) use of opiate analgesic: Secondary | ICD-10-CM | POA: Diagnosis not present

## 2020-03-24 DIAGNOSIS — F1120 Opioid dependence, uncomplicated: Secondary | ICD-10-CM | POA: Diagnosis not present

## 2020-03-25 ENCOUNTER — Other Ambulatory Visit: Payer: Self-pay | Admitting: Physician Assistant

## 2020-03-25 DIAGNOSIS — F332 Major depressive disorder, recurrent severe without psychotic features: Secondary | ICD-10-CM | POA: Diagnosis not present

## 2020-03-25 LAB — COMPREHENSIVE METABOLIC PANEL
ALT: 17 IU/L (ref 0–32)
AST: 16 IU/L (ref 0–40)
Albumin/Globulin Ratio: 2.9 — ABNORMAL HIGH (ref 1.2–2.2)
Albumin: 5 g/dL — ABNORMAL HIGH (ref 3.8–4.9)
Alkaline Phosphatase: 121 IU/L (ref 44–121)
BUN/Creatinine Ratio: 16 (ref 9–23)
BUN: 12 mg/dL (ref 6–24)
Bilirubin Total: 0.3 mg/dL (ref 0.0–1.2)
CO2: 23 mmol/L (ref 20–29)
Calcium: 10.2 mg/dL (ref 8.7–10.2)
Chloride: 107 mmol/L — ABNORMAL HIGH (ref 96–106)
Creatinine, Ser: 0.75 mg/dL (ref 0.57–1.00)
GFR calc Af Amer: 102 mL/min/{1.73_m2} (ref >59)
GFR calc non Af Amer: 88 mL/min/{1.73_m2} (ref >59)
Globulin, Total: 1.7 g/dL (ref 1.5–4.5)
Glucose: 97 mg/dL (ref 65–99)
Potassium: 4.8 mmol/L (ref 3.5–5.2)
Sodium: 145 mmol/L — ABNORMAL HIGH (ref 134–144)
Total Protein: 6.7 g/dL (ref 6.0–8.5)

## 2020-03-25 LAB — LITHIUM LEVEL: Lithium Lvl: 0.7 mmol/L (ref 0.5–1.2)

## 2020-03-25 LAB — TSH: TSH: 0.232 u[IU]/mL — ABNORMAL LOW (ref 0.450–4.500)

## 2020-03-25 NOTE — Telephone Encounter (Signed)
Pt called and said that she will be out tomorrow. Please send it in today asap

## 2020-03-25 NOTE — Progress Notes (Signed)
Lithium level is good at 0.7.  No change in dose. Kidney functions, liver function are normal. Her TSH is low at 0.232.  Does her PCP Take Care of her Synthroid?  I think the Synthroid needs to be decreased but will defer that decision to the person following her thyroid disease.  Please make sure other providers get these labs.  Thank you

## 2020-03-27 DIAGNOSIS — M797 Fibromyalgia: Secondary | ICD-10-CM | POA: Diagnosis not present

## 2020-03-27 DIAGNOSIS — Z881 Allergy status to other antibiotic agents status: Secondary | ICD-10-CM | POA: Diagnosis not present

## 2020-03-27 DIAGNOSIS — H10211 Acute toxic conjunctivitis, right eye: Secondary | ICD-10-CM | POA: Diagnosis not present

## 2020-03-27 DIAGNOSIS — Z885 Allergy status to narcotic agent status: Secondary | ICD-10-CM | POA: Diagnosis not present

## 2020-03-27 DIAGNOSIS — T5491XA Toxic effect of unspecified corrosive substance, accidental (unintentional), initial encounter: Secondary | ICD-10-CM | POA: Diagnosis not present

## 2020-03-27 DIAGNOSIS — Z887 Allergy status to serum and vaccine status: Secondary | ICD-10-CM | POA: Diagnosis not present

## 2020-03-27 DIAGNOSIS — F319 Bipolar disorder, unspecified: Secondary | ICD-10-CM | POA: Diagnosis not present

## 2020-03-27 DIAGNOSIS — Z888 Allergy status to other drugs, medicaments and biological substances status: Secondary | ICD-10-CM | POA: Diagnosis not present

## 2020-03-27 DIAGNOSIS — Z79899 Other long term (current) drug therapy: Secondary | ICD-10-CM | POA: Diagnosis not present

## 2020-03-27 DIAGNOSIS — H5711 Ocular pain, right eye: Secondary | ICD-10-CM | POA: Diagnosis not present

## 2020-03-27 DIAGNOSIS — Z882 Allergy status to sulfonamides status: Secondary | ICD-10-CM | POA: Diagnosis not present

## 2020-03-27 DIAGNOSIS — E079 Disorder of thyroid, unspecified: Secondary | ICD-10-CM | POA: Diagnosis not present

## 2020-03-27 DIAGNOSIS — S0501XA Injury of conjunctiva and corneal abrasion without foreign body, right eye, initial encounter: Secondary | ICD-10-CM | POA: Diagnosis not present

## 2020-03-27 DIAGNOSIS — Z79890 Hormone replacement therapy (postmenopausal): Secondary | ICD-10-CM | POA: Diagnosis not present

## 2020-03-28 DIAGNOSIS — F332 Major depressive disorder, recurrent severe without psychotic features: Secondary | ICD-10-CM | POA: Diagnosis not present

## 2020-03-30 DIAGNOSIS — F332 Major depressive disorder, recurrent severe without psychotic features: Secondary | ICD-10-CM | POA: Diagnosis not present

## 2020-04-04 DIAGNOSIS — F332 Major depressive disorder, recurrent severe without psychotic features: Secondary | ICD-10-CM | POA: Diagnosis not present

## 2020-04-06 DIAGNOSIS — F1120 Opioid dependence, uncomplicated: Secondary | ICD-10-CM | POA: Diagnosis not present

## 2020-04-06 DIAGNOSIS — G894 Chronic pain syndrome: Secondary | ICD-10-CM | POA: Diagnosis not present

## 2020-04-06 DIAGNOSIS — M2669 Other specified disorders of temporomandibular joint: Secondary | ICD-10-CM | POA: Diagnosis not present

## 2020-04-07 DIAGNOSIS — F332 Major depressive disorder, recurrent severe without psychotic features: Secondary | ICD-10-CM | POA: Diagnosis not present

## 2020-04-14 ENCOUNTER — Other Ambulatory Visit: Payer: Self-pay | Admitting: Family Medicine

## 2020-04-14 ENCOUNTER — Other Ambulatory Visit (HOSPITAL_COMMUNITY): Payer: Self-pay | Admitting: Family Medicine

## 2020-04-14 DIAGNOSIS — R42 Dizziness and giddiness: Secondary | ICD-10-CM

## 2020-04-14 DIAGNOSIS — H5509 Other forms of nystagmus: Secondary | ICD-10-CM

## 2020-04-14 DIAGNOSIS — R2681 Unsteadiness on feet: Secondary | ICD-10-CM

## 2020-04-15 DIAGNOSIS — F1120 Opioid dependence, uncomplicated: Secondary | ICD-10-CM | POA: Diagnosis not present

## 2020-04-15 DIAGNOSIS — Z79891 Long term (current) use of opiate analgesic: Secondary | ICD-10-CM | POA: Diagnosis not present

## 2020-04-19 ENCOUNTER — Other Ambulatory Visit: Payer: Self-pay

## 2020-04-19 ENCOUNTER — Ambulatory Visit (INDEPENDENT_AMBULATORY_CARE_PROVIDER_SITE_OTHER): Payer: PPO | Admitting: Addiction (Substance Use Disorder)

## 2020-04-19 DIAGNOSIS — F319 Bipolar disorder, unspecified: Secondary | ICD-10-CM | POA: Diagnosis not present
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
## 2020-04-21 ENCOUNTER — Ambulatory Visit
Admission: RE | Admit: 2020-04-21 | Discharge: 2020-04-21 | Disposition: A | Discharge status: Home / Self Care | Payer: PPO | Source: Ambulatory Visit | Attending: Family Medicine | Admitting: Family Medicine

## 2020-04-21 ENCOUNTER — Other Ambulatory Visit: Payer: Self-pay

## 2020-04-21 DIAGNOSIS — I6523 Occlusion and stenosis of bilateral carotid arteries: Secondary | ICD-10-CM | POA: Diagnosis not present

## 2020-04-21 DIAGNOSIS — H5509 Other forms of nystagmus: Secondary | ICD-10-CM | POA: Insufficient documentation

## 2020-04-21 DIAGNOSIS — R42 Dizziness and giddiness: Secondary | ICD-10-CM | POA: Insufficient documentation

## 2020-04-21 DIAGNOSIS — H5500 Unspecified nystagmus: Secondary | ICD-10-CM | POA: Diagnosis not present

## 2020-04-21 DIAGNOSIS — R2681 Unsteadiness on feet: Secondary | ICD-10-CM | POA: Insufficient documentation

## 2020-04-25 ENCOUNTER — Other Ambulatory Visit: Payer: Self-pay

## 2020-04-25 ENCOUNTER — Encounter: Payer: Self-pay | Admitting: Dermatology

## 2020-04-25 ENCOUNTER — Ambulatory Visit: Payer: PPO | Admitting: Dermatology

## 2020-04-25 DIAGNOSIS — L988 Other specified disorders of the skin and subcutaneous tissue: Secondary | ICD-10-CM | POA: Diagnosis not present

## 2020-04-25 DIAGNOSIS — D235 Other benign neoplasm of skin of trunk: Secondary | ICD-10-CM

## 2020-04-25 DIAGNOSIS — D239 Other benign neoplasm of skin, unspecified: Secondary | ICD-10-CM

## 2020-04-25 NOTE — Patient Instructions (Signed)

## 2020-04-25 NOTE — Progress Notes (Signed)
   Follow-Up Visit   Subjective  Katherine James is a 60 y.o. female who presents for the following: Severe dysplastic nevus bx proven (R mid back, pt presents for excision).   The following portions of the chart were reviewed this encounter and updated as appropriate:       Review of Systems:  No other skin or systemic complaints except as noted in HPI or Assessment and Plan.  Objective  Well appearing patient in no apparent distress; mood and affect are within normal limits.  A focused examination was performed including back. Relevant physical exam findings are noted in the Assessment and Plan.  Objective  R Mid Back: Pink bx site 10.0 x 8.47mm   Assessment & Plan  Dysplastic nevus R Mid Back  Bx proven  Skin excision - R Mid Back  Lesion length (cm):  1 Lesion width (cm):  0.8 Margin per side (cm):  0.2 Total excision diameter (cm):  1.4 Informed consent: discussed and consent obtained   Timeout: patient name, date of birth, surgical site, and procedure verified   Procedure prep:  Patient was prepped and draped in usual sterile fashion Prep type:  Povidone-iodine Anesthesia: the lesion was anesthetized in a standard fashion   Anesthetic:  1% lidocaine w/ epinephrine 1-100,000 buffered w/ 8.4% NaHCO3 (16.0cc) Instrument used: #15 blade   Hemostasis achieved with: pressure and electrodesiccation   Outcome: patient tolerated procedure well with no complications   Additional details:  Tag at inferior 6:00 tip  Skin repair - R Mid Back Complexity:  Intermediate Final length (cm):  2.5 Informed consent: discussed and consent obtained   Reason for type of repair: reduce tension to allow closure, reduce the risk of dehiscence, infection, and necrosis and reduce subcutaneous dead space and avoid a hematoma   Undermining: edges undermined   Subcutaneous layers (deep stitches):  Suture size:  3-0 Suture type: Vicryl (polyglactin 910)   Stitches:  Buried vertical  mattress Fine/surface layer approximation (top stitches):  Suture size:  3-0 Suture type: nylon   Stitches: vertical mattress and simple interrupted   Suture removal (days):  7 Hemostasis achieved with: suture Outcome: patient tolerated procedure well with no complications   Post-procedure details: sterile dressing applied and wound care instructions given   Dressing type: pressure dressing (Mupirocin)    Specimen 1 - Surgical pathology Differential Diagnosis: D48.5 Severe Dysplastic Nevus bx proven Tag at inferior 6:00 tip Check Margins: yes Pink bx site 1.0 x 0.8cm [id number]  Return in about 1 week (around 05/02/2020) for suture removal.  I, Sonya Hupman, RMA, am acting as scribe for Brendolyn Patty, MD . Documentation: I have reviewed the above documentation for accuracy and completeness, and I agree with the above.  Brendolyn Patty MD

## 2020-04-26 ENCOUNTER — Telehealth: Payer: Self-pay

## 2020-04-26 NOTE — Telephone Encounter (Signed)
Left pt message to call if any problems after yesterdays surgery./sh 

## 2020-04-28 DIAGNOSIS — Z87820 Personal history of traumatic brain injury: Secondary | ICD-10-CM | POA: Diagnosis not present

## 2020-04-28 DIAGNOSIS — H5509 Other forms of nystagmus: Secondary | ICD-10-CM | POA: Diagnosis not present

## 2020-04-28 DIAGNOSIS — H5319 Other subjective visual disturbances: Secondary | ICD-10-CM | POA: Diagnosis not present

## 2020-04-30 DIAGNOSIS — F319 Bipolar disorder, unspecified: Secondary | ICD-10-CM | POA: Diagnosis not present

## 2020-04-30 DIAGNOSIS — K5792 Diverticulitis of intestine, part unspecified, without perforation or abscess without bleeding: Secondary | ICD-10-CM | POA: Diagnosis not present

## 2020-04-30 DIAGNOSIS — K769 Liver disease, unspecified: Secondary | ICD-10-CM | POA: Diagnosis not present

## 2020-04-30 DIAGNOSIS — Z79899 Other long term (current) drug therapy: Secondary | ICD-10-CM | POA: Diagnosis not present

## 2020-04-30 DIAGNOSIS — M797 Fibromyalgia: Secondary | ICD-10-CM | POA: Diagnosis not present

## 2020-04-30 DIAGNOSIS — G8929 Other chronic pain: Secondary | ICD-10-CM | POA: Diagnosis not present

## 2020-04-30 DIAGNOSIS — R1032 Left lower quadrant pain: Secondary | ICD-10-CM | POA: Diagnosis not present

## 2020-04-30 DIAGNOSIS — E039 Hypothyroidism, unspecified: Secondary | ICD-10-CM | POA: Diagnosis not present

## 2020-04-30 DIAGNOSIS — H5702 Anisocoria: Secondary | ICD-10-CM | POA: Diagnosis not present

## 2020-04-30 DIAGNOSIS — F32A Depression, unspecified: Secondary | ICD-10-CM | POA: Diagnosis not present

## 2020-04-30 DIAGNOSIS — K5732 Diverticulitis of large intestine without perforation or abscess without bleeding: Secondary | ICD-10-CM | POA: Diagnosis not present

## 2020-04-30 DIAGNOSIS — G4730 Sleep apnea, unspecified: Secondary | ICD-10-CM | POA: Diagnosis not present

## 2020-05-01 DIAGNOSIS — G8929 Other chronic pain: Secondary | ICD-10-CM | POA: Diagnosis not present

## 2020-05-01 DIAGNOSIS — E039 Hypothyroidism, unspecified: Secondary | ICD-10-CM | POA: Diagnosis not present

## 2020-05-01 DIAGNOSIS — K5732 Diverticulitis of large intestine without perforation or abscess without bleeding: Secondary | ICD-10-CM | POA: Diagnosis not present

## 2020-05-01 DIAGNOSIS — F32A Depression, unspecified: Secondary | ICD-10-CM | POA: Diagnosis not present

## 2020-05-02 DIAGNOSIS — G8929 Other chronic pain: Secondary | ICD-10-CM | POA: Diagnosis not present

## 2020-05-02 DIAGNOSIS — K5732 Diverticulitis of large intestine without perforation or abscess without bleeding: Secondary | ICD-10-CM | POA: Diagnosis not present

## 2020-05-02 DIAGNOSIS — Z789 Other specified health status: Secondary | ICD-10-CM | POA: Insufficient documentation

## 2020-05-02 DIAGNOSIS — E039 Hypothyroidism, unspecified: Secondary | ICD-10-CM | POA: Diagnosis not present

## 2020-05-02 DIAGNOSIS — F32A Depression, unspecified: Secondary | ICD-10-CM | POA: Diagnosis not present

## 2020-05-03 ENCOUNTER — Telehealth: Payer: Self-pay | Admitting: Physician Assistant

## 2020-05-03 DIAGNOSIS — G8929 Other chronic pain: Secondary | ICD-10-CM | POA: Diagnosis not present

## 2020-05-03 DIAGNOSIS — K5732 Diverticulitis of large intestine without perforation or abscess without bleeding: Secondary | ICD-10-CM | POA: Diagnosis not present

## 2020-05-03 DIAGNOSIS — E039 Hypothyroidism, unspecified: Secondary | ICD-10-CM | POA: Diagnosis not present

## 2020-05-03 DIAGNOSIS — F32A Depression, unspecified: Secondary | ICD-10-CM | POA: Diagnosis not present

## 2020-05-03 NOTE — Telephone Encounter (Signed)
Pt would like to start back on Belsomra to help her sleep. Pt would like to discuss the dosage first. Please call.

## 2020-05-03 NOTE — Telephone Encounter (Signed)
Please triage

## 2020-05-04 ENCOUNTER — Other Ambulatory Visit: Payer: Self-pay | Admitting: Physician Assistant

## 2020-05-04 ENCOUNTER — Telehealth: Payer: Self-pay

## 2020-05-04 ENCOUNTER — Other Ambulatory Visit: Payer: Self-pay

## 2020-05-04 ENCOUNTER — Ambulatory Visit: Payer: PPO

## 2020-05-04 DIAGNOSIS — Z86018 Personal history of other benign neoplasm: Secondary | ICD-10-CM

## 2020-05-04 DIAGNOSIS — M2669 Other specified disorders of temporomandibular joint: Secondary | ICD-10-CM | POA: Diagnosis not present

## 2020-05-04 DIAGNOSIS — G894 Chronic pain syndrome: Secondary | ICD-10-CM | POA: Diagnosis not present

## 2020-05-04 DIAGNOSIS — K5792 Diverticulitis of intestine, part unspecified, without perforation or abscess without bleeding: Secondary | ICD-10-CM | POA: Diagnosis not present

## 2020-05-04 DIAGNOSIS — K5732 Diverticulitis of large intestine without perforation or abscess without bleeding: Secondary | ICD-10-CM | POA: Diagnosis not present

## 2020-05-04 DIAGNOSIS — F1120 Opioid dependence, uncomplicated: Secondary | ICD-10-CM | POA: Diagnosis not present

## 2020-05-04 MED ORDER — BELSOMRA 20 MG PO TABS: 20.0000 mg | ORAL_TABLET | Freq: Every evening | ORAL | 0 refills | Status: DC | PRN

## 2020-05-04 NOTE — Patient Instructions (Signed)

## 2020-05-04 NOTE — Progress Notes (Signed)
1. History of dysplastic nevus Objective  Right Mid Back: Incision site is clean, dry and intact   Encounter for Removal of Sutures - Incision site at the right mid back is clean, dry and intact - Wound cleansed, sutures removed, wound cleansed and steri strips applied.  - Discussed pathology results showing no residual dysplastic nevus, margins free  - Patient advised to keep steri-strips dry until they fall off. - Scars remodel for a full year. - Once steri-strips fall off, patient can apply over-the-counter silicone scar cream each night to help with scar remodeling if desired. - Patient advised to call with any concerns or if they notice any new or changing lesions.

## 2020-05-04 NOTE — Telephone Encounter (Signed)
Pt stated she would like you to send a Rx for 20 mg.She says she has trouble sleeping since her mom passed away.She said she will decrease the dose gradually

## 2020-05-04 NOTE — Telephone Encounter (Signed)
-----   Message from Brendolyn Patty, MD sent at 04/29/2020 12:21 PM EST ----- Skin (M), right mid back NO RESIDUAL DYSPLASTIC NEVUS, MARGINS FREE

## 2020-05-04 NOTE — Telephone Encounter (Signed)
Prescription was sent

## 2020-05-04 NOTE — Telephone Encounter (Signed)
Results discussed at suture removal

## 2020-05-09 DIAGNOSIS — K5732 Diverticulitis of large intestine without perforation or abscess without bleeding: Secondary | ICD-10-CM | POA: Diagnosis not present

## 2020-05-09 DIAGNOSIS — K5792 Diverticulitis of intestine, part unspecified, without perforation or abscess without bleeding: Secondary | ICD-10-CM | POA: Diagnosis not present

## 2020-05-10 DIAGNOSIS — H8110 Benign paroxysmal vertigo, unspecified ear: Secondary | ICD-10-CM | POA: Diagnosis not present

## 2020-05-11 DIAGNOSIS — K5792 Diverticulitis of intestine, part unspecified, without perforation or abscess without bleeding: Secondary | ICD-10-CM | POA: Diagnosis not present

## 2020-05-11 DIAGNOSIS — K5732 Diverticulitis of large intestine without perforation or abscess without bleeding: Secondary | ICD-10-CM | POA: Diagnosis not present

## 2020-05-13 DIAGNOSIS — G4730 Sleep apnea, unspecified: Secondary | ICD-10-CM | POA: Diagnosis not present

## 2020-05-13 DIAGNOSIS — R7309 Other abnormal glucose: Secondary | ICD-10-CM | POA: Diagnosis not present

## 2020-05-13 DIAGNOSIS — N959 Unspecified menopausal and perimenopausal disorder: Secondary | ICD-10-CM | POA: Diagnosis not present

## 2020-05-13 DIAGNOSIS — E039 Hypothyroidism, unspecified: Secondary | ICD-10-CM | POA: Diagnosis not present

## 2020-05-13 DIAGNOSIS — R5381 Other malaise: Secondary | ICD-10-CM | POA: Diagnosis not present

## 2020-05-16 ENCOUNTER — Other Ambulatory Visit: Payer: Self-pay | Admitting: Physician Assistant

## 2020-05-16 NOTE — Telephone Encounter (Signed)
Please schedule appt

## 2020-05-16 NOTE — Telephone Encounter (Signed)
She has a F/U on 06/20/20 with Helene Kelp

## 2020-05-24 NOTE — Patient Instructions (Signed)
VESTIBULAR AND BALANCE EVALUATION   HISTORY:  Subjective history of current problem: Description of dizziness: (vertigo, unsteadiness, lightheadedness, falling, general unsteadiness, whoozy, swimmy-headed sensation, aural fullness) Frequency:  Duration: Symptom nature: (motion provoked, positional, spontaneous, constant, variable, intermittent)   Provocative Factors: Easing Factors:  Progression of symptoms: (better, worse, no change since onset) History of similar episodes:  Falls (yes/no): Number of falls in past 6 months:   Prior Functional Level:   Auditory complaints (tinnitus, pain, drainage, hearing loss, aural fullness): Vision (diplopia, visual field loss, recent changes, last eye exam):  Red Flags: (dysarthria, dysphagia, drop attacks, bowel and bladder changes, recent weight loss/gain) Review of systems negative for red flags.     EXAMINATION  POSTURE:   NEUROLOGICAL SCREEN: (2+ unless otherwise noted.) N=normal  Ab=abnormal  Level Dermatome R L Myotome R L Reflex R L  C3 Anterior Neck N N Sidebend C2-3 N N Jaw CN V    C4 Top of Shoulder N N Shoulder Shrug C4 N N Hoffman's UMN    C5 Lateral Upper Arm N N Shoulder ABD C4-5 N N Biceps C5-6    C6 Lateral Arm/ Thumb N N Arm Flex/ Wrist Ext C5-6 N N Brachiorad. C5-6    C7 Middle Finger N N Arm Ext//Wrist Flex C6-7 N N Triceps C7    C8 4th & 5th Finger N N Flex/ Ext Carpi Ulnaris C8 N N Patellar (L3-4)    T1 Medial Arm N N Interossei T1 N N Gastrocnemius    L2 Medial thigh/groin N N Illiopsoas (L2-3) N N     L3 Lower thigh/med.knee N N Quadriceps (L3-4) N N     L4 Medial leg/lat thigh N N Tibialis Ant (L4-5) N N     L5 Lat. leg & dorsal foot N N EHL (L5) N N     S1 post/lat foot/thigh/leg N N Gastrocnemius (S1-2) N N     S2 Post./med. thigh & leg N N Hamstrings (L4-S3) N N       Cranial Nerves Visual acuity and visual fields are intact  Extraocular muscles are intact  Facial sensation is intact bilaterally   Facial strength is intact bilaterally  Hearing is normal as tested by gross conversation Palate elevates midline, normal phonation  Shoulder shrug strength is intact  Tongue protrudes midline    SOMATOSENSORY:         Sensation           Intact      Diminished         Absent  Light touch       COORDINATION: Finger to Nose: Normal Heel to Shin: Normal Pronator Drift: Negative Rapid Alternating Movements: Normal Finger to Thumb Opposition: Normal  MUSCULOSKELETAL SCREEN: Cervical Spine ROM: WFL and painless in all planes. No gross deficits identified   ROM:   MMT:   Functional Mobility:  Gait: Scanning of visual environment with gait is:    POSTURAL CONTROL TESTS:   Clinical Test of Sensory Interaction for Balance    (CTSIB):  CONDITION TIME STRATEGY SWAY  Eyes open, firm surface 30 seconds ankle   Eyes closed, firm surface 30 seconds ankle   Eyes open, foam surface 30 seconds ankle   Eyes closed, foam surface 30 seconds ankle     OCULOMOTOR / VESTIBULAR TESTING:  Oculomotor Exam- Room Light  Findings Comments  Ocular Alignment {normal/abnormal/not examined:14677}   Ocular ROM {normal/abnormal/not examined:14677}   Spontaneous Nystagmus {normal/abnormal/not examined:14677}   Gaze-Holding Nystagmus {normal/abnormal/not examined:14677}  End-Gaze Nystagmus {normal/abnormal/not examined:14677}   Vergence (normal 2-3") {normal/abnormal/not examined:14677}   Smooth Pursuit {normal/abnormal/not examined:14677}   Cross-Cover Test {normal/abnormal/not examined:14677}   Saccades {normal/abnormal/not examined:14677}   VOR Cancellation {normal/abnormal/not examined:14677}   Left Head Impulse {normal/abnormal/not examined:14677}   Right Head Impulse {normal/abnormal/not examined:14677}   Static Acuity {normal/abnormal/not examined:14677}   Dynamic Acuity {normal/abnormal/not examined:14677}     Oculomotor Exam- Fixation Suppressed  Findings Comments  Ocular  Alignment {normal/abnormal/not examined:14677}   Spontaneous Nystagmus {normal/abnormal/not examined:14677}   Gaze-Holding Nystagmus {normal/abnormal/not examined:14677}   End-Gaze Nystagmus {normal/abnormal/not examined:14677}   Head Shaking Nystagmus {normal/abnormal/not examined:14677}   Pressure-Induced Nystagmus {normal/abnormal/not examined:14677}   Hyperventilation Induced Nystagmus {normal/abnormal/not examined:14677}   Skull Vibration Induced Nystagmus {normal/abnormal/not examined:14677}     BPPV TESTS:  Symptoms Duration Intensity Nystagmus  L Dix-Hallpike None   None  R Dix-Hallpike None   None  L Head Roll None   None  R Head Roll None   None  L Sidelying Test      R Sidelying Test        FUNCTIONAL OUTCOME MEASURES   Results Comments  BERG /56 Fall risk, in need of intervention  DGI /24   FGA /30   TUG seconds   5TSTS seconds   6 Minute Walk Test    10 Meter Gait Speed Self-selected: s = m/s; Fastest: s = m/s Below normative values for full community ambulation  ABC Scale %   DHI /100     ASSESSMENT Clinical Impression: Pt is a pleasant year-old female/female referred for difficulty with baalance. PT examination reveals deficits . Pt presents with deficits in strength, gait and balance. Pt will benefit from skilled PT services to address deficits in balance and decrease risk for future falls.   Low (stable): no personal factors/comorbidities, 1-2 body systems/activity limitations/participation restrictions   Moderate (evolving): 1-2 personal factors/comorbidities, 3 or more body systems/activity limitations/participation restrictions   High (unstable): 3 or more personal factors/comorbidities, 4 or more body systems/activity limitations/participation restrictions    PLAN Next Visit: HEP:    Pt will be independent with HEP in order to improve strength and balance in order to decrease fall risk and improve function at home and work.   Pt will improve BERG by  at least 3 points in order to demonstrate clinically significant improvement in balance.    Pt will improve DGI by at least 3 points in order to demonstrate clinically significant improvement in balance and decreased risk for falls.  Pt will improve ABC by at least 13% in order to demonstrate clinically significant improvement in balance confidence.   Pt will decrease 5TSTS by at least 3 seconds in order to demonstrate clinically significant improvement in LE strength.  Pt will decrease TUG to below 14 seconds/decrease in order to demonstrate decreased fall risk.  Pt will decrease DHI score by at least 18 points in order to demonstrate clinically significant reduction in disability   Pt will increase 6MWT by at least 75m (146ft) in order to demonstrate clinically significant improvement in cardiopulmonary endurance and community ambulation

## 2020-05-25 ENCOUNTER — Ambulatory Visit: Payer: PPO | Attending: Physical Medicine and Rehabilitation

## 2020-05-25 ENCOUNTER — Telehealth: Payer: Self-pay | Admitting: Physician Assistant

## 2020-05-25 ENCOUNTER — Other Ambulatory Visit: Payer: Self-pay

## 2020-05-25 DIAGNOSIS — R42 Dizziness and giddiness: Secondary | ICD-10-CM

## 2020-05-25 DIAGNOSIS — R2681 Unsteadiness on feet: Secondary | ICD-10-CM

## 2020-05-25 NOTE — Telephone Encounter (Signed)
Katherine James called in today with concerns about vision. She states that she thinks her worsening vision is due to the Lithium 150mg  she is currently taking. States vision has gotten significantly worse and other providers have advised her to talk to [facility]. Would like to know if she could possibly be seen before appt 4/25. I have added her to cancellation list. She also has concerns about Kevatin states she hasn't started yet and has some questions. She would like a CB ASAP. [phone]

## 2020-05-25 NOTE — Telephone Encounter (Signed)
Pt stated she has problems with her eyes jerking up and down.She went to her doctor and a inner ear issue has been ruled out.She has trouble driving,reading and walking straight and believes this comes from her lithium.She also stated that she is currently getting ketamine infusions but is interested in receiving ketamine that goes in your nose,I believe she is referring to spravato.
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
## 2020-05-26 ENCOUNTER — Ambulatory Visit (INDEPENDENT_AMBULATORY_CARE_PROVIDER_SITE_OTHER): Payer: PPO | Admitting: Addiction (Substance Use Disorder)

## 2020-05-26 ENCOUNTER — Telehealth: Payer: Self-pay | Admitting: Physician Assistant

## 2020-05-26 DIAGNOSIS — B351 Tinea unguium: Secondary | ICD-10-CM | POA: Diagnosis not present

## 2020-05-26 DIAGNOSIS — M79671 Pain in right foot: Secondary | ICD-10-CM | POA: Diagnosis not present

## 2020-05-26 DIAGNOSIS — L600 Ingrowing nail: Secondary | ICD-10-CM | POA: Diagnosis not present

## 2020-05-26 DIAGNOSIS — F319 Bipolar disorder, unspecified: Secondary | ICD-10-CM | POA: Diagnosis not present

## 2020-05-26 DIAGNOSIS — M79672 Pain in left foot: Secondary | ICD-10-CM | POA: Diagnosis not present

## 2020-05-26 NOTE — Telephone Encounter (Signed)
Will need to order a lithium level. Please see where she wants me to order it, [facility] or [facility]? Thanks

## 2020-05-26 NOTE — Telephone Encounter (Signed)
She informed me that she has had a lithium level done within the past 6 weeks but if you still need another she wants to use the [facility] in [place] on [address]

## 2020-05-26 NOTE — Progress Notes (Signed)
      [facility] Counselor/Therapist Progress Note  Patient ID: Katherine James, MRN: [id number],    Date: 05/26/2020  Time Spent: 53mins  Treatment Type: Individual Therapy  Reported Symptoms: foul moods related to grief, but improvement with depression.   Mental Status Exam:  Appearance:   Neat     Behavior:  Sharing  Motor:  Restlestness and Tremor  Speech/Language:   Clear and Coherent and Normal Rate  Affect:  Congruent, Labile and Tearful  Mood:  anxious and labile  Thought process:  circumstantial and flight of ideas  Thought content:    Obsessions and Rumination  Sensory/Perceptual disturbances:    Flashback  Orientation:  x4  Attention:  Fair  Concentration:  Good  Memory:  impaired  Fund of knowledge:   Fair  Insight:    Fair  Judgment:   Fair  Impulse Control:  Good   Risk Assessment: Danger to Self:  No Self-injurious Behavior: No Danger to Others: No Duty to Warn:no Physical Aggression / Violence:No  Access to Firearms a concern: No  Gang Involvement:No   Subjective: Client processed dealing with a few foul moods related to grief, but improvement with depressive episodes due to ketamine treatments. Client described some adjusting to her health conditions and finding hope and peace surrounding the struggles shes having. Therapist used MI to support her in making steps towards her goals of finding healing from past traumas and health conditions. Client most fearful of abandonment by friends and family. Client always struggled with intrusive thoughts about getting news about lost her siblings. Client attempting to make repairs with her sister in a way that's not desperate or disregulated. Therapist assessed for safety and client denied SI/HI/AVH  Interventions: Motivational Interviewing  Diagnosis:   ICD-10-CM   1. Bipolar I disorder ([place])  F31.9    Plan of Care: Client to return for weekly therapy with Sammuel Cooper, therapist, to review again in 6  months. Client is to continue seeing medication provider for support of mood management.   Client to engage in CBT: challenging negative internal ruminationsand self-talk AEB journaling daily or expressing thoughts to support persons in their life and then challenging it with truth.  Client to engage in tapping to tap in healthy cognition that challenges negative rumination or deep negative core belief.  Client to practice DBT distress tolerance skills to decrease crying spells and thoughts of not being able to endure their suffering AEB using mindfulness, deep breathing, and TIP to increase tolerance for discomfort, discharge emotional distress, and increase their understanding that they can do hard things.  Client to utilize BSP (brainspotting) with therapist to help client identify and process triggers for their depressive episode and anxiety with goal of reducing said SUDs caused by depression/anxiety by 33% in the next 6 months. Client to prioritize sleep 8+ hours each week night AEB going to bed by 10pm each night.  Barnie Del, LCSW, LCAS, CCTP, CCS-I, BSP

## 2020-05-26 NOTE — Telephone Encounter (Signed)
[name] called and is a physical therapist with [facility]. He saw Katherine James yesterday and he would like to talk to you about some of his concerns. Please give him a call at [phone]. This number is his office he said have the receptionist page him.

## 2020-05-27 NOTE — Telephone Encounter (Signed)
Returned call to [employer], left message on office voicemail asking him to call me back about this patient.

## 2020-05-30 ENCOUNTER — Other Ambulatory Visit: Payer: Self-pay

## 2020-05-30 ENCOUNTER — Ambulatory Visit: Payer: PPO

## 2020-05-30 ENCOUNTER — Encounter: Payer: Self-pay | Admitting: Physician Assistant

## 2020-05-30 ENCOUNTER — Ambulatory Visit (INDEPENDENT_AMBULATORY_CARE_PROVIDER_SITE_OTHER): Payer: PPO | Admitting: Physician Assistant

## 2020-05-30 ENCOUNTER — Telehealth: Payer: Self-pay | Admitting: Physician Assistant

## 2020-05-30 DIAGNOSIS — M7542 Impingement syndrome of left shoulder: Secondary | ICD-10-CM | POA: Diagnosis not present

## 2020-05-30 DIAGNOSIS — F900 Attention-deficit hyperactivity disorder, predominantly inattentive type: Secondary | ICD-10-CM | POA: Diagnosis not present

## 2020-05-30 DIAGNOSIS — H5500 Unspecified nystagmus: Secondary | ICD-10-CM

## 2020-05-30 DIAGNOSIS — F319 Bipolar disorder, unspecified: Secondary | ICD-10-CM | POA: Diagnosis not present

## 2020-05-30 DIAGNOSIS — F4310 Post-traumatic stress disorder, unspecified: Secondary | ICD-10-CM | POA: Diagnosis not present

## 2020-05-30 DIAGNOSIS — Z79899 Other long term (current) drug therapy: Secondary | ICD-10-CM | POA: Diagnosis not present

## 2020-05-30 DIAGNOSIS — F4321 Adjustment disorder with depressed mood: Secondary | ICD-10-CM | POA: Diagnosis not present

## 2020-05-30 DIAGNOSIS — G4700 Insomnia, unspecified: Secondary | ICD-10-CM

## 2020-05-30 MED ORDER — BELSOMRA 20 MG PO TABS: 20.0000 mg | ORAL_TABLET | Freq: Every evening | ORAL | 0 refills | Status: DC | PRN

## 2020-05-30 NOTE — Telephone Encounter (Signed)
Traci and I discussed this.

## 2020-05-30 NOTE — Telephone Encounter (Signed)
Discussed with patient at her appointment this morning.

## 2020-05-30 NOTE — Progress Notes (Signed)
[facility] Med Check  Patient ID: Katherine James,  MRN: [id number]  PCP: Melany Guernsey  Date of Evaluation: 05/30/2020 Time spent:45 minutes  Chief Complaint:  Chief Complaint    Depression      HISTORY/CURRENT STATUS: HPI For routine med check.  Trouble with vision. Has had Nystagmus for about 1-1/2 years now.  Has been through several different test, all have been negative.  C results and consults on chart.  She has been sent to physical therapy to see if there was some vestibular issue causing the nystagmus.  Her therapist Roxana Hires does not feel this has anything to do with in her ear.  He called this morning and updated me on everything that is been going on with her.  A neurology consult is pending.   As far as her mental health goes, she is still extremely depressed.  She has been paying out-of-pocket for weekly IV ketamine.  She has heard there is another treatment and asks about that.  She is referring to Spravato.  She is still grieving the death of her mom.  It is been really rough.  She has no energy or motivation.  She hurts on a daily basis and is on opiates.  Does not really enjoy things.  Does not sleep well.  She cries easily.  She does not work outside the home.  Denies suicidal or homicidal thoughts.  Patient denies increased energy with decreased need for sleep, no increased talkativeness, no racing thoughts, no impulsivity or risky behaviors, no increased spending, no increased libido, no grandiosity, no increased irritability or anger, no paranoia, and no hallucinations.  Denies dizziness, syncope, seizures, numbness, tingling, tremor, tics, unsteady gait, slurred speech, confusion. Denies muscle or joint pain, stiffness, or dystonia.  Individual Medical History/ Review of Systems: Changes? :Yes See HPI.  Allergies: Alprazolam, Amoxicillin-pot clavulanate, Celecoxib, Chlorpromazine hcl, Codeine, Depakote [divalproex sodium], Desipramine, Diclofenac sodium,  Doxycycline, Etodolac, Fluoxetine hcl, Fluticasone propionate, Ibuprofen, Influenza vaccines, Iodine, Ketorolac tromethamine, Levofloxacin, Meperidine hcl, Nabumetone, Naproxen, Neurontin [gabapentin], Nsaids, Olive oil, Oxaprozin, Paroxetine, Phenergan [promethazine hcl], Piroxicam, Pneumococcal vaccines, Rofecoxib, Skelaxin [metaxalone], Sulfamethoxazole-trimethoprim, Sulfasalazine, Sulindac, Symbicort [budesonide-formoterol fumarate], Terfenadine, Tetracycline, Tramadol hcl, Wellbutrin [bupropion], Zinc, and Lisinopril  Current Medications:  Current Outpatient Medications:  .  carisoprodol (SOMA) 350 MG tablet, Take 3 tablets daily, Disp: , Rfl:  .  lamoTRIgine (LAMICTAL) 200 MG tablet, TAKE ONE AND ONE-HALF TABLETS BY MOUTH DAILY, Disp: 135 tablet, Rfl: 1 .  levothyroxine (SYNTHROID) 50 MCG tablet, Take 50 mcg by mouth daily before breakfast., Disp: , Rfl:  .  lithium carbonate 150 MG capsule, TAKE 1 CAPSULE BY MOUTH AT BEDTIME (TAKEWITH 900MG LITHIUM TO EQUAL 1050MG/DAY), Disp: 90 capsule, Rfl: 0 .  lithium carbonate 300 MG capsule, TAKE 3 CAPSULES BY MOUTH AT BEDTIME. TAKE WITH 150MG CAPSULE, Disp: 270 capsule, Rfl: 0 .  LORazepam (ATIVAN) 0.5 MG tablet, TAKE 1/2 TO 1 TABLET BY MOUTH TWICE A DAY AS NEEDED FOR ANXIETY, Disp: 30 tablet, Rfl: 5 .  ondansetron (ZOFRAN) 4 MG tablet, TAKE 1 AND 1/2 TABLETS BY MOUTH DAILY FOR NAUSEA, Disp: 45 tablet, Rfl: 0 .  oxyCODONE (OXY IR/ROXICODONE) 5 MG immediate release tablet, Take 10 mg daily as needed by mouth for severe pain or breakthrough pain. , Disp: , Rfl:  .  pregabalin (LYRICA) 25 MG capsule, Take 25 mg by mouth 2 (two) times daily. , Disp: , Rfl:  .  Pregnenolone Micronized (PREGNENOLONE PO), Take by mouth., Disp: , Rfl:  .  progesterone (PROMETRIUM)  200 MG capsule, Take 200 mg by mouth daily., Disp: , Rfl:  .  UNABLE TO FIND, DHEA SR (compound)- take 17 mg (1 tablet) daily except Saturday and Sunday, Disp: , Rfl:  .  UNABLE TO FIND, Hormone  Topical Cream (Compound): Bi-Est (8:2) 1 mg, apply 1 gram every morning, Disp: , Rfl:  .  UNABLE TO FIND, Fentenyl Pain Patch, apply one 12 mcg patch every 3 days for chronic pain, Disp: , Rfl:  .  UNABLE TO FIND, Nature's Thryoid- take 3 g M, W and F, and 2 g every other day, Disp: , Rfl:  .  UNABLE TO FIND, Progesterone SR 300 mg (compound), take 1 tablet daily at bedtime, Disp: , Rfl:  .  UNABLE TO FIND, Seroquel (Compound)- 8.3 mg per mL, take 1 mL daily at bedtime, Disp: , Rfl:  .  UNABLE TO FIND, Pregnenolone 90 mg (compound), take 1 tablet daily, Disp: , Rfl:  .  UNABLE TO FIND, Testosterone Cream 0.5%- 1g/13m, Disp: , Rfl:  .  hydrochlorothiazide (HYDRODIURIL) 25 MG tablet, TAKE 1 TABLET BY MOUTH ONCE DAILY. (Patient not taking: Reported on 05/30/2020), Disp: 30 tablet, Rfl: 1 .  Suvorexant (BELSOMRA) 20 MG TABS, Take 20 mg by mouth at bedtime as needed., Disp: 30 tablet, Rfl: 0 Medication Side Effects: none  Family Medical/ Social History: Changes? No  MENTAL HEALTH EXAM:  There were no vitals taken for this visit.There is no height or weight on file to calculate BMI.  General Appearance: Casual and Well Groomed  Eye Contact:  Normal eye contact but has up/down beating nystagmus  Speech:  Clear and Coherent and Normal Rate  Volume:  Normal  Mood:  Euthymic  Affect:  Appropriate  Thought Process:  Goal Directed and Descriptions of Associations: Intact  Orientation:  Full (Time, Place, and Person)  Thought Content: Logical   Suicidal Thoughts:  No  Homicidal Thoughts:  No  Memory:  WNL  Judgement:  Good  Insight:  Good  Psychomotor Activity:  Normal   Concentration:  Concentration: Good and Attention Span: Good  Recall:  Good  Fund of Knowledge: Good  Language: Good  Assets:  Desire for Improvement  ADL's:  Intact  Cognition: WNL  Prognosis:  Good   Most recent labs relative for psychiatric medications (see chart for full results.) 03/24/2020 TSH 0.232 Lithium level  0.7 05/09/2020 Glucose 104, alk phos was 158, BUN 17, creatinine 0.73  MRI of the brain and orbits 02/24/2020 showed mild chronic white matter microangiopathy so no acute issues or pathology to explain her symptoms.  Doppler of carotid arteries 04/21/2020 showed minimal plaque bilaterally,    DIAGNOSES:    ICD-10-CM   1. Bipolar I disorder ([place]  F31.9   2. Encounter for long-term (current) use of medications  Z79.899 Lithium level  3. PTSD (post-traumatic stress disorder)  F43.10   4. Insomnia, unspecified type  G47.00   5. Attention deficit hyperactivity disorder (ADHD), predominantly inattentive type  F90.0   6. Grief  F43.21   7. Nystagmus  H55.00     Receiving Psychotherapy: Yes  JHar[facility]mark  RECOMMENDATIONS:  PDMP was reviewed. I provided 45 minutes of face-to-face time during this encounter including time spent before and after the visit in consultation with her physical therapist, thorough records review of recent labs and imaging studies, and charting. We discussed the nystagmus.  It is possible the lithium is causing it and even though she had a lithium level a few months  ago, it needs to be repeated.  Other labs have been done recently, TSH, CMP so there is no reason for me to order that. I am not going to change anything until I get the lithium level back. We had a very long discussion concerning Spravato.  Benefits, risks, side effects were discussed.  At this time, we are not starting new patients on Spravato but plan to within the next 6 months.  She is aware that Dr. Toy Care, another psychiatrist here in town also gives Spravato.  I have given Vendetta her information and asked her to call their office.  If they can see her and get started on the Spravato sooner than the possibility of starting it, I recommend she have it done there.  She can still see me here and when we are able to except more patients for Spravato she can move her treatment here if she prefers. Sleep hygiene  discussed. Continue Lamictal 200 mg, 1.5 pills daily. Continue Ativan 0.5 mg, 1/2-1 p.o. twice daily as needed. Continue lithium 1050 mg/day (3x300+150 mg caps) Continue Belsomra 20 mg, 1 p.o. nightly as needed. Continue Seroquel 8.3 mg daily, she has compounded. Lithium level ordered.   Return in 2 months.  Donnal Moat, PA-C

## 2020-05-30 NOTE — Telephone Encounter (Signed)
Spoke with [name] this morning before patient's appointment.  See progress note.

## 2020-05-30 NOTE — Telephone Encounter (Signed)
Libby was just seen by Helene Kelp today. She is trying to get on the Spravato injections. Helene Kelp needs her to start Ketamine nasal spray for this. Per Vinnie Level needs to fill out a referral form to see [facility]. Per Shaylinn go to their web site and hit referrals and then forms. Referral needs completed and then sent to Dr. Toy Care. They won't see her until this is completed.

## 2020-05-30 NOTE — Telephone Encounter (Signed)
Please review

## 2020-05-30 NOTE — Telephone Encounter (Signed)
I will discuss with Katherine James. Thanks Vivien Rota

## 2020-06-06 ENCOUNTER — Ambulatory Visit: Payer: PPO

## 2020-06-06 DIAGNOSIS — Z79899 Other long term (current) drug therapy: Secondary | ICD-10-CM | POA: Diagnosis not present

## 2020-06-07 ENCOUNTER — Telehealth: Payer: Self-pay | Admitting: Physician Assistant

## 2020-06-07 ENCOUNTER — Ambulatory Visit (INDEPENDENT_AMBULATORY_CARE_PROVIDER_SITE_OTHER): Payer: PPO | Admitting: Addiction (Substance Use Disorder)

## 2020-06-07 ENCOUNTER — Other Ambulatory Visit: Payer: Self-pay

## 2020-06-07 ENCOUNTER — Ambulatory Visit: Payer: PPO | Admitting: Addiction (Substance Use Disorder)

## 2020-06-07 DIAGNOSIS — F603 Borderline personality disorder: Secondary | ICD-10-CM

## 2020-06-07 DIAGNOSIS — F319 Bipolar disorder, unspecified: Secondary | ICD-10-CM | POA: Diagnosis not present

## 2020-06-07 LAB — LITHIUM LEVEL: Lithium Lvl: 0.9 mmol/L (ref 0.5–1.2)

## 2020-06-07 NOTE — Progress Notes (Signed)
I'm not stopping the Lithium, if that's what she's worried about, just going down by 150 mg. Purpose is to see if a lower dose decreases the abnormal eye movements. She has Lithium capsules so can't be split. If she feels more comfortable on the 1,050 mg/day, have her stay on that, lab result is good so no reason to change doses as far as that goes.  Traci, have we received a referral form to send Dr. Toy Care for Spravato tx there? Or tx here?

## 2020-06-07 NOTE — Progress Notes (Signed)
Pt stated last time she decreased the lithium it caused a severe bipolar episode.She does not want to decrease completely and is requesting she take 100 mg instead of getting rid of it completely.She also mentioned you were suppose to fill out a referral for Dr. Wylene Simmer for spravato treatment.She will drop this off today

## 2020-06-07 NOTE — Progress Notes (Signed)
Katherine James please call her, Nicoletta Dress level is good at 0.9. Decrease the Li by stopping the 150 mg pill, so take 900 mg only.
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
## 2020-06-07 NOTE — Telephone Encounter (Signed)
Received Spravato Referral Form and Requirements given to [facility] 06/07/20

## 2020-06-08 ENCOUNTER — Telehealth: Payer: Self-pay | Admitting: Physician Assistant

## 2020-06-08 ENCOUNTER — Other Ambulatory Visit: Payer: Self-pay

## 2020-06-08 MED ORDER — LITHIUM CARBONATE 300 MG PO TABS: ORAL_TABLET | ORAL | 0 refills | Status: DC

## 2020-06-08 NOTE — Telephone Encounter (Signed)
Spoke with pt and discussed options on her Lithium, decision is to reduce by 75 mg. She is going to 1/4 tablet of 300 mg. She wants to reduce slowly she knows how her body is. Confirmed with her that Helene Kelp is aware of hesitation and will agree.   Rx for Lithium 300 mg tablet submitted to her pharmacy.    Informed her that the paperwork is being worked on and will get faxed as soon as possible.

## 2020-06-08 NOTE — Telephone Encounter (Signed)
Pt called and said that she wants to know is it ok for her to decrease the lithium from 50 mg to instead of  150 mg. Please call her with an answer at [phone]. She was also checking on the referral for dr. Toy Care and spravato

## 2020-06-09 NOTE — Telephone Encounter (Signed)
Noted  

## 2020-06-14 ENCOUNTER — Ambulatory Visit: Payer: PPO | Admitting: Addiction (Substance Use Disorder)

## 2020-06-15 NOTE — Telephone Encounter (Signed)
I'm pretty sure I signed this already. Would one of you check to see if it was faxed to Dr. Toy Care? Thanks

## 2020-06-15 NOTE — Telephone Encounter (Signed)
Has the forms been completed and faxed ?

## 2020-06-16 NOTE — Telephone Encounter (Signed)
Faxed to Dr. Toy Care today

## 2020-06-16 NOTE — Telephone Encounter (Signed)
Reviewed and returned to [place] around 12:45 pm.

## 2020-06-16 NOTE — Telephone Encounter (Signed)
I believe Katherine James faxed it

## 2020-06-16 NOTE — Telephone Encounter (Signed)
This is in Katherine James's box to review as of 4/20. You need to review previous meds tried and failed. It is in Katherine James's box and we will fax when that is reviewed.

## 2020-06-20 ENCOUNTER — Other Ambulatory Visit: Payer: Self-pay | Admitting: Physician Assistant

## 2020-06-20 ENCOUNTER — Ambulatory Visit: Payer: PPO | Admitting: Physician Assistant

## 2020-06-21 ENCOUNTER — Ambulatory Visit (INDEPENDENT_AMBULATORY_CARE_PROVIDER_SITE_OTHER): Payer: PPO | Admitting: Addiction (Substance Use Disorder)

## 2020-06-21 ENCOUNTER — Other Ambulatory Visit: Payer: Self-pay | Admitting: Physician Assistant

## 2020-06-21 ENCOUNTER — Telehealth: Payer: Self-pay | Admitting: Physician Assistant

## 2020-06-21 ENCOUNTER — Other Ambulatory Visit: Payer: Self-pay

## 2020-06-21 DIAGNOSIS — F603 Borderline personality disorder: Secondary | ICD-10-CM

## 2020-06-21 DIAGNOSIS — F319 Bipolar disorder, unspecified: Secondary | ICD-10-CM | POA: Diagnosis not present

## 2020-06-21 MED ORDER — LITHIUM CARBONATE 150 MG PO CAPS: ORAL_CAPSULE | ORAL | 0 refills | Status: DC

## 2020-06-21 NOTE — Telephone Encounter (Signed)
She does not have to decrease the lithium, as her last lithium level was within a good range. Prescription for lithium 150 mg was sent to her pharmacy.  She should continue 3 pills of the 300 mg with this to equal 1050 mg daily

## 2020-06-21 NOTE — Telephone Encounter (Signed)
Katherine James was in the office today with refill request for lithium. States that pharmacy called. She also mentioned that she is not ready to reduce lithium at this time. Appt 6/10. [facility] [place]

## 2020-06-21 NOTE — Progress Notes (Signed)
      [facility] Counselor/Therapist Progress Note  Patient ID: Katherine James, MRN: [id number],    Date: 06/21/2020  Time Spent: 108mins  Treatment Type: Individual Therapy  Reported Symptoms: hopelessness, fearful.  Mental Status Exam:  Appearance:   Casual and Fairly Groomed     Behavior:  Sharing  Motor:  Normal  Speech/Language:   Clear and Coherent and Normal Rate  Affect:  Congruent and Labile  Mood:  anxious, irritable and labile  Thought process:  normal  Thought content:    Obsessions and Rumination  Sensory/Perceptual disturbances:    Flashback  Orientation:  x4  Attention:  Fair  Concentration:  Good  Memory:  impaired  Fund of knowledge:   Fair  Insight:    Poor  Judgment:   Fair  Impulse Control:  Good   Risk Assessment: Danger to Self:  No Self-injurious Behavior: No Danger to Others: No Duty to Warn:no Physical Aggression / Violence:No  Access to Firearms a concern: No  Gang Involvement:No   Subjective: Client dealing with ongoing dizziness and vision issues. Client storytelling about all crises she continues to endure. Client feeling like she cant stabilize and ground due to feeling unwound emotionally following a fall and shake (almost seizure-like). Client processed a worsening of her physical condition as of 1.5 years ago during the time she was living with her mom to care for her before she died. Therapist used MI to listen unconditionally and SFT to help her work through her thoughts and ways she could find some relief. Therapist used mindfulness as a way to calm her fears and CBT to challenge her irrational thoughts. Therapist assessed for safety and client denied SI/HI/AVH  Interventions: Cognitive Behavioral Therapy, Mindfulness Meditation, Motivational Interviewing, Solution-Oriented/Positive Psychology and RPT  Diagnosis:   ICD-10-CM   1. Bipolar I disorder ([place])  F31.9   2. Borderline personality disorder ([place])  F60.3     Plan of  Care: Client to return for weekly therapy with Sammuel Cooper, therapist, to review again in 6 months. Client is to continue seeing medication provider for support of mood management.   Client to engage in CBT: challenging negative internal ruminationsand self-talk AEB journaling daily or expressing thoughts to support persons in their life and then challenging it with truth.  Client to engage in tapping to tap in healthy cognition that challenges negative rumination or deep negative core belief.  Client to practice DBT distress tolerance skills to decrease crying spells and thoughts of not being able to endure their suffering AEB using mindfulness, deep breathing, and TIP to increase tolerance for discomfort, discharge emotional distress, and increase their understanding that they can do hard things.  Client to utilize BSP (brainspotting) with therapist to help client identify and process triggers for their depressive episode and anxiety with goal of reducing said SUDs caused by depression/anxiety by 33% in the next 6 months. Client to prioritize sleep 8+ hours each week night AEB going to bed by 10pm each night.  Barnie Del, LCSW, LCAS, CCTP, CCS-I, BSP

## 2020-06-21 NOTE — Telephone Encounter (Signed)
Please review and let me know what I should send

## 2020-06-28 ENCOUNTER — Ambulatory Visit: Payer: PPO | Admitting: Addiction (Substance Use Disorder)

## 2020-07-05 ENCOUNTER — Ambulatory Visit: Payer: PPO | Admitting: Physician Assistant

## 2020-07-05 ENCOUNTER — Ambulatory Visit (INDEPENDENT_AMBULATORY_CARE_PROVIDER_SITE_OTHER): Payer: PPO | Admitting: Addiction (Substance Use Disorder)

## 2020-07-05 DIAGNOSIS — F319 Bipolar disorder, unspecified: Secondary | ICD-10-CM

## 2020-07-05 DIAGNOSIS — F603 Borderline personality disorder: Secondary | ICD-10-CM

## 2020-07-05 DIAGNOSIS — F1120 Opioid dependence, uncomplicated: Secondary | ICD-10-CM | POA: Diagnosis not present

## 2020-07-05 DIAGNOSIS — Z79891 Long term (current) use of opiate analgesic: Secondary | ICD-10-CM | POA: Diagnosis not present

## 2020-07-05 NOTE — Progress Notes (Signed)
[facility] Counselor/Therapist Progress Note  Patient ID: Katherine James, MRN: [id number],    Date: 07/05/2020  Time Spent: 1mins  Treatment Type: Individual Therapy  Reported Symptoms: less grief less empty feelings  Mental Status Exam:  Appearance:   Casual and Fairly Groomed     Behavior:  Sharing  Motor:  Normal  Speech/Language:   Clear and Coherent and Normal Rate  Affect:  Congruent and Labile  Mood:  depressed, irritable and labile  Thought process:  normal  Thought content:    Obsessions and Rumination  Sensory/Perceptual disturbances:    Flashback  Orientation:  x4  Attention:  Fair  Concentration:  Good  Memory:  impaired  Fund of knowledge:   Fair  Insight:    Fair  Judgment:   Fair  Impulse Control:  Good   Risk Assessment: Danger to Self:  No Self-injurious Behavior: No Danger to Others: No Duty to Warn:no Physical Aggression / Violence:No  Access to Firearms a concern: No  Gang Involvement:No    Virtual Visit via VIDEO: I connected with client by alternative video enabled telemedicine/telehealth application, with their informed consent, and verified client privacy and that I am speaking with the correct person using two identifiers. I discussed the limitations, risks, security and privacy concerns of performing psychotherapy and management service virtually and confirmed their location. I also discussed with the patient that there may be a patient responsible charge related to this service and to confirm with the front desk if their insurance covers teletherapy. I also discussed with the patient the availability of in person appointments. The patient expressed understanding and agreed to proceed. I discussed the treatment planning with the client. The client was provided an opportunity to ask questions and all were answered. The client agreed with the plan and demonstrated an understanding of the instructions. The client was advised to call our office  if symptoms worsen or feel they are in a crisis state and need immediate contact. Client also reminded of a crisis line number and to use 9-1-1 if there's an emergency.  Therapist Location: office; Client Location: home.  Subjective: Client dealing with less grief less and less numbness/empty feelings. Client processed the depression medicine and how its treating her depression. Client feeling like she needs to sigh out and relax but has a feeling that she has to keep fighting for information about her health and to help her find a way out of the depression. Client processed how some of her depression treatments cause her to hallucinate and following them, she feels more relieved from the loss of her mother. Client processed the family dynamics and issues she has with relationships such as with her siblings and also with her friends, where she feels ignored/judged/etc. Therapist used ego & Mi to support client in processing her feelings and history. Therapist assessed for safety and client denied SI/HI/AVH  Interventions: Motivational Interviewing, Ego-Supportive and RPT  Diagnosis:   ICD-10-CM   1. Borderline personality disorder ([place])  F60.3   2. Bipolar I disorder ([place])  F31.9     Plan of Care: Client to return for weekly therapy with Sammuel Cooper, therapist, to review again in 6 months. Client is to continue seeing medication provider for support of mood management.   Client to engage in CBT: challenging negative internal ruminationsand self-talk AEB journaling daily or expressing thoughts to support persons in their life and then challenging it with truth.  Client to engage in tapping to tap in healthy  cognition that challenges negative rumination or deep negative core belief.  Client to practice DBT distress tolerance skills to decrease crying spells and thoughts of not being able to endure their suffering AEB using mindfulness, deep breathing, and TIP to increase tolerance for discomfort,  discharge emotional distress, and increase their understanding that they can do hard things.  Client to utilize BSP (brainspotting) with therapist to help client identify and process triggers for their depressive episode and anxiety with goal of reducing said SUDs caused by depression/anxiety by 33% in the next 6 months. Client to prioritize sleep 8+ hours each week night AEB going to bed by 10pm each night.  Barnie Del, LCSW, LCAS, CCTP, CCS-I, BSP

## 2020-07-06 DIAGNOSIS — F1120 Opioid dependence, uncomplicated: Secondary | ICD-10-CM | POA: Diagnosis not present

## 2020-07-06 DIAGNOSIS — M2669 Other specified disorders of temporomandibular joint: Secondary | ICD-10-CM | POA: Diagnosis not present

## 2020-07-06 DIAGNOSIS — G894 Chronic pain syndrome: Secondary | ICD-10-CM | POA: Diagnosis not present

## 2020-07-08 ENCOUNTER — Telehealth: Payer: Self-pay | Admitting: Physician Assistant

## 2020-07-08 DIAGNOSIS — F332 Major depressive disorder, recurrent severe without psychotic features: Secondary | ICD-10-CM | POA: Diagnosis not present

## 2020-07-08 NOTE — Telephone Encounter (Signed)
Katherine James has decreased her lithium by 75mg . Discussed it at last visit. She did this in attempt to see if vision would be better, but they are not. Has had a headache since yesterday. Is that normal to experience?

## 2020-07-08 NOTE — Telephone Encounter (Signed)
Please review

## 2020-07-08 NOTE — Telephone Encounter (Signed)
Lithium does not normally affect vision whether the dose goes up or down, nor headaches.

## 2020-07-11 NOTE — Telephone Encounter (Signed)
noted 

## 2020-07-11 NOTE — Telephone Encounter (Signed)
This is Teresa's pt so I will forward on

## 2020-07-11 NOTE — Telephone Encounter (Signed)
Have not been able to reach her since Friday.LVM

## 2020-07-15 DIAGNOSIS — R4182 Altered mental status, unspecified: Secondary | ICD-10-CM | POA: Diagnosis not present

## 2020-07-15 DIAGNOSIS — R251 Tremor, unspecified: Secondary | ICD-10-CM | POA: Diagnosis not present

## 2020-07-15 DIAGNOSIS — R2681 Unsteadiness on feet: Secondary | ICD-10-CM | POA: Diagnosis not present

## 2020-07-15 DIAGNOSIS — H5500 Unspecified nystagmus: Secondary | ICD-10-CM | POA: Diagnosis not present

## 2020-07-18 DIAGNOSIS — B351 Tinea unguium: Secondary | ICD-10-CM | POA: Diagnosis not present

## 2020-07-18 DIAGNOSIS — R2681 Unsteadiness on feet: Secondary | ICD-10-CM | POA: Diagnosis not present

## 2020-07-18 DIAGNOSIS — M79675 Pain in left toe(s): Secondary | ICD-10-CM | POA: Diagnosis not present

## 2020-07-18 DIAGNOSIS — R4182 Altered mental status, unspecified: Secondary | ICD-10-CM | POA: Diagnosis not present

## 2020-07-18 DIAGNOSIS — R251 Tremor, unspecified: Secondary | ICD-10-CM | POA: Diagnosis not present

## 2020-07-18 DIAGNOSIS — H5500 Unspecified nystagmus: Secondary | ICD-10-CM | POA: Diagnosis not present

## 2020-07-18 DIAGNOSIS — L600 Ingrowing nail: Secondary | ICD-10-CM | POA: Diagnosis not present

## 2020-07-18 DIAGNOSIS — M79674 Pain in right toe(s): Secondary | ICD-10-CM | POA: Diagnosis not present

## 2020-07-21 ENCOUNTER — Ambulatory Visit (INDEPENDENT_AMBULATORY_CARE_PROVIDER_SITE_OTHER): Payer: PPO | Admitting: Addiction (Substance Use Disorder)

## 2020-07-21 ENCOUNTER — Other Ambulatory Visit: Payer: Self-pay

## 2020-07-21 DIAGNOSIS — F603 Borderline personality disorder: Secondary | ICD-10-CM

## 2020-07-21 NOTE — Progress Notes (Signed)
      [facility] Counselor/Therapist Progress Note  Patient ID: Katherine James, MRN: [id number],    Date: 07/21/2020  Time Spent: 73mins  Treatment Type: Individual Therapy  Reported Symptoms: sad  Mental Status Exam:  Appearance:   Casual and Fairly Groomed     Behavior:  Sharing  Motor:  Normal  Speech/Language:   Clear and Coherent and Normal Rate  Affect:  Congruent and Labile  Mood:  depressed and sad  Thought process:  normal  Thought content:    Obsessions and Rumination  Sensory/Perceptual disturbances:    Flashback  Orientation:  x4  Attention:  Fair  Concentration:  Good  Memory:  impaired  Fund of knowledge:   Fair  Insight:    Fair  Judgment:   Fair  Impulse Control:  Good   Risk Assessment: Danger to Self:  No Self-injurious Behavior: No Danger to Others: No Duty to Warn:no Physical Aggression / Violence:No  Access to Firearms a concern: No  Gang Involvement:No   Subjective: Client reported finding out from her neurologist that she had TD and began taking medication to reverse the effects of TD that were causing her so many issues. Client reported looking for the answer, but being even more grieved by the answer that she had to take more meds to reverse the symptoms. Client grieving her medical and mh issues causing her such distress in this life and therapist used MI to support client and validate her pain, joining with her. Therapist also used grief therapy to help client process and look for hope of resolutions for her.  Therapist assessed for safety and client denied SI/HI/AVH  Interventions: Motivational Interviewing, Grief Therapy and RPT  Diagnosis:   ICD-10-CM   1. Borderline personality disorder ([place])  F60.3     Plan of Care: Client to return for weekly therapy with Sammuel Cooper, therapist, to review again in 6 months. Client is to continue seeing medication provider for support of mood management.   Client to engage in CBT: challenging  negative internal ruminationsand self-talk AEB journaling daily or expressing thoughts to support persons in their life and then challenging it with truth.  Client to engage in tapping to tap in healthy cognition that challenges negative rumination or deep negative core belief.  Client to practice DBT distress tolerance skills to decrease crying spells and thoughts of not being able to endure their suffering AEB using mindfulness, deep breathing, and TIP to increase tolerance for discomfort, discharge emotional distress, and increase their understanding that they can do hard things.  Client to utilize BSP (brainspotting) with therapist to help client identify and process triggers for their depressive episode and anxiety with goal of reducing said SUDs caused by depression/anxiety by 33% in the next 6 months. Client to prioritize sleep 8+ hours each week night AEB going to bed by 10pm each night.  Barnie Del, LCSW, LCAS, CCTP, CCS-I, BSP

## 2020-08-01 ENCOUNTER — Ambulatory Visit: Payer: PPO | Admitting: Addiction (Substance Use Disorder)

## 2020-08-02 DIAGNOSIS — F1120 Opioid dependence, uncomplicated: Secondary | ICD-10-CM | POA: Diagnosis not present

## 2020-08-02 DIAGNOSIS — Z79891 Long term (current) use of opiate analgesic: Secondary | ICD-10-CM | POA: Diagnosis not present

## 2020-08-05 ENCOUNTER — Ambulatory Visit: Payer: PPO | Admitting: Physician Assistant

## 2020-08-08 ENCOUNTER — Ambulatory Visit: Payer: PPO | Admitting: Addiction (Substance Use Disorder)

## 2020-08-10 ENCOUNTER — Other Ambulatory Visit: Payer: Self-pay | Admitting: Physician Assistant

## 2020-08-11 NOTE — Telephone Encounter (Signed)
Pt called and said that she is out and needs to have this medicine sent in aSAP

## 2020-08-11 NOTE — Telephone Encounter (Signed)
Last filled 4/6

## 2020-08-12 ENCOUNTER — Other Ambulatory Visit: Payer: Self-pay | Admitting: Physician Assistant

## 2020-08-22 ENCOUNTER — Ambulatory Visit: Payer: PPO | Admitting: Addiction (Substance Use Disorder)

## 2020-08-22 ENCOUNTER — Other Ambulatory Visit: Payer: Self-pay

## 2020-08-22 DIAGNOSIS — M79675 Pain in left toe(s): Secondary | ICD-10-CM | POA: Diagnosis not present

## 2020-08-22 DIAGNOSIS — F319 Bipolar disorder, unspecified: Secondary | ICD-10-CM | POA: Diagnosis not present

## 2020-08-22 DIAGNOSIS — B351 Tinea unguium: Secondary | ICD-10-CM | POA: Diagnosis not present

## 2020-08-22 DIAGNOSIS — F4310 Post-traumatic stress disorder, unspecified: Secondary | ICD-10-CM | POA: Diagnosis not present

## 2020-08-22 DIAGNOSIS — F603 Borderline personality disorder: Secondary | ICD-10-CM

## 2020-08-22 DIAGNOSIS — L600 Ingrowing nail: Secondary | ICD-10-CM | POA: Diagnosis not present

## 2020-08-22 DIAGNOSIS — M79674 Pain in right toe(s): Secondary | ICD-10-CM | POA: Diagnosis not present
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
## 2020-08-24 DIAGNOSIS — G894 Chronic pain syndrome: Secondary | ICD-10-CM | POA: Diagnosis not present

## 2020-08-24 DIAGNOSIS — M2669 Other specified disorders of temporomandibular joint: Secondary | ICD-10-CM | POA: Diagnosis not present

## 2020-08-24 DIAGNOSIS — F1120 Opioid dependence, uncomplicated: Secondary | ICD-10-CM | POA: Diagnosis not present

## 2020-08-30 ENCOUNTER — Other Ambulatory Visit: Payer: Self-pay | Admitting: Physician Assistant

## 2020-09-01 ENCOUNTER — Telehealth: Payer: Self-pay | Admitting: Physician Assistant

## 2020-09-01 ENCOUNTER — Other Ambulatory Visit: Payer: Self-pay

## 2020-09-01 MED ORDER — LITHIUM CARBONATE 150 MG PO CAPS: ORAL_CAPSULE | ORAL | 0 refills | Status: DC

## 2020-09-01 MED ORDER — LITHIUM CARBONATE 300 MG PO CAPS: ORAL_CAPSULE | ORAL | 0 refills | Status: DC

## 2020-09-01 NOTE — Telephone Encounter (Signed)
Informed pt Katherine James is out of the office until next week.She stated the adderall was a rx from last year that she is taking and its working well for her.She wants to know if she can get a rx sent in.

## 2020-09-01 NOTE — Telephone Encounter (Signed)
Pt would like a call back to discuss her medication. Her pain management provider will be starting her on Nucynta 50mg  for pain. Pt wants to get approval that it will be ok from [facility]. Pt also had started on an old rx of Adderall and it has eally helped out. Pt would like a rx sent in at [facility]

## 2020-09-02 DIAGNOSIS — N390 Urinary tract infection, site not specified: Secondary | ICD-10-CM | POA: Diagnosis not present

## 2020-09-02 DIAGNOSIS — R300 Dysuria: Secondary | ICD-10-CM | POA: Diagnosis not present

## 2020-09-02 DIAGNOSIS — A499 Bacterial infection, unspecified: Secondary | ICD-10-CM | POA: Diagnosis not present

## 2020-09-05 ENCOUNTER — Other Ambulatory Visit: Payer: Self-pay

## 2020-09-05 ENCOUNTER — Ambulatory Visit: Payer: PPO | Admitting: Addiction (Substance Use Disorder)

## 2020-09-05 ENCOUNTER — Telehealth: Payer: Self-pay

## 2020-09-05 ENCOUNTER — Ambulatory Visit: Payer: PPO | Admitting: Physician Assistant

## 2020-09-05 DIAGNOSIS — F319 Bipolar disorder, unspecified: Secondary | ICD-10-CM

## 2020-09-05 DIAGNOSIS — F603 Borderline personality disorder: Secondary | ICD-10-CM | POA: Diagnosis not present

## 2020-09-05 NOTE — Progress Notes (Signed)
      [facility] Counselor/Therapist Progress Note  Patient ID: Karee Forge, MRN: [id number],    Date: 09/05/2020  Time Spent: 66mins  Treatment Type: Individual Therapy  Reported Symptoms: tearful, touched, but triggered also   Mental Status Exam:  Appearance:   Casual and Fairly Groomed     Behavior:  Sharing  Motor:  Restlestness  Speech/Language:   Clear and Coherent and Pressured  Affect:  Congruent, Labile, and Tearful  Mood:  anxious and sad  Thought process:  normal  Thought content:    Obsessions and Rumination  Sensory/Perceptual disturbances:    Flashback  Orientation:  x4  Attention:  Fair  Concentration:  Good  Memory:  impaired  Fund of knowledge:   Fair  Insight:    Fair  Judgment:   Fair  Impulse Control:  Fair   Risk Assessment: Danger to Self:  No Self-injurious Behavior: No Danger to Others: No Duty to Warn:no Physical Aggression / Violence:No  Access to Firearms a concern: No  Gang Involvement:No   Subjective: Client reported getting weekly Ketamine treatments for her pain and depression and having some good sessions, and even "time traveling", and processing past painful experiences. Client discussed, tearfully, how her younger self had told her she was proud of her. Client touched by this experience and recognized her inner strength further. Therapist used MI to affirm client's strengths and encourage the processing of her inner feelings. Therapist used Mindfulness with client and helped her begin to process her internal feelings and sensations. Client made some progress identifying some, but also struggled to regulate as they processed. Therapist assessed for safety and client denied SI/HI/AVH. Therapist also led client in emotional grounding exercise to help her regulate while processing.   Interventions: Mindfulness Meditation, Motivational Interviewing, and RPT  Diagnosis:   ICD-10-CM   1. Borderline personality disorder ([place])  F60.3      2. Bipolar I disorder ([place])  F31.9        Plan of Care: Client to return for weekly therapy with Sammuel Cooper, therapist, to review again in 6 months.  Client is to continue seeing medication provider for support of mood management.   Client to engage in CBT: challenging negative internal ruminations and self-talk AEB journaling daily or expressing thoughts to support persons in their life and then challenging it with truth.  Client to engage in tapping to tap in healthy cognition that challenges negative rumination or deep negative core belief.  Client to practice DBT distress tolerance skills to decrease crying spells and thoughts of not being able to endure their suffering AEB using mindfulness, deep breathing, and TIP to increase tolerance for discomfort, discharge emotional distress, and increase their understanding that they can do hard things.  Client to utilize BSP (brainspotting) with therapist to help client identify and process triggers for their depressive episode and anxiety with goal of reducing said SUDs caused by depression/anxiety by 33% in the next 6 months.  Client to prioritize sleep 8+ hours each week night AEB going to bed by 10pm each night.    Barnie Del, LCSW, LCAS, CCTP, CCS-I, BSP

## 2020-09-05 NOTE — Telephone Encounter (Signed)
Pt was in office today seeing Lonn Georgia and was inquiring about her call from last week, pain management adding Nucynta, she is asking if okay with other medications?

## 2020-09-05 NOTE — Telephone Encounter (Signed)
See other note

## 2020-09-05 NOTE — Telephone Encounter (Signed)
Nucynta is ok with psych meds. I think she had also asked about getting a new Rx for Adderall. No Rx for that, at least not now. Because it's a stimulant and she's on  BZ and the pain med which are downers, they cancel each other out.

## 2020-09-06 DIAGNOSIS — H02831 Dermatochalasis of right upper eyelid: Secondary | ICD-10-CM | POA: Diagnosis not present

## 2020-09-06 DIAGNOSIS — H02403 Unspecified ptosis of bilateral eyelids: Secondary | ICD-10-CM | POA: Insufficient documentation

## 2020-09-06 DIAGNOSIS — H57813 Brow ptosis, bilateral: Secondary | ICD-10-CM | POA: Insufficient documentation

## 2020-09-06 DIAGNOSIS — H02834 Dermatochalasis of left upper eyelid: Secondary | ICD-10-CM | POA: Diagnosis not present

## 2020-09-06 NOTE — Telephone Encounter (Signed)
Rtc to pt and informed her about the Nucynta and her psych meds. Advised her the Adderall wouldn't be restarted at this time. She reports it was an old Rx, she was having so much low energy and fatigue but when she took it gave her energy and kept her moving. Advised her to discuss at next apt. She also reports her neurologist would like to increase her Lamictal by 50 mg to 400 mg total and they wanted to make sure that is okay with Katherine James first?  Informed her I would check and follow up with her.

## 2020-09-07 NOTE — Telephone Encounter (Signed)
Increasing Lamictal to 400 mg is fine,

## 2020-09-08 ENCOUNTER — Other Ambulatory Visit: Payer: Self-pay

## 2020-09-08 DIAGNOSIS — R2681 Unsteadiness on feet: Secondary | ICD-10-CM | POA: Diagnosis not present

## 2020-09-08 DIAGNOSIS — F3181 Bipolar II disorder: Secondary | ICD-10-CM | POA: Diagnosis not present

## 2020-09-08 DIAGNOSIS — H5500 Unspecified nystagmus: Secondary | ICD-10-CM | POA: Diagnosis not present

## 2020-09-08 DIAGNOSIS — R4182 Altered mental status, unspecified: Secondary | ICD-10-CM | POA: Diagnosis not present

## 2020-09-08 MED ORDER — LAMOTRIGINE 200 MG PO TABS: 400.0000 mg | ORAL_TABLET | Freq: Every day | ORAL | 1 refills | Status: DC

## 2020-09-08 NOTE — Telephone Encounter (Signed)
Updated Rx was sent to her pharmacy. Need to make sure pt is aware okay to increase.

## 2020-09-12 ENCOUNTER — Other Ambulatory Visit: Payer: Self-pay

## 2020-09-12 ENCOUNTER — Ambulatory Visit: Payer: PPO | Admitting: Addiction (Substance Use Disorder)

## 2020-09-12 DIAGNOSIS — F319 Bipolar disorder, unspecified: Secondary | ICD-10-CM

## 2020-09-12 NOTE — Progress Notes (Signed)
      [facility] Counselor/Therapist Progress Note  Patient ID: Katherine James, MRN: [id number],    Date: 09/12/2020  Time Spent: 54 mins  Treatment Type: Individual Therapy  Reported Symptoms: emotional, sad, upset  Mental Status Exam:  Appearance:   Casual and Fairly Groomed     Behavior:  Sharing  Motor:  Restlestness  Speech/Language:   Clear and Coherent  Affect:  Blunt, Labile, and Tearful  Mood:  depressed, labile, and sad  Thought process:  normal  Thought content:    Obsessions and Rumination  Sensory/Perceptual disturbances:    Flashback  Orientation:  x4  Attention:  Fair  Concentration:  Good  Memory:  impaired  Fund of knowledge:   Good  Insight:    Fair  Judgment:   Fair  Impulse Control:  Fair   Risk Assessment: Danger to Self:  No Self-injurious Behavior: No Danger to Others: No Duty to Warn:no Physical Aggression / Violence:No  Access to Firearms a concern: No  Gang Involvement:No   Subjective: Client reported hearing bad news about brain damage that could be causing all of her medical and mental health diagnoses. Client reports having seen a neurologist who got an EEG on her brain and discussed the ramifications for her concussions. Client plans to see a nueropsychologist to help rehab her brain capacity and her feelings of despair or hopeless she is struggling with. Therapist used MI and Grief therapy to support client in processing her grief and feelings of hopelessness, helping to encourage her in finding a positive way to think. Client processed how her best friendship has changed that causes her grief and the reason she thinks she blew up on them and blacking out memory-wise. Therapist assessed for safety and client denied SI/HI/AVH. Therapist also used mindfulness to help support in grounding and finding a feeling of hope inside her to change her perspective.   Interventions: Mindfulness Meditation, Motivational Interviewing, Grief Therapy, and  RPT  Diagnosis:   ICD-10-CM   1. Bipolar I disorder ([place])  F31.9     Reports brain damage results from EEG & Neurologist    Plan of Care: Client to return for weekly therapy with Sammuel Cooper, therapist, to review again in 6 months.  Client is to continue seeing medication provider for support of mood management.   Client to engage in CBT: challenging negative internal ruminations and self-talk AEB journaling daily or expressing thoughts to support persons in their life and then challenging it with truth.  Client to engage in tapping to tap in healthy cognition that challenges negative rumination or deep negative core belief.  Client to practice DBT distress tolerance skills to decrease crying spells and thoughts of not being able to endure their suffering AEB using mindfulness, deep breathing, and TIP to increase tolerance for discomfort, discharge emotional distress, and increase their understanding that they can do hard things.  Client to utilize BSP (brainspotting) with therapist to help client identify and process triggers for their depressive episode and anxiety with goal of reducing said SUDs caused by depression/anxiety by 33% in the next 6 months.  Client to prioritize sleep 8+ hours each week night AEB going to bed by 10pm each night.    Barnie Del, LCSW, LCAS, CCTP, CCS-I, BSP

## 2020-09-19 ENCOUNTER — Other Ambulatory Visit: Payer: Self-pay

## 2020-09-19 ENCOUNTER — Ambulatory Visit: Payer: PPO | Admitting: Addiction (Substance Use Disorder)

## 2020-09-19 DIAGNOSIS — M79674 Pain in right toe(s): Secondary | ICD-10-CM | POA: Diagnosis not present

## 2020-09-19 DIAGNOSIS — F603 Borderline personality disorder: Secondary | ICD-10-CM | POA: Diagnosis not present

## 2020-09-19 DIAGNOSIS — M79675 Pain in left toe(s): Secondary | ICD-10-CM | POA: Diagnosis not present

## 2020-09-19 DIAGNOSIS — L600 Ingrowing nail: Secondary | ICD-10-CM | POA: Diagnosis not present

## 2020-09-19 DIAGNOSIS — F319 Bipolar disorder, unspecified: Secondary | ICD-10-CM

## 2020-09-19 DIAGNOSIS — B351 Tinea unguium: Secondary | ICD-10-CM | POA: Diagnosis not present
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
## 2020-09-24 DIAGNOSIS — N390 Urinary tract infection, site not specified: Secondary | ICD-10-CM | POA: Diagnosis not present

## 2020-09-26 ENCOUNTER — Ambulatory Visit: Payer: PPO | Admitting: Addiction (Substance Use Disorder)

## 2020-10-06 DIAGNOSIS — H5500 Unspecified nystagmus: Secondary | ICD-10-CM | POA: Diagnosis not present

## 2020-10-06 DIAGNOSIS — R2681 Unsteadiness on feet: Secondary | ICD-10-CM | POA: Diagnosis not present

## 2020-10-06 DIAGNOSIS — F3181 Bipolar II disorder: Secondary | ICD-10-CM | POA: Diagnosis not present

## 2020-10-10 ENCOUNTER — Other Ambulatory Visit: Payer: Self-pay | Admitting: Physician Assistant

## 2020-10-10 ENCOUNTER — Other Ambulatory Visit: Payer: Self-pay

## 2020-10-10 ENCOUNTER — Ambulatory Visit (INDEPENDENT_AMBULATORY_CARE_PROVIDER_SITE_OTHER): Payer: PPO | Admitting: Addiction (Substance Use Disorder)

## 2020-10-10 DIAGNOSIS — F603 Borderline personality disorder: Secondary | ICD-10-CM | POA: Diagnosis not present

## 2020-10-10 NOTE — Progress Notes (Signed)
      [facility] Counselor/Therapist Progress Note  Patient ID: Katherine James, MRN: [id number],    Date: 10/10/2020  Time Spent: 67mns  Treatment Type: Individual Therapy  Reported Symptoms: angry, upset with everyone in her life  Mental Status Exam:  Appearance:   Casual and Well Groomed     Behavior:  Sharing  Motor:  Normal  Speech/Language:   Clear and Coherent  Affect:  Congruent, Labile, and Tearful  Mood:  angry, irritable, labile, and sad  Thought process:  normal  Thought content:    Obsessions and Rumination  Sensory/Perceptual disturbances:    Flashback  Orientation:  x4  Attention:  Fair  Concentration:  Good  Memory:  impaired  Fund of knowledge:   Good  Insight:    Fair  Judgment:   Fair  Impulse Control:  Fair   Risk Assessment: Danger to Self:  No Self-injurious Behavior: No Danger to Others: No Duty to Warn:no Physical Aggression / Violence:No  Access to Firearms a concern: No  Gang Involvement:No   Subjective: Client processed ongoing anger towards her friend who she treats like her older sister, with whom she has no relationship/ has been ostracized from. Client talked about what it was like in the midst of her depression to not hear back much from her only closest friend. Client struggling with black and white thoughts and extreme thinking and expectations with relationships that are unrealistic. Client processed her method for grieving the loss of her friend by ridding her life of all their pictures together and telling herself that her friend hates her to try to detach from her. Therapist used MI & CBT with client to validate her hurt feelings and thoughts and help challenge her thoughts that are unrealistic expectations to have of others. Therapist helped her consider ways to talk about her feelings with friends and help her deal with inner insecurities & trust issues that keep her from bonding with friends. Therapist assessed for safety and client  denied SI/HI/AVH.   Interventions: Cognitive Behavioral Therapy, Dialectical Behavioral Therapy, Motivational Interviewing, and RPT  Diagnosis:   ICD-10-CM   1. Borderline personality disorder ([place]  F60.3      Reports brain damage results from EEG & Neurologist   Plan of Care: Client to return for weekly therapy with KSammuel Cooper therapist, to review again in 6 months.  Client is to continue seeing medication provider for support of mood management.   Client to engage in CBT: challenging negative internal ruminations and self-talk AEB journaling daily or expressing thoughts to support persons in their life and then challenging it with truth.  Client to engage in tapping to tap in healthy cognition that challenges negative rumination or deep negative core belief.  Client to practice DBT distress tolerance skills to decrease crying spells and thoughts of not being able to endure their suffering AEB using mindfulness, deep breathing, and TIP to increase tolerance for discomfort, discharge emotional distress, and increase their understanding that they can do hard things.  Client to utilize BSP (brainspotting) with therapist to help client identify and process triggers for their depressive episode and anxiety with goal of reducing said SUDs caused by depression/anxiety by 33% in the next 6 months.  Client to prioritize sleep 8+ hours each week night AEB going to bed by 10pm each night.    KBarnie Del LCSW, LCAS, CCTP, CCS-I, BSP

## 2020-10-11 NOTE — Telephone Encounter (Signed)
Last filled 7/19

## 2020-10-13 ENCOUNTER — Other Ambulatory Visit: Payer: Self-pay

## 2020-10-13 ENCOUNTER — Ambulatory Visit (INDEPENDENT_AMBULATORY_CARE_PROVIDER_SITE_OTHER): Payer: PPO | Admitting: Physician Assistant

## 2020-10-13 ENCOUNTER — Encounter: Payer: Self-pay | Admitting: Physician Assistant

## 2020-10-13 DIAGNOSIS — H5319 Other subjective visual disturbances: Secondary | ICD-10-CM | POA: Diagnosis not present

## 2020-10-13 DIAGNOSIS — F4310 Post-traumatic stress disorder, unspecified: Secondary | ICD-10-CM | POA: Diagnosis not present

## 2020-10-13 DIAGNOSIS — G4700 Insomnia, unspecified: Secondary | ICD-10-CM | POA: Diagnosis not present

## 2020-10-13 DIAGNOSIS — F319 Bipolar disorder, unspecified: Secondary | ICD-10-CM

## 2020-10-13 DIAGNOSIS — F900 Attention-deficit hyperactivity disorder, predominantly inattentive type: Secondary | ICD-10-CM

## 2020-10-13 DIAGNOSIS — F32A Depression, unspecified: Secondary | ICD-10-CM | POA: Diagnosis not present

## 2020-10-13 MED ORDER — AMPHETAMINE-DEXTROAMPHETAMINE 10 MG PO TABS: 5.0000 mg | ORAL_TABLET | Freq: Every day | ORAL | 0 refills | Status: DC

## 2020-10-13 NOTE — Progress Notes (Signed)
[facility] Med Check  Patient ID: Katherine James,  MRN: [id number]  PCP: Melany Guernsey  Date of Evaluation: 10/13/2020  time spent:45 minutes  Chief Complaint:  Chief Complaint   Anxiety; Depression     HISTORY/CURRENT STATUS: HPI For routine med check.  Keontae is frustrated because "somebody has her diagnosed with borderline personality disorder" and never told her about it.  She asks how to get that removed from her chart.  States that in her mind it is worse than having bipolar disorder.  She does not feel like she has it.   She still has a hard time enjoying things, grieving the loss of her mom and relationships with her family and a friend.  Energy and motivation are very low.  She has a lot of difficulty staying on task.  Easily distracted.  Has used Adderall for this in the past and it did help.  Modafinil, armodafinil were not beneficial.  Tends to isolate but no change in her normal.  Does not cry easily. Had Spravato with Dr. Toy Care twice. Helping a little but does not work nearly as well as IV ketamine does.  Not sure if she will continue.  Sleeps fairly well with the Belsomra.  No suicidal or homicidal thoughts.  Patient denies increased energy with decreased need for sleep, no increased talkativeness, no racing thoughts, no impulsivity or risky behaviors, no increased spending, no increased libido, no grandiosity, no increased irritability or anger, no paranoia, and no hallucinations.  Review of Systems  Constitutional:  Positive for malaise/fatigue.  HENT: Negative.    Eyes:        Oscillopsia  Respiratory: Negative.    Cardiovascular: Negative.   Gastrointestinal: Negative.   Genitourinary: Negative.   Musculoskeletal:  Positive for back pain and myalgias.  Skin: Negative.   Neurological:        Oscillopsia.  Sees provider at [facility].  Endo/Heme/Allergies: Negative.   Psychiatric/Behavioral:         See HPI.    Individual Medical History/ Review of  Systems: Changes? :Yes    see above.  Past medications for mental health diagnoses include: Seroquel, Lamictal, lithium, Provigil, Nuvigil, Adderall, mirtazapine, Celexa, Depakote, Wellbutrin, Prozac, Paxil, desipramine, [place] caused rash, had 2 treatments of Spravato with Dr. Toy Care   Allergies: Renee Harder [deutetrabenazine], Alprazolam, Amoxicillin-pot clavulanate, Celecoxib, Chlorpromazine hcl, Codeine, Depakote [divalproex sodium], Desipramine, Diclofenac sodium, Doxycycline, Etodolac, Fluoxetine hcl, Fluticasone propionate, Ibuprofen, Influenza vaccines, Iodine, Ketorolac tromethamine, Levofloxacin, Meperidine hcl, Nabumetone, Naproxen, Neurontin [gabapentin], Nsaids, Olive oil, Oxaprozin, Paroxetine, Phenergan [promethazine hcl], Piroxicam, Pneumococcal vaccines, Rofecoxib, Skelaxin [metaxalone], Sulfamethoxazole-trimethoprim, Sulfasalazine, Sulindac, Symbicort [budesonide-formoterol fumarate], Terfenadine, Tetracycline, Tramadol hcl, Wellbutrin [bupropion], Zinc, and Lisinopril  Current Medications:  Current Outpatient Medications:    amphetamine-dextroamphetamine (ADDERALL) 10 MG tablet, Take 0.5-1 tablets (5-10 mg total) by mouth daily with breakfast., Disp: 30 tablet, Rfl: 0   BELSOMRA 20 MG TABS, TAKE 1 TABLET BY MOUTH AT BEDTIME AS NEEDED, Disp: 30 tablet, Rfl: 0   carisoprodol (SOMA) 350 MG tablet, Take 3 tablets daily, Disp: , Rfl:    lamoTRIgine (LAMICTAL) 200 MG tablet, Take 2 tablets (400 mg total) by mouth daily., Disp: 180 tablet, Rfl: 1   levothyroxine (SYNTHROID) 50 MCG tablet, Take 50 mcg by mouth daily before breakfast., Disp: , Rfl:    lithium carbonate 150 MG capsule, 1 qd with the '300mg'$  (3)= 1,'050mg'$  daily, Disp: 90 capsule, Rfl: 0   lithium carbonate 300 MG capsule, TAKE 3 CAPSULES BY MOUTH AT BEDTIME. TAKE WITH '150MG'$   CAPSULE, Disp: 270 capsule, Rfl: 0   LORazepam (ATIVAN) 0.5 MG tablet, TAKE 1/2 TO 1 TABLET BY MOUTH TWICE A DAY AS NEEDED FOR ANXIETY, Disp: 30 tablet, Rfl: 5    ondansetron (ZOFRAN) 4 MG tablet, TAKE 1 AND 1/2 TABLETS BY MOUTH DAILY FOR NAUSEA, Disp: 45 tablet, Rfl: 0   oxyCODONE (OXY IR/ROXICODONE) 5 MG immediate release tablet, Take 10 mg daily as needed by mouth for severe pain or breakthrough pain. , Disp: , Rfl:    pregabalin (LYRICA) 25 MG capsule, Take 25 mg by mouth 2 (two) times daily. , Disp: , Rfl:    Pregnenolone Micronized (PREGNENOLONE PO), Take by mouth., Disp: , Rfl:    progesterone (PROMETRIUM) 200 MG capsule, Take 200 mg by mouth daily., Disp: , Rfl:    UNABLE TO FIND, DHEA SR (compound)- take 17 mg (1 tablet) daily except Saturday and Sunday, Disp: , Rfl:    UNABLE TO FIND, Hormone Topical Cream (Compound): Bi-Est (8:2) 1 mg, apply 1 gram every morning, Disp: , Rfl:    UNABLE TO FIND, Fentenyl Pain Patch, apply one 12 mcg patch every 3 days for chronic pain, Disp: , Rfl:    UNABLE TO FIND, Nature's Thryoid- take 3 g M, W and F, and 2 g every other day, Disp: , Rfl:    UNABLE TO FIND, Progesterone SR 300 mg (compound), take 1 tablet daily at bedtime, Disp: , Rfl:    UNABLE TO FIND, Seroquel (Compound)- 8.3 mg per mL, take 1 mL daily at bedtime, Disp: , Rfl:    UNABLE TO FIND, Pregnenolone 90 mg (compound), take 1 tablet daily, Disp: , Rfl:    UNABLE TO FIND, Testosterone Cream 0.5%- 1g/73m, Disp: , Rfl:    hydrochlorothiazide (HYDRODIURIL) 25 MG tablet, TAKE 1 TABLET BY MOUTH ONCE DAILY. (Patient not taking: No sig reported), Disp: 30 tablet, Rfl: 1 Medication Side Effects: none  Family Medical/ Social History: Changes? No  MENTAL HEALTH EXAM:  There were no vitals taken for this visit.There is no height or weight on file to calculate BMI.  General Appearance: Casual and Well Groomed  Eye Contact:   Normal eye contact but has up/down beating nystagmus  Speech:  Clear and Coherent and Normal Rate  Volume:  Normal  Mood:  Euthymic  Affect:  Appropriate  Thought Process:  Goal Directed and Descriptions of Associations: Intact   Orientation:  Full (Time, Place, and Person)  Thought Content: Logical   Suicidal Thoughts:  No  Homicidal Thoughts:  No  Memory:  WNL  Judgement:  Good  Insight:  Good  Psychomotor Activity:  Normal   Concentration:  Concentration: Good and Attention Span: Poor  Recall:  Good  Fund of Knowledge: Good  Language: Good  Assets:  Desire for Improvement  ADL's:  Intact  Cognition: WNL  Prognosis:  Good   06/06/2020 Lithium 0.9  DIAGNOSES:    ICD-10-CM   1. Bipolar I disorder ([place]  F31.9     2. PTSD (post-traumatic stress disorder)  F43.10     3. Insomnia, unspecified type  G47.00     4. Attention deficit hyperactivity disorder (ADHD), predominantly inattentive type  F90.0     5. 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 Melancholy  F32.A        Receiving Psychotherapy: Yes  KSammuel Cooper LCSW  RECOMMENDATIONS:  PDMP was reviewed.  Last Belsomra was filled 10/11/2020, opiates known to me.  I provided 45 minutes of face to face time  during this encounter, including time spent before and after the visit in records review, medical decision making, and charting.  Recommend cont Spravato with Dr. Toy Care. We discussed the diagnosis of borderline personality disorder, that some sx overlap with Bipolar D/O and it's difficult to differentiate the two. I recommend she discuss in therapy at the next visit. Will restart Adderall 10 mg, 1/2-1 po q am.prn. Increase Lamictal 200 mg, 1 po bid. Continue lithium 1050 mg/day (3x300+150 mg caps) Continue Belsomra 20 mg, 1 p.o. nightly as needed. Continue Seroquel 8.3 mg daily, she has compounded. Cont therapy Return in 2 months.  Donnal Moat, PA-C

## 2020-10-14 ENCOUNTER — Ambulatory Visit: Payer: PPO | Admitting: Physician Assistant

## 2020-10-19 DIAGNOSIS — Z79899 Other long term (current) drug therapy: Secondary | ICD-10-CM | POA: Diagnosis not present

## 2020-10-20 DIAGNOSIS — G4730 Sleep apnea, unspecified: Secondary | ICD-10-CM | POA: Diagnosis not present

## 2020-10-20 DIAGNOSIS — E782 Mixed hyperlipidemia: Secondary | ICD-10-CM | POA: Diagnosis not present

## 2020-10-20 DIAGNOSIS — M609 Myositis, unspecified: Secondary | ICD-10-CM | POA: Diagnosis not present

## 2020-10-20 DIAGNOSIS — R7309 Other abnormal glucose: Secondary | ICD-10-CM | POA: Diagnosis not present

## 2020-10-20 DIAGNOSIS — E039 Hypothyroidism, unspecified: Secondary | ICD-10-CM | POA: Diagnosis not present

## 2020-10-20 DIAGNOSIS — N959 Unspecified menopausal and perimenopausal disorder: Secondary | ICD-10-CM | POA: Diagnosis not present

## 2020-10-22 ENCOUNTER — Encounter: Payer: Self-pay | Admitting: Physician Assistant

## 2020-10-24 ENCOUNTER — Ambulatory Visit: Payer: PPO | Admitting: Addiction (Substance Use Disorder)

## 2020-10-26 DIAGNOSIS — G894 Chronic pain syndrome: Secondary | ICD-10-CM | POA: Diagnosis not present

## 2020-10-26 DIAGNOSIS — F1120 Opioid dependence, uncomplicated: Secondary | ICD-10-CM | POA: Diagnosis not present

## 2020-10-26 DIAGNOSIS — M2669 Other specified disorders of temporomandibular joint: Secondary | ICD-10-CM | POA: Diagnosis not present

## 2020-11-08 ENCOUNTER — Other Ambulatory Visit: Payer: Self-pay | Admitting: Physician Assistant

## 2020-11-11 NOTE — Telephone Encounter (Signed)
Please send if appropriate Next apt 10/11

## 2020-11-14 ENCOUNTER — Ambulatory Visit (INDEPENDENT_AMBULATORY_CARE_PROVIDER_SITE_OTHER): Payer: PPO | Admitting: Addiction (Substance Use Disorder)

## 2020-11-14 ENCOUNTER — Other Ambulatory Visit: Payer: Self-pay | Admitting: Physician Assistant

## 2020-11-14 ENCOUNTER — Other Ambulatory Visit: Payer: Self-pay

## 2020-11-14 DIAGNOSIS — E509 Vitamin A deficiency, unspecified: Secondary | ICD-10-CM | POA: Diagnosis not present

## 2020-11-14 DIAGNOSIS — F319 Bipolar disorder, unspecified: Secondary | ICD-10-CM

## 2020-11-14 DIAGNOSIS — R202 Paresthesia of skin: Secondary | ICD-10-CM | POA: Diagnosis not present

## 2020-11-14 DIAGNOSIS — F603 Borderline personality disorder: Secondary | ICD-10-CM

## 2020-11-14 DIAGNOSIS — E519 Thiamine deficiency, unspecified: Secondary | ICD-10-CM | POA: Diagnosis not present

## 2020-11-14 DIAGNOSIS — D649 Anemia, unspecified: Secondary | ICD-10-CM | POA: Diagnosis not present

## 2020-11-14 DIAGNOSIS — F4321 Adjustment disorder with depressed mood: Secondary | ICD-10-CM | POA: Diagnosis not present

## 2020-11-14 DIAGNOSIS — R42 Dizziness and giddiness: Secondary | ICD-10-CM | POA: Diagnosis not present

## 2020-11-14 DIAGNOSIS — G589 Mononeuropathy, unspecified: Secondary | ICD-10-CM | POA: Diagnosis not present

## 2020-11-14 DIAGNOSIS — R52 Pain, unspecified: Secondary | ICD-10-CM | POA: Diagnosis not present

## 2020-11-14 DIAGNOSIS — M6281 Muscle weakness (generalized): Secondary | ICD-10-CM | POA: Diagnosis not present

## 2020-11-14 DIAGNOSIS — R262 Difficulty in walking, not elsewhere classified: Secondary | ICD-10-CM | POA: Diagnosis not present

## 2020-11-14 DIAGNOSIS — R270 Ataxia, unspecified: Secondary | ICD-10-CM | POA: Diagnosis not present

## 2020-11-14 NOTE — Progress Notes (Signed)
[facility] Counselor/Therapist Progress Note  Patient ID: Takyrah Hollingsworth, MRN: [id number],    Date: 11/14/2020  Time Spent: 11mns  Treatment Type: Individual Therapy  Reported Symptoms: shocked  Mental Status Exam:  Appearance:   Casual and Well Groomed     Behavior:  Sharing  Motor:  Normal  Speech/Language:   Clear and Coherent  Affect:  Congruent and Labile  Mood:  labile and sad  Thought process:  normal  Thought content:    Obsessions and Rumination  Sensory/Perceptual disturbances:    Flashback  Orientation:  x4  Attention:  Fair  Concentration:  Good  Memory:  impaired  Fund of knowledge:   Good  Insight:    Fair  Judgment:   Fair  Impulse Control:  Fair   Risk Assessment: Danger to Self:  No Self-injurious Behavior: No Danger to Others: No Duty to Warn:no Physical Aggression / Violence:No  Access to Firearms a concern: No  Gang Involvement:No   Subjective: Client processed "coming to a lot of conclusions over the last month, about her family". Client feeling shocked about more and more she is understanding about her hx with her 2 friends and her siblings.Client understanding that she is repeating being in relationships that manipulate and hurt her; client desperately wants the love of others, despite what it emotionally costs her. Client recognized she has a pattern of being deceived and falling into believing them or letting them into her life and her heart. Client also realizing she has little interpersonal skills and asked therapist about why that might be/ how to fix it. Therapist used psychoeducation and MI to validate client's emotions while also trying to explain certain behavioral patterns of hers again & client strongly disagreed with therapist's inference that she had a BPD dx. Therefore, therapist avoided the discussion of why she had this pattern and instead tried to help her find skills to use to regulate herself and help her to have healthy  relationships. Therapist used DBT to educate client about interpersonal effectiveness behaviors to assist with changing these patterns in her life. Therapist assessed for safety and client denied SI/HI/AVH.   Interventions: Dialectical Behavioral Therapy, Motivational Interviewing, Psycho-education/Bibliotherapy, and RPT  Diagnosis:   ICD-10-CM   1. Bipolar I disorder ([place]  F31.9     2. Borderline personality disorder ([place]  F60.3     3. Grief  F43.21      Reports brain damage results from EEG & Neurologist   Plan of Care: Client to return for weekly therapy with KSammuel Cooper therapist, to review again in 6 months.  Client is to continue seeing medication provider for support of mood management.   Client to engage in CBT: challenging negative internal ruminations and self-talk AEB journaling daily or expressing thoughts to support persons in their life and then challenging it with truth.  Client to engage in tapping to tap in healthy cognition that challenges negative rumination or deep negative core belief.  Client to practice DBT distress tolerance skills to decrease crying spells and thoughts of not being able to endure their suffering AEB using mindfulness, deep breathing, and TIP to increase tolerance for discomfort, discharge emotional distress, and increase their understanding that they can do hard things.  Client to utilize BSP (brainspotting) with therapist to help client identify and process triggers for their depressive episode and anxiety with goal of reducing said SUDs caused by depression/anxiety by 33% in the next 6 months.  Client to prioritize sleep 8+ hours  each week night AEB going to bed by 10pm each night.    Barnie Del, LCSW, LCAS, CCTP, CCS-I, BSP

## 2020-11-15 NOTE — Telephone Encounter (Signed)
Last filled 8/18

## 2020-11-16 ENCOUNTER — Other Ambulatory Visit: Payer: Self-pay | Admitting: Physician Assistant

## 2020-11-17 NOTE — Telephone Encounter (Signed)
Please refuse

## 2020-11-21 ENCOUNTER — Ambulatory Visit: Payer: PPO | Admitting: Addiction (Substance Use Disorder)

## 2020-11-23 ENCOUNTER — Other Ambulatory Visit: Payer: Self-pay

## 2020-11-23 ENCOUNTER — Ambulatory Visit: Payer: PPO | Admitting: Dermatology

## 2020-11-23 DIAGNOSIS — D485 Neoplasm of uncertain behavior of skin: Secondary | ICD-10-CM

## 2020-11-23 DIAGNOSIS — D1800 Hemangioma unspecified site: Secondary | ICD-10-CM

## 2020-11-23 DIAGNOSIS — L858 Other specified epidermal thickening: Secondary | ICD-10-CM | POA: Diagnosis not present

## 2020-11-23 DIAGNOSIS — L853 Xerosis cutis: Secondary | ICD-10-CM

## 2020-11-23 DIAGNOSIS — L820 Inflamed seborrheic keratosis: Secondary | ICD-10-CM

## 2020-11-23 DIAGNOSIS — L814 Other melanin hyperpigmentation: Secondary | ICD-10-CM

## 2020-11-23 DIAGNOSIS — L821 Other seborrheic keratosis: Secondary | ICD-10-CM | POA: Diagnosis not present

## 2020-11-23 DIAGNOSIS — B078 Other viral warts: Secondary | ICD-10-CM

## 2020-11-23 NOTE — Progress Notes (Signed)
Follow-Up Visit   Subjective  Katherine James is a 60 y.o. female who presents for the following: Dark spot (Left knee, new since last visit. ) and Rough spots (Left back, R upper arm, L forearm x 2 (growing), mid forehead (recurrent), L chest (? recurrent).). Spots are irritating.   The following portions of the chart were reviewed this encounter and updated as appropriate:       Review of Systems:  No other skin or systemic complaints except as noted in HPI or Assessment and Plan.  Objective  Well appearing patient in no apparent distress; mood and affect are within normal limits.  A focused examination was performed including face, trunk, extremities. Relevant physical exam findings are noted in the Assessment and Plan.  Left Lower Sternum 7.76mm pink scaly papule        Left Forearm x 2, mid forehead x 1 (recurrent), L lower sternum (sup to bx site) x 1 (4) Waxy flesh papule central upper forehead at hairline; waxy pink macule on the left forearm x 2. Pink scaly papule L sternum sup to bx site   Assessment & Plan  Hemangiomas - Red papules, including 3.22mm violaceous macule of the left upper knee - Discussed benign nature - Observe - Call for any changes  Seborrheic Keratoses - Stuck-on, waxy, tan-brown papules and/or plaques  - Benign-appearing - Discussed benign etiology and prognosis. - Observe - Call for any changes  Lentigines - Scattered tan macules - Due to sun exposure - Benign-appering, observe - Recommend daily broad spectrum sunscreen SPF 30+ to sun-exposed areas, reapply every 2 hours as needed. - Call for any changes   Xerosis - diffuse xerotic patches - recommend gentle, hydrating skin care - gentle skin care handout given  Neoplasm of uncertain behavior of skin Left Lower Sternum  Skin / nail biopsy Type of biopsy: tangential   Informed consent: discussed and consent obtained   Patient was prepped and draped in usual sterile fashion:  Area prepped with alcohol. Anesthesia: the lesion was anesthetized in a standard fashion   Anesthetic:  1% lidocaine w/ epinephrine 1-100,000 buffered w/ 8.4% NaHCO3 Instrument used: flexible razor blade   Hemostasis achieved with: pressure, aluminum chloride and electrodesiccation   Outcome: patient tolerated procedure well    Destruction of lesion  Destruction method: electrodesiccation and curettage   Informed consent: discussed and consent obtained   Timeout:  patient name, date of birth, surgical site, and procedure verified Curettage performed in three different directions: Yes   Electrodesiccation performed over the curetted area: Yes   Final wound size (cm):  0.9 Hemostasis achieved with:  pressure, aluminum chloride and electrodesiccation Outcome: patient tolerated procedure well with no complications   Post-procedure details: wound care instructions given   Post-procedure details comment:  Ointment and bandage applied.  Specimen 1 - Surgical pathology Differential Diagnosis: Hypertrophic AK r/o SCC Check Margins: No 7.37mm pink scaly papule EDC today  Inflamed seborrheic keratosis Left Forearm x 2, mid forehead x 1 (recurrent), L lower sternum (sup to bx site) x 1  Destruction of lesion - Left Forearm x 2, mid forehead x 1 (recurrent), L lower sternum (sup to bx site) x 1  Destruction method: cryotherapy   Informed consent: discussed and consent obtained   Lesion destroyed using liquid nitrogen: Yes   Region frozen until ice ball extended beyond lesion: Yes   Outcome: patient tolerated procedure well with no complications   Post-procedure details: wound care instructions given   Additional details:  Prior to procedure, discussed risks of blister formation, small wound, skin dyspigmentation, or rare scar following cryotherapy. Recommend Vaseline ointment to treated areas while healing.  Keratosis Pilaris - Tiny follicular keratotic papules of the posterior upper  arms - Benign. Genetic in nature. No cure. - Observe. - If desired, patient can use an emollient (moisturizer) containing ammonium lactate, urea or salicylic acid once a day to smooth the area. Recommend AmLactin Rapid Relief cream  Return pending bx results.  IJamesetta Orleans, CMA, am acting as scribe for Brendolyn Patty, MD .  Documentation: I have reviewed the above documentation for accuracy and completeness, and I agree with the above.  Brendolyn Patty MD

## 2020-11-23 NOTE — Patient Instructions (Addendum)
Keratosis Pilaris - upper arms - Benign. Genetic in nature. No cure. - Observe. - If desired, patient can use an emollient (moisturizer) containing ammonium lactate, urea or salicylic acid once a day to smooth the area. (LacHydrin - info given)   Cryotherapy Aftercare  Wash gently with soap and water everyday.   Apply Vaseline and Band-Aid daily until healed.    Wound Care Instructions  Cleanse wound gently with soap and water once a day then pat dry with clean gauze. Apply a thing coat of Petrolatum (petroleum jelly, "Vaseline") over the wound (unless you have an allergy to this). We recommend that you use a new, sterile tube of Vaseline. Do not pick or remove scabs. Do not remove the yellow or white "healing tissue" from the base of the wound.  Cover the wound with fresh, clean, nonstick gauze and secure with paper tape. You may use Band-Aids in place of gauze and tape if the would is small enough, but would recommend trimming much of the tape off as there is often too much. Sometimes Band-Aids can irritate the skin.  You should call the office for your biopsy report after 1 week if you have not already been contacted.  If you experience any problems, such as abnormal amounts of bleeding, swelling, significant bruising, significant pain, or evidence of infection, please call the office immediately.  FOR ADULT SURGERY PATIENTS: If you need something for pain relief you may take 1 extra strength Tylenol (acetaminophen) AND 2 Ibuprofen (200mg  each) together every 4 hours as needed for pain. (do not take these if you are allergic to them or if you have a reason you should not take them.) Typically, you may only need pain medication for 1 to 3 days.    Dry Skin Care  What causes dry skin?  Dry skin is common and results from inadequate moisture in the outer skin layers. Dry skin usually results from the excessive loss of moisture from the skin surface. This occurs due to two major  factors: Normally the skin's oil glands deposit a layer of oil on the skin's surface. This layer of oil prevents the loss of moisture from the skin. Exposure to soaps, cleaners, solvents, and disinfectants removes this oily film, allowing water to escape. Water loss from the skin increases when the humidity is low. During winter months we spend a lot of time indoors where the air is heated. Heated air has very low humidity. This also contributes to dry skin.  A tendency for dry skin may accompany such disorders as eczema. Also, as people age, the number of functioning oil glands decreases, and the tendency toward dry skin can be a sensation of skin tightness when emerging from the shower.  How do I manage dry skin?  Humidify your environment. This can be accomplished by using a humidifier in your bedroom at night during winter months. Bathing can actually put moisture back into your skin if done right. Take the following steps while bathing to sooth dry skin: Avoid hot water, which only dries the skin and makes itching worse. Use warm water. Avoid washcloths or extensive rubbing or scrubbing. Use mild soaps like unscented Dove, Oil of Olay, Cetaphil, Basis, or CeraVe. If you take baths rather than showers, rinse off soap residue with clean water before getting out of tub. Once out of the shower/tub, pat dry gently with a soft towel. Leave your skin damp. While still damp, apply any medicated ointment/cream you were prescribed to the affected areas.  After you apply your medicated ointment/cream, then apply your moisturizer to your whole body.This is the most important step in dry skin care. If this is omitted, your skin will continue to be dry. The choice of moisturizer is also very important. In general, lotion will not provider enough moisture to severely dry skin because it is water based. You should use an ointment or cream. Moisturizers should also be unscented. Good choices include Vaseline  (plain petrolatum), Aquaphor, Cetaphil, CeraVe, Vanicream, DML Forte, Aveeno moisture, or Eucerin Cream. Bath oils can be helpful, but do not replace the application of moisturizer after the bath. In addition, they make the tub slippery causing an increased risk for falls. Therefore, we do not recommend their use.  If you have any questions or concerns for your doctor, please call our main line at [phone] and press option 4 to reach your doctor's medical assistant. If no one answers, please leave a voicemail as directed and we will return your call as soon as possible. Messages left after 4 pm will be answered the following business day.   You may also send [place] a message via [place]. We typically respond to MyChart messages within 1-2 business days.  For prescription refills, please ask your pharmacy to contact our office. Our fax number is [phone].  If you have an urgent issue when the clinic is closed that cannot wait until the next business day, you can page your doctor at the number below.    Please note that while we do our best to be available for urgent issues outside of office hours, we are not available 24/7.   If you have an urgent issue and are unable to reach [place], you may choose to seek medical care at your doctor's office, retail clinic, urgent care center, or emergency room.  If you have a medical emergency, please immediately call 911 or go to the emergency department.  Pager Numbers  - Dr. Nehemiah Massed: [phone]  - Dr. Laurence Ferrari: [phone]  - Dr. Nicole Kindred: [phone]  In the event of inclement weather, please call our main line at [phone] for an update on the status of any delays or closures.  Dermatology Medication Tips: Please keep the boxes that topical medications come in in order to help keep track of the instructions about where and how to use these. Pharmacies typically print the medication instructions only on the boxes and not directly on the  medication tubes.   If your medication is too expensive, please contact our office at [phone] option 4 or send [place] a message through [place].   We are unable to tell what your co-pay for medications will be in advance as this is different depending on your insurance coverage. However, we may be able to find a substitute medication at lower cost or fill out paperwork to get insurance to cover a needed medication.   If a prior authorization is required to get your medication covered by your insurance company, please allow [place] 1-2 business days to complete this process.  Drug prices often vary depending on where the prescription is filled and some pharmacies may offer cheaper prices.  The website www.goodrx.com contains coupons for medications through different pharmacies. The prices here do not account for what the cost may be with help from insurance (it may be cheaper with your insurance), but the website can give you the price if you did not use any insurance.  - You can print the associated coupon and take it with your prescription  to the pharmacy.  - You may also stop by our office during regular business hours and pick up a GoodRx coupon card.  - If you need your prescription sent electronically to a different pharmacy, notify our office through [facility] or by phone at [phone] option 4.

## 2020-11-28 ENCOUNTER — Other Ambulatory Visit: Payer: Self-pay

## 2020-11-28 ENCOUNTER — Telehealth: Payer: Self-pay

## 2020-11-28 ENCOUNTER — Ambulatory Visit: Payer: PPO | Admitting: Addiction (Substance Use Disorder)

## 2020-11-28 DIAGNOSIS — M71571 Other bursitis, not elsewhere classified, right ankle and foot: Secondary | ICD-10-CM | POA: Diagnosis not present

## 2020-11-28 DIAGNOSIS — L600 Ingrowing nail: Secondary | ICD-10-CM | POA: Diagnosis not present

## 2020-11-28 DIAGNOSIS — F319 Bipolar disorder, unspecified: Secondary | ICD-10-CM

## 2020-11-28 NOTE — Progress Notes (Signed)
      [facility] Counselor/Therapist Progress Note  Patient ID: Katherine James, MRN: [id number],    Date: 11/28/2020  Time Spent: 43mins  Treatment Type: Individual Therapy  Reported Symptoms: unsure  Mental Status Exam:  Appearance:   Casual and Well Groomed     Behavior:  Sharing  Motor:  Normal  Speech/Language:   Clear and Coherent  Affect:  Congruent and Labile  Mood:  labile and sad  Thought process:  normal  Thought content:    Obsessions and Rumination  Sensory/Perceptual disturbances:    Flashback  Orientation:  x4  Attention:  Fair  Concentration:  Good  Memory:  impaired  Fund of knowledge:   Good  Insight:    Fair  Judgment:   Fair  Impulse Control:  Fair   Risk Assessment: Danger to Self:  No Self-injurious Behavior: No Danger to Others: No Duty to Warn:no Physical Aggression / Violence:No  Access to Firearms a concern: No  Gang Involvement:No   Subjective: Client processed her issues with her "dancing eyes" and concerns it is all her medications. Client processed her desire to come off all her meds and her PT's encouragement of such; therapist warned client of risks of those changes and encouraged client to highly consider not changing a thing. Therapist also used MI & RPT with client to encourage client to continue taking her medications and using coping skills along with going to see her doctor to discuss her thoughts. Client also questioning her perceptions of her MH, moods, and her brain due to the "brain damage she has had". Therapist encouraged client to seek multiple wise counsel before making changes on the spur of the moment. Therapist used CBT to help client use rational thinking. Therapist assessed for safety and client denied SI/HI/AVH.   Interventions: Cognitive Behavioral Therapy, Motivational Interviewing, Psycho-education/Bibliotherapy, and RPT  Diagnosis:   ICD-10-CM   1. Bipolar I disorder ([place])  F31.9       Reports brain damage  results from EEG & Neurologist   Plan of Care: Client to return for weekly therapy with Sammuel Cooper, therapist, to review again in 6 months.  Client is to continue seeing medication provider for support of mood management.   Client to engage in CBT: challenging negative internal ruminations and self-talk AEB journaling daily or expressing thoughts to support persons in their life and then challenging it with truth.  Client to engage in tapping to tap in healthy cognition that challenges negative rumination or deep negative core belief.  Client to practice DBT distress tolerance skills to decrease crying spells and thoughts of not being able to endure their suffering AEB using mindfulness, deep breathing, and TIP to increase tolerance for discomfort, discharge emotional distress, and increase their understanding that they can do hard things.  Client to utilize BSP (brainspotting) with therapist to help client identify and process triggers for their depressive episode and anxiety with goal of reducing said SUDs caused by depression/anxiety by 33% in the next 6 months.  Client to prioritize sleep 8+ hours each week night AEB going to bed by 10pm each night.    Barnie Del, LCSW, LCAS, CCTP, CCS-I, BSP

## 2020-11-28 NOTE — Telephone Encounter (Signed)
-----   Message from Brendolyn Patty, MD sent at 11/25/2020  2:12 PM EDT ----- Skin , left lower sternum VERRUCA VULGARIS, ENDOPHYTIC TYPE  Benign wart, already treated with EDC at time of biopsy, LN2 if it recurs  - please call patient

## 2020-11-28 NOTE — Telephone Encounter (Signed)
Advised pt of bx results/sh ?

## 2020-12-05 ENCOUNTER — Other Ambulatory Visit: Payer: Self-pay | Admitting: Physician Assistant

## 2020-12-05 ENCOUNTER — Ambulatory Visit (INDEPENDENT_AMBULATORY_CARE_PROVIDER_SITE_OTHER): Payer: PPO | Admitting: Addiction (Substance Use Disorder)

## 2020-12-05 DIAGNOSIS — F4321 Adjustment disorder with depressed mood: Secondary | ICD-10-CM

## 2020-12-05 NOTE — Progress Notes (Signed)
[facility] Counselor/Therapist Progress Note  Patient ID: Courtnay Petrilla, MRN: [id number],    Date: 12/05/2020  Time Spent: 90mins  Treatment Type: Individual Therapy  Reported Symptoms: feeling abandoned   Mental Status Exam:  Appearance:   Casual and Well Groomed     Behavior:  Sharing  Motor:  Normal  Speech/Language:   Clear and Coherent  Affect:  NA  Mood:  sad  Thought process:  normal  Thought content:    Obsessions and Rumination  Sensory/Perceptual disturbances:    Flashback  Orientation:  x4  Attention:  Fair  Concentration:  Good  Memory:  impaired  Fund of knowledge:   Good  Insight:    Good  Judgment:   Fair  Impulse Control:  Fair   Risk Assessment: Danger to Self:  No Self-injurious Behavior: No Danger to Others: No Duty to Warn:no Physical Aggression / Violence:No  Access to Firearms a concern: No  Gang Involvement:No   Virtual Visit via TELEPHONE :  I connected with client by telephone, with their informed consent, and verified client privacy and that I am speaking with the correct person using two identifiers. I discussed the limitations, risks, security and privacy concerns of performing psychotherapy and management service virtually and confirmed their location. I also discussed with the patient that there may be a patient responsible charge related to this service and to confirm with the front desk if their insurance covers teletherapy. I also discussed with the patient the availability of in person appointments. The patient expressed understanding and agreed to proceed. I discussed the treatment planning with the client. The client was provided an opportunity to ask questions and all were answered. The client agreed with the plan and demonstrated an understanding of the instructions. The client was advised to call our office if symptoms worsen or feel they are in a crisis state and need immediate contact. Client also reminded of a crisis line  number and to use 9-1-1 if there's an emergency.  Therapist Location: office; Client Location: home.   Subjective: Client processed dealing with the ongoing grief of feeling alone in her life and like she looses friendships and relationships. Therapist used MI & grief therapy with client to validate her feeling while also helping to support her in processing the pain and grief around these times of loneliness. Client shared she is still feeling sad after the loss of her friend but also a bit empowered by finally understanding its not all her fault to have lost the relationship. Client made progress identifying feeling abandoned by her friend and also her siblings. Therapist used IFS with client to help her have compassion on herself and help her get curious about the triggers. Client identified Client made progress realizing that it was mostly the fault of her friend's dishonesty and blaming others. Client began to feel less like a child who didn't understand why her friends left her and felt more like a grown-up version of herself who reassured her child self that she wasn't to blame. Therapist assessed for safety and client denied SI/HI/AVH.   Interventions: Cognitive Behavioral Therapy, Motivational Interviewing, Psycho-education/Bibliotherapy, and RPT  Diagnosis:   ICD-10-CM   1. Bipolar I disorder ([place])  F31.9     2. Grief  F43.21      Reports brain damage results from EEG & Neurologist    Plan of Care: Client to return for weekly therapy with Sammuel Cooper, therapist, to review again in 6 months.  Client is  to continue seeing medication provider for support of mood management.   Client to engage in CBT: challenging negative internal ruminations and self-talk AEB journaling daily or expressing thoughts to support persons in their life and then challenging it with truth.  Client to engage in tapping to tap in healthy cognition that challenges negative rumination or deep negative core belief.   Client to practice DBT distress tolerance skills to decrease crying spells and thoughts of not being able to endure their suffering AEB using mindfulness, deep breathing, and TIP to increase tolerance for discomfort, discharge emotional distress, and increase their understanding that they can do hard things.  Client to utilize BSP (brainspotting) with therapist to help client identify and process triggers for their depressive episode and anxiety with goal of reducing said SUDs caused by depression/anxiety by 33% in the next 6 months.  Client to prioritize sleep 8+ hours each week night AEB going to bed by 10pm each night.    Barnie Del, LCSW, LCAS, CCTP, CCS-I, BSP

## 2020-12-06 ENCOUNTER — Ambulatory Visit (INDEPENDENT_AMBULATORY_CARE_PROVIDER_SITE_OTHER): Payer: PPO | Admitting: Physician Assistant

## 2020-12-06 ENCOUNTER — Encounter: Payer: Self-pay | Admitting: Physician Assistant

## 2020-12-06 DIAGNOSIS — F900 Attention-deficit hyperactivity disorder, predominantly inattentive type: Secondary | ICD-10-CM | POA: Diagnosis not present

## 2020-12-06 DIAGNOSIS — F4310 Post-traumatic stress disorder, unspecified: Secondary | ICD-10-CM | POA: Diagnosis not present

## 2020-12-06 DIAGNOSIS — F319 Bipolar disorder, unspecified: Secondary | ICD-10-CM | POA: Diagnosis not present

## 2020-12-06 MED ORDER — LITHIUM CARBONATE 150 MG PO CAPS: ORAL_CAPSULE | ORAL | 1 refills | Status: DC

## 2020-12-06 MED ORDER — LITHIUM CARBONATE 300 MG PO CAPS: ORAL_CAPSULE | ORAL | 1 refills | Status: DC

## 2020-12-06 MED ORDER — AMPHETAMINE-DEXTROAMPHETAMINE 20 MG PO TABS: 10.0000 mg | ORAL_TABLET | Freq: Two times a day (BID) | ORAL | 0 refills | Status: DC

## 2020-12-06 NOTE — Progress Notes (Signed)
[facility] Med Check  Patient ID: Katherine James,  MRN: [id number]  PCP: Melany Guernsey  Date of Evaluation: 12/06/2020  Time spent:30 minutes  Chief Complaint:  Chief Complaint   Depression; Follow-up      HISTORY/CURRENT STATUS: HPI For routine med check.  Doing really well now! 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 Still has chronic pain but even with that, feels better emotionally than she ever has.  Has learned some life lessons in counseling and from her life coach that have made her realize she wants to live and enjoy what is left of her life.  She enjoys things now.  Energy and motivation are good the vast majority of the time.  Does not cry easily.  She sleeps well.  Not isolating.  ADLs are normal, personal hygiene is normal.  Rarely has anxiety.  No suicidal or homicidal thoughts are reported.  Adderall was restarted recently.  Focus and concentration are better but she wonders if increasing the dose back to what she formally took would be a good idea.  She has tolerated this dose well but would like for it to be a little stronger.  She would like to decrease the lithium if at all possible.  Wants to discuss at the next visit, not consider it today.    Patient denies increased energy with decreased need for sleep, no increased talkativeness, no racing thoughts, no impulsivity or risky behaviors, no increased spending, no increased libido, no grandiosity, no increased irritability or anger, and no hallucinations.  Review of Systems  Constitutional: Negative.  Negative for malaise/fatigue.  HENT: Negative.    Eyes:        Oscillopsia  Respiratory: Negative.    Cardiovascular: Negative.   Gastrointestinal: Negative.   Genitourinary: Negative.   Musculoskeletal:  Positive for back pain and myalgias.  Skin: Negative.   Neurological:        Oscillopsia.  Sees provider [facility].  Endo/Heme/Allergies: Negative.   Psychiatric/Behavioral:          See HPI.   Individual Medical History/ Review of Systems: Changes? :No      Past medications for mental health diagnoses include: Seroquel, Lamictal, lithium, Provigil, Nuvigil, Adderall, mirtazapine, Celexa, Depakote, Wellbutrin, Prozac, Paxil, desipramine, [place] caused rash, had 2 treatments of Spravato with Dr. Toy Care   Allergies: Renee Harder [deutetrabenazine], Alprazolam, Amoxicillin-pot clavulanate, Celecoxib, Chlorpromazine hcl, Codeine, Depakote [divalproex sodium], Desipramine, Diclofenac sodium, Doxycycline, Etodolac, Fluoxetine hcl, Fluticasone propionate, Ibuprofen, Influenza vaccines, Iodine, Ketorolac tromethamine, Levofloxacin, Meperidine hcl, Nabumetone, Naproxen, Neurontin [gabapentin], Nsaids, Olive oil, Oxaprozin, Paroxetine, Phenergan [promethazine hcl], Piroxicam, Pneumococcal vaccines, Rofecoxib, Skelaxin [metaxalone], Sulfamethoxazole-trimethoprim, Sulfasalazine, Sulindac, Symbicort [budesonide-formoterol fumarate], Terfenadine, Tetracycline, Tramadol hcl, Wellbutrin [bupropion], Zinc, and Lisinopril  Current Medications:  Current Outpatient Medications:    amphetamine-dextroamphetamine (ADDERALL) 20 MG tablet, Take 0.5 tablets (10 mg total) by mouth 2 (two) times daily., Disp: 30 tablet, Rfl: 0   BELSOMRA 20 MG TABS, TAKE 1 TABLET BY MOUTH AT BEDTIME AS NEEDED, Disp: 30 tablet, Rfl: 0   carisoprodol (SOMA) 350 MG tablet, Take 3 tablets daily, Disp: , Rfl:    lamoTRIgine (LAMICTAL) 200 MG tablet, Take 2 tablets (400 mg total) by mouth daily., Disp: 180 tablet, Rfl: 1   levothyroxine (SYNTHROID) 50 MCG tablet, Take 50 mcg by mouth daily before breakfast., Disp: , Rfl:    LORazepam (ATIVAN) 0.5 MG tablet, TAKE 1/2 TO 1 TABLET BY MOUTH TWICE A DAY AS NEEDED FOR ANXIETY, Disp: 30 tablet, Rfl: 5  ondansetron (ZOFRAN) 4 MG tablet, TAKE 1 AND 1/2 TABLETS BY MOUTH DAILY FOR NAUSEA, Disp: 45 tablet, Rfl: 0   oxyCODONE (OXY IR/ROXICODONE) 5 MG immediate release tablet, Take 10 mg  daily as needed by mouth for severe pain or breakthrough pain. , Disp: , Rfl:    pregabalin (LYRICA) 25 MG capsule, Take 25 mg by mouth 2 (two) times daily. , Disp: , Rfl:    Pregnenolone Micronized (PREGNENOLONE PO), Take by mouth., Disp: , Rfl:    progesterone (PROMETRIUM) 200 MG capsule, Take 200 mg by mouth daily., Disp: , Rfl:    UNABLE TO FIND, DHEA SR (compound)- take 17 mg (1 tablet) daily except Saturday and Sunday, Disp: , Rfl:    UNABLE TO FIND, Hormone Topical Cream (Compound): Bi-Est (8:2) 1 mg, apply 1 gram every morning, Disp: , Rfl:    UNABLE TO FIND, Fentenyl Pain Patch, apply one 12 mcg patch every 3 days for chronic pain, Disp: , Rfl:    UNABLE TO FIND, Nature's Thryoid- take 3 g M, W and F, and 2 g every other day, Disp: , Rfl:    UNABLE TO FIND, Progesterone SR 300 mg (compound), take 1 tablet daily at bedtime, Disp: , Rfl:    UNABLE TO FIND, Seroquel (Compound)- 8.3 mg per mL, take 1 mL daily at bedtime, Disp: , Rfl:    UNABLE TO FIND, Pregnenolone 90 mg (compound), take 1 tablet daily, Disp: , Rfl:    UNABLE TO FIND, Testosterone Cream 0.5%- 1g/59mL, Disp: , Rfl:    hydrochlorothiazide (HYDRODIURIL) 25 MG tablet, TAKE 1 TABLET BY MOUTH ONCE DAILY. (Patient not taking: No sig reported), Disp: 30 tablet, Rfl: 1   lithium carbonate 150 MG capsule, 1 qd with the 300mg  (3)= 1,050mg  daily, Disp: 90 capsule, Rfl: 1   lithium carbonate 300 MG capsule, TAKE 3 CAPSULES BY MOUTH AT BEDTIME. TAKE WITH 150MG  CAPSULE, Disp: 270 capsule, Rfl: 1 Medication Side Effects: none  Family Medical/ Social History: Changes? No  MENTAL HEALTH EXAM:  There were no vitals taken for this visit.There is no height or weight on file to calculate BMI.  General Appearance:  unable to assess  Eye Contact:   unable to assess  Speech:  Clear and Coherent and Normal Rate  Volume:  Normal  Mood:  Euthymic  Affect:   unable to assess  Thought Process:  Goal Directed and Descriptions of Associations:  Intact  Orientation:  Full (Time, Place, and Person)  Thought Content: Logical   Suicidal Thoughts:  No  Homicidal Thoughts:  No  Memory:  WNL  Judgement:  Good  Insight:  Good  Psychomotor Activity:   Unable to assess    Concentration:  Concentration: Good and Attention Span: Poor  Recall:  Good  Fund of Knowledge: Good  Language: Good  Assets:  Desire for Improvement  ADL's:  Intact  Cognition: WNL  Prognosis:  Good   06/06/2020 Lithium 0.9  DIAGNOSES:    ICD-10-CM   1. Bipolar I disorder ([place])  F31.9     2. PTSD (post-traumatic stress disorder)  F43.10     3. Attention deficit hyperactivity disorder (ADHD), predominantly inattentive type  F90.0         Receiving Psychotherapy: Yes  Sammuel Cooper, LCSW and a lifecoach  RECOMMENDATIONS:  PDMP was reviewed.  Last [facility] 11/22/2020. Last Adderall 11/16/2020. Last Belsomra 11/11/2020. Last Oxy and Fentanyl on 11/07/2020. I provided 25 minutes of non-face-to-face time during this encounter, including time spent before and  after the visit in records review, medical decision making, and charting.  Doing really well except needs more energy and focus, she took a higher dose of Adderall in the past and wonders if that is possible now. No changes in meds are necessary otherwise.  She does want to discuss decreasing the lithium at the next visit.  Neither of [place] think now is a good time however. Increase Adderall to 20 mg, 1/2 bid prn. Continue Ativan 0.5 mg, 1/2-1 po bid prn. Rarely uses.  Continue Lamictal 200 mg, 1 po bid. Continue lithium 1050 mg/day (3x300+150 mg caps) Continue Belsomra 20 mg, 1 p.o. nightly as needed. Continue Seroquel 8.3 mg daily, she has compounded. Cont therapy with Sammuel Cooper, LCSW. Return in 2 months.  Donnal Moat, PA-C

## 2020-12-08 ENCOUNTER — Other Ambulatory Visit: Payer: Self-pay | Admitting: Physician Assistant

## 2020-12-09 NOTE — Telephone Encounter (Signed)
Last filled 9/16

## 2020-12-19 DIAGNOSIS — G894 Chronic pain syndrome: Secondary | ICD-10-CM | POA: Diagnosis not present

## 2020-12-22 ENCOUNTER — Ambulatory Visit (INDEPENDENT_AMBULATORY_CARE_PROVIDER_SITE_OTHER): Payer: PPO | Admitting: Addiction (Substance Use Disorder)

## 2020-12-22 DIAGNOSIS — F319 Bipolar disorder, unspecified: Secondary | ICD-10-CM

## 2020-12-22 DIAGNOSIS — F603 Borderline personality disorder: Secondary | ICD-10-CM

## 2020-12-22 NOTE — Progress Notes (Signed)
[facility] Counselor/Therapist Progress Note  Patient ID: Katherine James, MRN: [id number],    Date: 12/22/2020  Time Spent: 66min  Treatment Type: Individual Therapy  Reported Symptoms: triggered by thought of abandonment  Mental Status Exam:  Appearance:   Casual and Well Groomed     Behavior:  Sharing  Motor:  Normal  Speech/Language:   Clear and Coherent  Affect:  NA  Mood:  anxious, irritable, labile, and "triggered"  Thought process:  normal  Thought content:    Obsessions and Rumination  Sensory/Perceptual disturbances:    Flashback  Orientation:  x4  Attention:  Fair  Concentration:  Good  Memory:  impaired  Fund of knowledge:   Good  Insight:    Good  Judgment:   Fair  Impulse Control:  Fair   Risk Assessment: Danger to Self:  No Self-injurious Behavior: No Danger to Others: No Duty to Warn:no Physical Aggression / Violence:No  Access to Firearms a concern: No  Gang Involvement:No   Virtual Visit via TELEPHONE :  I connected with client by telephone, with their informed consent, and verified client privacy and that I am speaking with the correct person using two identifiers. I discussed the limitations, risks, security and privacy concerns of performing psychotherapy and management service virtually and confirmed their location. I also discussed with the patient that there may be a patient responsible charge related to this service and to confirm with the front desk if their insurance covers teletherapy. I also discussed with the patient the availability of in person appointments. The patient expressed understanding and agreed to proceed. I discussed the treatment planning with the client. The client was provided an opportunity to ask questions and all were answered. The client agreed with the plan and demonstrated an understanding of the instructions. The client was advised to call our office if symptoms worsen or feel they are in a crisis state and need  immediate contact. Client also reminded of a crisis line number and to use 9-1-1 if there's an emergency.  Therapist Location: office; Client Location: home.   Subjective: Client processed how the therapist asking about how things felt in her body triggered her. Client reported working on grounding but when it comes to emotional things, feeling overwhelmed by the visceral feedback of the trauma & grief and unable to regulate. Therapist used Mi & mindfulness to  validate clients feeling and normalize her reaction, while also helping client to ground now as they processed together about her experience. Therapist also encouraged client to practice DBT regulation skills to help her find ways to calm down when activated. Client insisted on asking to get brainspotting to process her feelings of abandonment but therapist explained that even before they began resourced, grounded brainspotting, the client was unable to continue processing it, so they will need to do some more resourcing and grounding first. Client was upset and explained why she wanted to "dive right in" but therapist explained she was to do no harm to any client. Therapist assessed for safety and client denied SI/HI/AVH.   Interventions: Dialectical Behavioral Therapy, Mindfulness Meditation, Motivational Interviewing, and RPT  Diagnosis:   ICD-10-CM   1. Bipolar I disorder (Katherine James)  F31.9     2. Borderline personality disorder (Katherine James)  F60.3         Plan of Care: Client to return for weekly therapy with Katherine James, therapist, to review again in 6 months.  Client is to continue seeing medication provider for support of  mood management.   Client to engage in CBT: challenging negative internal ruminations and self-talk AEB journaling daily or expressing thoughts to support persons in their life and then challenging it with truth.  Client to engage in tapping to tap in healthy cognition that challenges negative rumination or deep negative core  belief.  Client to practice DBT distress tolerance skills to decrease crying spells and thoughts of not being able to endure their suffering AEB using mindfulness, deep breathing, and TIP to increase tolerance for discomfort, discharge emotional distress, and increase their understanding that they can do hard things.  Client to utilize BSP (brainspotting) with therapist to help client identify and process triggers for their depressive episode and anxiety with goal of reducing said SUDs caused by depression/anxiety by 33% in the next 6 months.  Client to prioritize sleep 8+ hours each week night AEB going to bed by 10pm each night.    Barnie Del, LCSW, LCAS, CCTP, CCS-I, BSP

## 2020-12-28 DIAGNOSIS — M545 Low back pain, unspecified: Secondary | ICD-10-CM | POA: Diagnosis not present

## 2020-12-28 DIAGNOSIS — M542 Cervicalgia: Secondary | ICD-10-CM | POA: Diagnosis not present

## 2021-01-03 ENCOUNTER — Other Ambulatory Visit: Payer: Self-pay | Admitting: Physician Assistant

## 2021-01-03 ENCOUNTER — Telehealth: Payer: Self-pay | Admitting: Physician Assistant

## 2021-01-03 NOTE — Telephone Encounter (Signed)
Not needed. Error

## 2021-01-09 ENCOUNTER — Ambulatory Visit (INDEPENDENT_AMBULATORY_CARE_PROVIDER_SITE_OTHER): Payer: PPO | Admitting: Addiction (Substance Use Disorder)

## 2021-01-09 DIAGNOSIS — F319 Bipolar disorder, unspecified: Secondary | ICD-10-CM | POA: Diagnosis not present

## 2021-01-09 NOTE — Progress Notes (Signed)
[facility] Counselor/Therapist Progress Note  Patient ID: Katherine James, MRN: [id number],    Date: 01/09/2021  Time Spent: 51mins  Treatment Type: Individual Therapy  Reported Symptoms: hopeful about future without abandonment  Mental Status Exam:  Appearance:   Casual and Well Groomed     Behavior:  Sharing  Motor:  Normal  Speech/Language:   Clear and Coherent  Affect:  NA  Mood:  sad  Thought process:  normal  Thought content:    Obsessions and Rumination  Sensory/Perceptual disturbances:    Flashback  Orientation:  x4  Attention:  Fair  Concentration:  Good  Memory:  impaired  Fund of knowledge:   Good  Insight:    Good  Judgment:   Fair  Impulse Control:  Fair   Risk Assessment: Danger to Self:  No Self-injurious Behavior: No Danger to Others: No Duty to Warn:no Physical Aggression / Violence:No  Access to Firearms a concern: No  Gang Involvement:No   Virtual Visit via TELEPHONE : I connected with client by telephone, with their informed consent, and verified client privacy and that I am speaking with the correct person using two identifiers. I discussed the limitations, risks, security and privacy concerns of performing psychotherapy and management service virtually and confirmed their location. I also discussed with the patient that there may be a patient responsible charge related to this service and to confirm with the front desk if their insurance covers teletherapy. I also discussed with the patient the availability of in person appointments. The patient expressed understanding and agreed to proceed. I discussed the treatment planning with the client. The client was provided an opportunity to ask questions and all were answered. The client agreed with the plan and demonstrated an understanding of the instructions. The client was advised to call our office if symptoms worsen or feel they are in a crisis state and need immediate contact. Client also  reminded of a crisis line number and to use 9-1-1 if there's an emergency.  Therapist Location: office; Client Location: home.    Subjective: Client processed feeling a lot less depressed and stressed lately. Client recognizing a lot of her struggles to be around toxic relationships and ones where she has felt abandoned. Client feeling more comfortable around the thought of a broken relationship/friendship but still nervous about going through it again and is isolating more as a result. Client expressing her desire to see things differently and to challenge her irrational thoughts that put her in a tailspin. Client talking about ways to become more assertive in her relationships while also interested in doing further attachment work with the therapist. Therapist used MI & mindfulness to validate clients feelings, normalize her responses in different relationships & help her become more aware of her body to begin working towards brainspotting. Therapist assessed for safety and client denied SI/HI/AVH.   Interventions: Mindfulness Meditation, Motivational Interviewing, and RPT  Diagnosis:   ICD-10-CM   1. Bipolar I disorder ([place])  F31.9        Plan of Care: Client to return for weekly therapy with Sammuel Cooper, therapist, to review again in 6 months.  Client is to continue seeing medication provider for support of mood management.   Client to engage in CBT: challenging negative internal ruminations and self-talk AEB journaling daily or expressing thoughts to support persons in their life and then challenging it with truth.  Client to engage in tapping to tap in healthy cognition that challenges negative rumination or deep  negative core belief.  Client to practice DBT distress tolerance skills to decrease crying spells and thoughts of not being able to endure their suffering AEB using mindfulness, deep breathing, and TIP to increase tolerance for discomfort, discharge emotional distress, and increase  their understanding that they can do hard things.  Client to utilize BSP (brainspotting) with therapist to help client identify and process triggers for their depressive episode and anxiety with goal of reducing said SUDs caused by depression/anxiety by 33% in the next 6 months.  Client to prioritize sleep 8+ hours each week night AEB going to bed by 10pm each night.    Barnie Del, LCSW, LCAS, CCTP, CCS-I, BSP

## 2021-01-10 ENCOUNTER — Telehealth: Payer: Self-pay | Admitting: Physician Assistant

## 2021-01-10 NOTE — Telephone Encounter (Signed)
Please review

## 2021-01-10 NOTE — Telephone Encounter (Signed)
Pt called to ask if based on her next appt 12/21,is she supposed to have her lithium level checked.  If so, does [facility] have a standing order for her labs or does she need an order sent to them.  She needs to know if should take the lithium the night before?  Is she supposed to eat?  Pls call back and leave a detailed voice mail letting her know what needs to happen and what she needs to do.

## 2021-01-11 ENCOUNTER — Other Ambulatory Visit: Payer: Self-pay | Admitting: Physician Assistant

## 2021-01-11 DIAGNOSIS — Z79899 Other long term (current) drug therapy: Secondary | ICD-10-CM

## 2021-01-11 DIAGNOSIS — F319 Bipolar disorder, unspecified: Secondary | ICD-10-CM

## 2021-01-11 NOTE — Telephone Encounter (Signed)
LVM to rtc 

## 2021-01-11 NOTE — Telephone Encounter (Signed)
Pt informed please mail order

## 2021-01-11 NOTE — Telephone Encounter (Signed)
Please let her know I ordered the lithium level, and CMP to check mostly kidney and liver functions.  Lithium can affect the thyroid but I am not ordering that since someone else treats the hypothyroidism already. Yes she should take the lithium at the same time she always does.  Try to get the lab drawn approximately 12 hours after she takes it.  It does not matter if she is fasting or not. Please mail the orders to her so that she can take them to [facility].  It is supposed to be in their system but to be on the safe side, take those orders with her. Thank you.

## 2021-01-12 NOTE — Telephone Encounter (Signed)
This order was mailed to her on 11/16.

## 2021-01-16 ENCOUNTER — Ambulatory Visit (INDEPENDENT_AMBULATORY_CARE_PROVIDER_SITE_OTHER): Payer: PPO | Admitting: Addiction (Substance Use Disorder)

## 2021-01-16 ENCOUNTER — Other Ambulatory Visit: Payer: Self-pay

## 2021-01-16 DIAGNOSIS — R2681 Unsteadiness on feet: Secondary | ICD-10-CM | POA: Diagnosis not present

## 2021-01-16 DIAGNOSIS — N959 Unspecified menopausal and perimenopausal disorder: Secondary | ICD-10-CM | POA: Diagnosis not present

## 2021-01-16 DIAGNOSIS — D649 Anemia, unspecified: Secondary | ICD-10-CM | POA: Diagnosis not present

## 2021-01-16 DIAGNOSIS — H5500 Unspecified nystagmus: Secondary | ICD-10-CM | POA: Diagnosis not present

## 2021-01-16 DIAGNOSIS — F603 Borderline personality disorder: Secondary | ICD-10-CM | POA: Diagnosis not present

## 2021-01-16 DIAGNOSIS — E039 Hypothyroidism, unspecified: Secondary | ICD-10-CM | POA: Diagnosis not present

## 2021-01-16 DIAGNOSIS — R251 Tremor, unspecified: Secondary | ICD-10-CM | POA: Diagnosis not present

## 2021-01-16 DIAGNOSIS — E539 Vitamin B deficiency, unspecified: Secondary | ICD-10-CM | POA: Diagnosis not present

## 2021-01-16 DIAGNOSIS — F4321 Adjustment disorder with depressed mood: Secondary | ICD-10-CM | POA: Diagnosis not present

## 2021-01-16 DIAGNOSIS — R4182 Altered mental status, unspecified: Secondary | ICD-10-CM | POA: Diagnosis not present

## 2021-01-16 DIAGNOSIS — F319 Bipolar disorder, unspecified: Secondary | ICD-10-CM

## 2021-01-16 DIAGNOSIS — R5381 Other malaise: Secondary | ICD-10-CM | POA: Diagnosis not present

## 2021-01-16 DIAGNOSIS — D509 Iron deficiency anemia, unspecified: Secondary | ICD-10-CM | POA: Diagnosis not present

## 2021-01-17 LAB — COMPREHENSIVE METABOLIC PANEL
ALT: 20 IU/L (ref 0–32)
AST: 16 IU/L (ref 0–40)
Albumin/Globulin Ratio: 2.8 — ABNORMAL HIGH (ref 1.2–2.2)
Albumin: 5.3 g/dL — ABNORMAL HIGH (ref 3.8–4.9)
Alkaline Phosphatase: 126 IU/L — ABNORMAL HIGH (ref 44–121)
BUN/Creatinine Ratio: 17 (ref 9–23)
BUN: 15 mg/dL (ref 6–24)
Bilirubin Total: 0.2 mg/dL (ref 0.0–1.2)
CO2: 25 mmol/L (ref 20–29)
Calcium: 10.5 mg/dL — ABNORMAL HIGH (ref 8.7–10.2)
Chloride: 108 mmol/L — ABNORMAL HIGH (ref 96–106)
Creatinine, Ser: 0.88 mg/dL (ref 0.57–1.00)
Globulin, Total: 1.9 g/dL (ref 1.5–4.5)
Glucose: 100 mg/dL — ABNORMAL HIGH (ref 70–99)
Potassium: 4.8 mmol/L (ref 3.5–5.2)
Sodium: 144 mmol/L (ref 134–144)
Total Protein: 7.2 g/dL (ref 6.0–8.5)
eGFR: 76 mL/min/{1.73_m2} (ref >59)

## 2021-01-17 LAB — LITHIUM LEVEL: Lithium Lvl: 0.9 mmol/L (ref 0.5–1.2)

## 2021-01-17 NOTE — Progress Notes (Signed)
Patient notified

## 2021-01-17 NOTE — Progress Notes (Signed)
      [facility] Counselor/Therapist Progress Note  Patient ID: Katherine James, MRN: [id number],    Date: 01/17/2021  Time Spent: 17mins  Treatment Type: Individual Therapy  Reported Symptoms: sad, grieving   Mental Status Exam:  Appearance:   Casual and Well Groomed     Behavior:  Sharing  Motor:  Normal  Speech/Language:   Clear and Coherent  Affect:  Blunt and Tearful  Mood:  sad  Thought process:  normal  Thought content:    Obsessions and Rumination  Sensory/Perceptual disturbances:    Flashback  Orientation:  x4  Attention:  Fair  Concentration:  Good  Memory:  impaired  Fund of knowledge:   Good  Insight:    Good  Judgment:   Fair  Impulse Control:  Fair   Risk Assessment: Danger to Self:  No Self-injurious Behavior: No Danger to Others: No Duty to Warn:no Physical Aggression / Violence:No  Access to Firearms a concern: No  Gang Involvement:No   Subjective: Client processed  the grief welling up inside her and making her feel heavy and unable to see things straight with the grief around losing her friend. Client reported that after a long time of her friend's lack of engagement with her socially and verbally, client walled up and now feels "very differently" about her friend. Therapist used MI & bsp with client to help her process her somatic feelings being stored and stuck causing her more grief. Client made progress identifying the feelings so strong due to the fact that she lost her friend's support right after she lost her mom (& her friend knew her mom), so there is a complicated attachment with her. Client feeling more sure of her ability to de-pair the 2 things to give her room to move on. Therapist assessed for safety and client denied SI/HI/AVH.   Interventions: Mindfulness Meditation, Motivational Interviewing, and RPT  Diagnosis:   ICD-10-CM   1. Bipolar I disorder ([place])  F31.9     2. Grief  F43.21     3. Borderline personality disorder ([place])   F60.3         Plan of Care: Client to return for weekly therapy with Sammuel Cooper, therapist, to review again in 6 months.  Client is to continue seeing medication provider for support of mood management.   Client to engage in CBT: challenging negative internal ruminations and self-talk AEB journaling daily or expressing thoughts to support persons in their life and then challenging it with truth.  Client to engage in tapping to tap in healthy cognition that challenges negative rumination or deep negative core belief.  Client to practice DBT distress tolerance skills to decrease crying spells and thoughts of not being able to endure their suffering AEB using mindfulness, deep breathing, and TIP to increase tolerance for discomfort, discharge emotional distress, and increase their understanding that they can do hard things.  Client to utilize BSP (brainspotting) with therapist to help client identify and process triggers for their depressive episode and anxiety with goal of reducing said SUDs caused by depression/anxiety by 33% in the next 6 months.  Client to prioritize sleep 8+ hours each week night AEB going to bed by 10pm each night.    Barnie Del, LCSW, LCAS, CCTP, CCS-I, BSP

## 2021-01-17 NOTE — Progress Notes (Signed)
Katherine James please let her know that her lithium level is in a good range so no change in lithium dose. Kidney functions are normal.  She has a couple of labs that are slightly abnormal but are not significant at this time.  Her calcium level is creeping up a little bit but that can happen sometimes with the lithium.  We do need to watch that and I will repeat labs in 3 months. Thank you.

## 2021-01-24 ENCOUNTER — Telehealth: Payer: Self-pay | Admitting: Pulmonary Disease

## 2021-01-24 DIAGNOSIS — G4733 Obstructive sleep apnea (adult) (pediatric): Secondary | ICD-10-CM

## 2021-01-25 NOTE — Telephone Encounter (Signed)
LMTCB

## 2021-01-25 NOTE — Telephone Encounter (Signed)
Called patient. She stated that she has been using a backup cpap machine and it needs repairs. The call disconnected before she could go into detail. I attempted to call her back but the call went straight to VM. Will need to call her back tomorrow.

## 2021-01-26 ENCOUNTER — Ambulatory Visit: Payer: PPO | Admitting: Addiction (Substance Use Disorder)

## 2021-01-26 NOTE — Telephone Encounter (Signed)
Returned call to patient. She stated that she has actually has 2 cpap machines that need repairs. She is not sure where she needs to go for the repairs. She has been using her mother's cpap machine in the mean time. I looked at her chart and it looks like [facility] is her DME. I advised her that I could place an order for [facility] to service the machines but we would need an order and Dr. Juanetta Gosling permission. She verbalized understanding.   Dr. Halford Chessman, please advise if you are ok with [place] placing an order to [facility] for her repairs. Thanks!

## 2021-01-27 NOTE — Telephone Encounter (Signed)
Okay to place order to have [facility] see if they can repair her CPAP machines.

## 2021-01-27 NOTE — Telephone Encounter (Signed)
I sent order to [facility]  Pt aware and nothing further needed at this time

## 2021-01-30 ENCOUNTER — Telehealth: Payer: Self-pay | Admitting: Physician Assistant

## 2021-01-30 NOTE — Telephone Encounter (Signed)
Katherine James called and said that she has been taking Lithium 1050 mg and wants to know if she can decrease the mg dosage down in 25 mg increments? She would like to start this before seeing you back. She said her tardive dyskinesia is getting worse and thinks this would help. Her phone number is [phone].

## 2021-01-31 ENCOUNTER — Ambulatory Visit (INDEPENDENT_AMBULATORY_CARE_PROVIDER_SITE_OTHER): Payer: PPO | Admitting: Addiction (Substance Use Disorder)

## 2021-01-31 DIAGNOSIS — F603 Borderline personality disorder: Secondary | ICD-10-CM | POA: Diagnosis not present

## 2021-01-31 DIAGNOSIS — F319 Bipolar disorder, unspecified: Secondary | ICD-10-CM

## 2021-01-31 NOTE — Telephone Encounter (Signed)
Pt stated she has belsomra samples here that she wants shipped to her if possible and she can pay for shipping

## 2021-01-31 NOTE — Telephone Encounter (Signed)
Pt stated she decreased herself to 150 mg recently and will stay at that dose until her appt

## 2021-01-31 NOTE — Telephone Encounter (Signed)
Please review

## 2021-01-31 NOTE — Telephone Encounter (Signed)
I'll take care of that.

## 2021-01-31 NOTE — Telephone Encounter (Signed)
That's fine but the lowest dose is 150 mg caps. Even with the liquid, it will be almost impossible to decrease to 25 mg dose.  Does she want her to try it compounded?  If so, where did she want me to send it? Decreasing by 75 mg is not unreasonable and would be much easier to get that dose by opening up the capsule of the 150 mg, sprinkling as close to 1/2 of the contents as possible into a teaspoon of applesauce or yogurt or the like.

## 2021-01-31 NOTE — Telephone Encounter (Signed)
noted 

## 2021-01-31 NOTE — Progress Notes (Signed)
[facility] Counselor/Therapist Progress Note  Patient ID: Katherine James, MRN: [id number],    Date: 01/31/2021  Time Spent: 22mins  Treatment Type: Individual Therapy  Reported Symptoms: scared & upset  Mental Status Exam:  Appearance:   NA     Behavior:  Sharing  Motor:  Normal  Speech/Language:   Clear and Coherent  Affect:  NA & tearful  Mood:  anxious and sad  Thought process:  normal  Thought content:    Obsessions and Rumination  Sensory/Perceptual disturbances:    Flashback  Orientation:  x4  Attention:  Fair  Concentration:  Good  Memory:  impaired  Fund of knowledge:   Good  Insight:    Good  Judgment:   Fair  Impulse Control:  Fair   Risk Assessment: Danger to Self:  No Self-injurious Behavior: No Danger to Others: No Duty to Warn:no Physical Aggression / Violence:No  Access to Firearms a concern: No  Gang Involvement:No   Virtual Visit via TELEPHONE : I connected with client by telephone, with their informed consent, and verified client privacy and that I am speaking with the correct person using two identifiers. I discussed the limitations, risks, security and privacy concerns of performing psychotherapy and management service virtually and confirmed their location. I also discussed with the patient that there may be a patient responsible charge related to this service and to confirm with the front desk if their insurance covers teletherapy. I also discussed with the patient the availability of in person appointments. The patient expressed understanding and agreed to proceed. I discussed the treatment planning with the client. The client was provided an opportunity to ask questions and all were answered. The client agreed with the plan and demonstrated an understanding of the instructions. The client was advised to call our office if symptoms worsen or feel they are in a crisis state and need immediate contact. Client also reminded of a crisis line number  and to use 9-1-1 if there's an emergency.  Therapist Location: office; Client Location: home.   Subjective: Client processed her mental state and the spiral she's had neurologically. Client anxious that her diagnosis could be something big like seizures etc and inquired with therapist asking her what it could be, her mind racing. Therapist used SFT & encouraged client to discuss with her doctor and go back to the neurologist. Client reported her fear of being alone and bed ridden with her physical limitations; therapist used CBT with client to process her fear, but also her hope. Client processed through her obsessive thoughts about her friend Katherine James who she continually struggles with grief around. Client processed more of the step of grief she is in and therapist used grief therapy & MI with client to support her in processing and validate the step of grief she is in. Therapist assessed for safety and client denied SI/HI/AVH.   Interventions: Motivational Interviewing, Solution-Oriented/Positive Psychology, Grief Therapy, and RPT  Diagnosis:   ICD-10-CM   1. Bipolar I disorder ([place])  F31.9     2. Borderline personality disorder ([place])  F60.3        Plan of Care: Client to return for weekly therapy with Sammuel Cooper, therapist, to review again in 6 months.  Client is to continue seeing medication provider for support of mood management.   Client to engage in CBT: challenging negative internal ruminations and self-talk AEB journaling daily or expressing thoughts to support persons in their life and then challenging it with truth.  Client to engage in tapping to tap in healthy cognition that challenges negative rumination or deep negative core belief.  Client to practice DBT distress tolerance skills to decrease crying spells and thoughts of not being able to endure their suffering AEB using mindfulness, deep breathing, and TIP to increase tolerance for discomfort, discharge emotional distress, and  increase their understanding that they can do hard things.  Client to utilize BSP (brainspotting) with therapist to help client identify and process triggers for their depressive episode and anxiety with goal of reducing said SUDs caused by depression/anxiety by 33% in the next 6 months.  Client to prioritize sleep 8+ hours each week night AEB going to bed by 10pm each night.    Barnie Del, LCSW, LCAS, CCTP, CCS-I, BSP

## 2021-02-03 DIAGNOSIS — Z7689 Persons encountering health services in other specified circumstances: Secondary | ICD-10-CM | POA: Diagnosis not present

## 2021-02-06 ENCOUNTER — Other Ambulatory Visit: Payer: Self-pay | Admitting: Physician Assistant

## 2021-02-06 NOTE — Telephone Encounter (Signed)
Last filled 11/12

## 2021-02-15 ENCOUNTER — Encounter: Payer: Self-pay | Admitting: Physician Assistant

## 2021-02-15 ENCOUNTER — Ambulatory Visit (INDEPENDENT_AMBULATORY_CARE_PROVIDER_SITE_OTHER): Payer: PPO | Admitting: Physician Assistant

## 2021-02-15 DIAGNOSIS — F4310 Post-traumatic stress disorder, unspecified: Secondary | ICD-10-CM | POA: Diagnosis not present

## 2021-02-15 DIAGNOSIS — F319 Bipolar disorder, unspecified: Secondary | ICD-10-CM | POA: Diagnosis not present

## 2021-02-15 DIAGNOSIS — F900 Attention-deficit hyperactivity disorder, predominantly inattentive type: Secondary | ICD-10-CM

## 2021-02-15 DIAGNOSIS — G4733 Obstructive sleep apnea (adult) (pediatric): Secondary | ICD-10-CM

## 2021-02-15 DIAGNOSIS — G4700 Insomnia, unspecified: Secondary | ICD-10-CM | POA: Diagnosis not present

## 2021-02-15 DIAGNOSIS — H5500 Unspecified nystagmus: Secondary | ICD-10-CM

## 2021-02-15 MED ORDER — BELSOMRA 20 MG PO TABS: 1.0000 | ORAL_TABLET | Freq: Every evening | ORAL | 5 refills | Status: DC | PRN

## 2021-02-15 MED ORDER — LORAZEPAM 0.5 MG PO TABS: ORAL_TABLET | ORAL | 5 refills | Status: DC

## 2021-02-15 MED ORDER — LITHIUM CITRATE 300 MG/5 ML PO SYRP: 25.0000 mg | Freq: Every day | ORAL | 0 refills | Status: DC

## 2021-02-15 NOTE — Progress Notes (Signed)
[facility]  Patient ID: Katherine James,  MRN: [id number]  PCP: Melany Guernsey  Date of Evaluation: 02/15/2021  Time spent:40 minutes  Chief Complaint:  Chief Complaint   Anxiety; Depression; Insomnia; Follow-up      HISTORY/CURRENT STATUS: HPI For routine med check.  States she's had convulsions, 3 times in past 3 months. Twice they were 'little ones, but I had one big.' Lasted about 20 minutes, husband witnessed it. She was aware everything that went on, she jerked in her arms and legs, no trigger that she can tell, no loss of bowel or bladder control.  Did not seek medical attention.  She wants to continue going off Lithium. She already decreased it by 150 mg about 10 days ago. Thinks it has been causing the 'convulsions' and eye movement, has had neuro and ophthalmology work-up this past year.  She is very sensitive to medication changes so wants to go off extremely slowly.  Patient denies loss of interest in usual activities and is able to enjoy things.  Denies decreased energy or motivation.  Appetite has not changed.  No extreme sadness, tearfulness, or feelings of hopelessness.  Sleeps fairly well.  The Belsomra is helpful.  Uses her CPAP.  Denies suicidal or homicidal thoughts.   Not taking the Adderall. Concerned about increased anxiety. Feels like needs Ativan more than needs help focusing.   Patient denies increased energy with decreased need for sleep, no increased talkativeness, no racing thoughts, no impulsivity or risky behaviors, no increased spending, no increased libido, no grandiosity, no increased irritability or anger, and no hallucinations.  Review of Systems  Constitutional: Negative.   HENT: Negative.    Eyes:  Positive for blurred vision.       Secondary to nystagmus  Respiratory: Negative.    Cardiovascular: Negative.   Gastrointestinal: Negative.   Genitourinary: Negative.   Musculoskeletal: Negative.   Skin: Negative.   Neurological:  Negative.   Endo/Heme/Allergies: Negative.   Psychiatric/Behavioral:         See HPI    Individual Medical History/ Review of Systems: Changes? :Yes she is scheduled for blepharoplasty next month. Has seen GI for doctor for diverticulitis since the last visit.  Past medications for mental health diagnoses include: Seroquel, Lamictal, lithium, Provigil, Nuvigil, Adderall, mirtazapine, Celexa, Depakote, Wellbutrin, Prozac, Paxil, desipramine, Austedo caused rash, had 2 treatments of Spravato with Dr. Toy Care   Allergies: Renee Harder [deutetrabenazine], Alprazolam, Amoxicillin-pot clavulanate, Celecoxib, Chlorpromazine hcl, Codeine, Depakote [divalproex sodium], Desipramine, Diclofenac sodium, Doxycycline, Etodolac, Fluoxetine hcl, Fluticasone propionate, Ibuprofen, Influenza vaccines, Iodine, Ketorolac tromethamine, Levofloxacin, Meperidine hcl, Nabumetone, Naproxen, Neurontin [gabapentin], Nsaids, Olive oil, Oxaprozin, Paroxetine, Phenergan [promethazine hcl], Piroxicam, Pneumococcal vaccines, Rofecoxib, Skelaxin [metaxalone], Sulfamethoxazole-trimethoprim, Sulfasalazine, Sulindac, Symbicort [budesonide-formoterol fumarate], Terfenadine, Tetracycline, Tramadol hcl, Wellbutrin [bupropion], Zinc, and Lisinopril  Current Medications:  Current Outpatient Medications:    carisoprodol (SOMA) 350 MG tablet, Take 3 tablets daily, Disp: , Rfl:    lamoTRIgine (LAMICTAL) 200 MG tablet, Take 2 tablets (400 mg total) by mouth daily., Disp: 180 tablet, Rfl: 1   levothyroxine (SYNTHROID) 50 MCG tablet, Take 50 mcg by mouth daily before breakfast., Disp: , Rfl:    lithium carbonate 300 MG capsule, TAKE 3 CAPSULES BY MOUTH AT BEDTIME. TAKE WITH 150MG  CAPSULE (Patient taking differently: 900 mg.), Disp: 270 capsule, Rfl: 1   lithium citrate 300 MG/5ML solution, Take 0.4 mLs (24 mg total) by mouth daily., Disp: 30 mL, Rfl: 0   ondansetron (ZOFRAN) 4 MG tablet, TAKE 1 AND 1/2 TABLETS BY  MOUTH DAILY FOR NAUSEA, Disp: 45  tablet, Rfl: 0   oxyCODONE (OXY IR/ROXICODONE) 5 MG immediate release tablet, Take 10 mg daily as needed by mouth for severe pain or breakthrough pain. , Disp: , Rfl:    Pregnenolone Micronized (PREGNENOLONE PO), Take by mouth., Disp: , Rfl:    progesterone (PROMETRIUM) 200 MG capsule, Take 200 mg by mouth daily., Disp: , Rfl:    UNABLE TO FIND, DHEA SR (compound)- take 17 mg (1 tablet) daily except Saturday and Sunday, Disp: , Rfl:    UNABLE TO FIND, Hormone Topical Cream (Compound): Bi-Est (8:2) 1 mg, apply 1 gram every morning, Disp: , Rfl:    UNABLE TO FIND, Fentenyl Pain Patch, apply one 12 mcg patch every 3 days for chronic pain, Disp: , Rfl:    UNABLE TO FIND, Nature's Thryoid- take 3 g M, W and F, and 2 g every other day, Disp: , Rfl:    UNABLE TO FIND, Progesterone SR 300 mg (compound), take 1 tablet daily at bedtime, Disp: , Rfl:    UNABLE TO FIND, Seroquel (Compound)- 8.3 mg per mL, take 1 mL daily at bedtime, Disp: , Rfl:    UNABLE TO FIND, Pregnenolone 90 mg (compound), take 1 tablet daily, Disp: , Rfl:    UNABLE TO FIND, Testosterone Cream 0.5%- 1g/58mL, Disp: , Rfl:    amphetamine-dextroamphetamine (ADDERALL) 20 MG tablet, TAKE 1/2 TABLET (10mg  TOTAL) BY MOUTH TWICE DAILY (Patient not taking: Reported on 02/15/2021), Disp: 30 tablet, Rfl: 0   hydrochlorothiazide (HYDRODIURIL) 25 MG tablet, TAKE 1 TABLET BY MOUTH ONCE DAILY. (Patient not taking: No sig reported), Disp: 30 tablet, Rfl: 1   lithium carbonate 150 MG capsule, 1 qd with the 300mg  (3)= 1,050mg  daily (Patient not taking: Reported on 02/15/2021), Disp: 90 capsule, Rfl: 1   LORazepam (ATIVAN) 0.5 MG tablet, TAKE 1/2 TO 1 TABLET BY MOUTH TWICE A DAY AS NEEDED FOR ANXIETY, Disp: 30 tablet, Rfl: 5   pregabalin (LYRICA) 25 MG capsule, Take 25 mg by mouth 2 (two) times daily.  (Patient not taking: Reported on 02/15/2021), Disp: , Rfl:    Suvorexant (BELSOMRA) 20 MG TABS, Take 1 tablet by mouth at bedtime as needed., Disp: 30 tablet,  Rfl: 5 Medication Side Effects: none  Family Medical/ Social History: Changes? No  MENTAL HEALTH EXAM:  There were no vitals taken for this visit.There is no height or weight on file to calculate BMI.  General Appearance: Casual and Fairly Groomed  Eye Contact:  Good  Speech:  Clear and Coherent and Normal Rate  Volume:  Normal  Mood:  Euthymic  Affect:  Congruent  Thought Process:  Goal Directed and Descriptions of Associations: Intact  Orientation:  Full (Time, Place, and Person)  Thought Content: Logical   Suicidal Thoughts:  No  Homicidal Thoughts:  No  Memory:  WNL  Judgement:  Good  Insight:  Good  Psychomotor Activity:  Normal and has bilateral nystagmus    Concentration:  Concentration: Good and Attention Span: Poor  Recall:  Good  Fund of Knowledge: Good  Language: Good  Assets:  Desire for Improvement  ADL's:  Intact  Cognition: WNL  Prognosis:  Good   Lithium 01/16/2021 0.9 CMP Glucose 100, calcium 10.5, chloride 108, LFTs normal, BUN and creatinine normal  DIAGNOSES:    ICD-10-CM   1. Bipolar I disorder ([place])  F31.9     2. PTSD (post-traumatic stress disorder)  F43.10     3. Attention deficit hyperactivity disorder (ADHD),  predominantly inattentive type  F90.0     4. Insomnia, unspecified type  G47.00     5. Obstructive sleep apnea  G47.33     6. Nystagmus  H55.00          Receiving Psychotherapy: Yes  Sammuel Cooper, LCSW and a lifecoach  RECOMMENDATIONS:  PDMP was reviewed.  Last Adderall filled 02/07/2021.  Last Belsomra filled 01/07/2021.  Oxycodone, fentanyl, and Soma known to me. I provided 40 minutes of face to face time during this encounter, including time spent before and after the visit in records review, medical decision making, counseling pertinent to today's visit, and charting.  We discussed the Ativan.  I agree that it is more important to treat the anxiety right now than the ADD. Discussed the lithium.  It is fine to gradually  wean off.  The only way to decrease less than 150 mg each, is to have it compounded which she prefers. Discontinue Adderall. Continue Ativan 0.5 mg, 1/2-1 po bid prn.  Continue Lamictal 200 mg, 1 po bid. Decrease lithium from 900 mg to 825 mg for several weeks then down to 750 mg daily. Continue Belsomra 20 mg, 1 p.o. nightly as needed. Continue Seroquel 8.3 mg daily, she has compounded. Cont therapy with Sammuel Cooper, LCSW. Return in 4-6 wks.   Donnal Moat, PA-C

## 2021-02-16 ENCOUNTER — Telehealth: Payer: Self-pay | Admitting: Physician Assistant

## 2021-02-16 NOTE — Telephone Encounter (Signed)
Katherine James, with [employer], called because the lithium citrate solution is no longer available commercially.  She wants to know if they can substitute lithium carbonate in an equivalent dose as a suspension.  Please call to approve this change.  [phone]

## 2021-02-16 NOTE — Telephone Encounter (Signed)
Notified Gwinda Passe that it was okay to make the substitution.

## 2021-02-16 NOTE — Telephone Encounter (Signed)
Yes, that's fine. Thanks!

## 2021-03-01 ENCOUNTER — Telehealth: Payer: Self-pay | Admitting: Physician Assistant

## 2021-03-01 NOTE — Telephone Encounter (Signed)
Please call Katherine James and let her know I got the Gene site test results back.  For the depression, results indicate that Pristiq, Zoloft, Viibryd, [place], Emsam will be effective at recommended FDA doses.  I recommend Pristiq, I cannot find where she has ever taken that before.  Possible side effects are GI upset, headache, dizziness, somnolence, and of course I would start at a very low dose and increase as tolerated.  Since she was doing pretty well at the last visit as far as her mood goes, I would prefer not to make any changes at this time though because we are gradually weaning off of the lithium.  Let her know I can go over these in detail with her when we see each other next month.

## 2021-03-02 ENCOUNTER — Ambulatory Visit: Payer: PPO | Admitting: Addiction (Substance Use Disorder)

## 2021-03-02 DIAGNOSIS — F339 Major depressive disorder, recurrent, unspecified: Secondary | ICD-10-CM | POA: Insufficient documentation

## 2021-03-02 DIAGNOSIS — Z87820 Personal history of traumatic brain injury: Secondary | ICD-10-CM | POA: Insufficient documentation

## 2021-03-02 DIAGNOSIS — R4189 Other symptoms and signs involving cognitive functions and awareness: Secondary | ICD-10-CM | POA: Insufficient documentation

## 2021-03-02 NOTE — Telephone Encounter (Signed)
Called patient and provided her the GeneSight information. She said she was not going to make a change in medications at this point. She was concerned about issues with Lamictal and/or lithium. She asked if she could get a copy of the report and I told her we could give her a copy at her appt the end of the month. She was in agreement with the plan.

## 2021-03-02 NOTE — Telephone Encounter (Signed)
LVM to RC 

## 2021-03-02 NOTE — Telephone Encounter (Signed)
Noted  

## 2021-03-06 ENCOUNTER — Other Ambulatory Visit: Payer: Self-pay | Admitting: Physician Assistant

## 2021-03-07 ENCOUNTER — Telehealth: Payer: Self-pay | Admitting: Physician Assistant

## 2021-03-07 ENCOUNTER — Other Ambulatory Visit: Payer: Self-pay

## 2021-03-07 ENCOUNTER — Encounter: Payer: Self-pay | Admitting: Physician Assistant

## 2021-03-07 NOTE — Telephone Encounter (Signed)
LVM to try to get more information.

## 2021-03-07 NOTE — Telephone Encounter (Signed)
Please see if she got the compounded Lithium, is that what she's talking about?  It was sent in for this reason, ease of weaning down.  If I had known she was going to split up the lithium tabs into small pieces, I would not have sent in the compounded dose.  If she is splitting up the 300 mg tablets then it is okay to go ahead and send in 30 pills.  Continue a total of 875 mg daily.  Thank you.

## 2021-03-07 NOTE — Telephone Encounter (Signed)
Pt LVM stating that she was denied the 300mg  Lithium med.  She said she divides it into 1/4th to get to 75mg .  Next appt 1/30

## 2021-03-07 NOTE — Telephone Encounter (Signed)
Noted  

## 2021-03-07 NOTE — Telephone Encounter (Signed)
Patient called about her 300 mg lithium tablets. Refill was refused. She said she quartered the tablets to get the correct dosing. She is tapering down but she said you gave her wiggle room to adjust the decrease and she is not able to decrease as much at one time as she should. She said she has 7 quarters left. She is requesting more tablets. She said she is having surgery soon and doesn't feel she can reduce dosage as much as she has been previously. She is taking 875 mg total currently.

## 2021-03-07 NOTE — Telephone Encounter (Signed)
Patient is taking two 300 mg capsules, one 150 mg capsule, 0.8 mL of liquid, and 1/4 of a 300 mg tablet to get the 875 mg dose. She said the liquid decreases her more quickly than she is currently able to handle.  A script sent for 300 mg tablets.

## 2021-03-09 ENCOUNTER — Ambulatory Visit: Payer: PPO | Admitting: Addiction (Substance Use Disorder)

## 2021-03-09 MED ORDER — LITHIUM CARBONATE 300 MG PO CAPS: 900.0000 mg | ORAL_CAPSULE | Freq: Every evening | ORAL | 0 refills | Status: DC

## 2021-03-09 NOTE — Telephone Encounter (Signed)
The directions on the prescription will be for a total of 900 mg per night.  But have her continue 875 mg as she has been taking.

## 2021-03-16 ENCOUNTER — Other Ambulatory Visit: Payer: Self-pay | Admitting: Physician Assistant

## 2021-03-16 ENCOUNTER — Telehealth: Payer: Self-pay | Admitting: Physician Assistant

## 2021-03-16 ENCOUNTER — Other Ambulatory Visit: Payer: Self-pay

## 2021-03-16 MED ORDER — LITHIUM CARBONATE 300 MG PO TABS: 300.0000 mg | ORAL_TABLET | Freq: Three times a day (TID) | ORAL | 0 refills | Status: DC

## 2021-03-16 NOTE — Telephone Encounter (Signed)
Rx sent 

## 2021-03-16 NOTE — Telephone Encounter (Signed)
Please resend the Lith Carbonate tablets in for her. She can't use the capsules because she has to break them up. See note from 03/07/21

## 2021-03-20 ENCOUNTER — Ambulatory Visit: Payer: PPO | Admitting: Addiction (Substance Use Disorder)

## 2021-03-23 ENCOUNTER — Other Ambulatory Visit: Payer: Self-pay

## 2021-03-23 ENCOUNTER — Other Ambulatory Visit: Payer: Self-pay | Admitting: Psychiatry

## 2021-03-24 ENCOUNTER — Other Ambulatory Visit: Payer: Self-pay | Admitting: Psychiatry

## 2021-03-24 MED ORDER — AMPHETAMINE-DEXTROAMPHETAMINE 20 MG PO TABS: 10.0000 mg | ORAL_TABLET | Freq: Two times a day (BID) | ORAL | 0 refills | Status: DC

## 2021-03-24 NOTE — Telephone Encounter (Signed)
Pmp shows filled 01/07/21

## 2021-03-27 ENCOUNTER — Encounter: Payer: Self-pay | Admitting: Physician Assistant

## 2021-03-27 ENCOUNTER — Ambulatory Visit (INDEPENDENT_AMBULATORY_CARE_PROVIDER_SITE_OTHER): Payer: PPO | Admitting: Physician Assistant

## 2021-03-27 DIAGNOSIS — F319 Bipolar disorder, unspecified: Secondary | ICD-10-CM

## 2021-03-27 DIAGNOSIS — H5500 Unspecified nystagmus: Secondary | ICD-10-CM

## 2021-03-27 DIAGNOSIS — F900 Attention-deficit hyperactivity disorder, predominantly inattentive type: Secondary | ICD-10-CM | POA: Diagnosis not present

## 2021-03-27 DIAGNOSIS — F32A Depression, unspecified: Secondary | ICD-10-CM

## 2021-03-27 DIAGNOSIS — F4310 Post-traumatic stress disorder, unspecified: Secondary | ICD-10-CM | POA: Diagnosis not present

## 2021-03-27 NOTE — Progress Notes (Signed)
[facility]  Patient ID: Katherine James,  MRN: [id number]  PCP: Melany Guernsey  Date of Evaluation: 03/27/2021 Time spent:25 minutes  Chief Complaint:  Chief Complaint   Anxiety; Depression; ADD; Insomnia; Follow-up    Virtual Visit via Telehealth  I connected with patient by telephone, with their informed consent, and verified patient privacy and that I am speaking with the correct person using two identifiers.  I am private, in my office and the patient is at home.  I discussed the limitations, risks, security and privacy concerns of performing an evaluation and management service by telephone  and the availability of in person appointments. I also discussed with the patient that there may be a patient responsible charge related to this service. The patient expressed understanding and agreed to proceed.   I discussed the assessment and treatment plan with the patient. The patient was provided an opportunity to ask questions and all were answered. The patient agreed with the plan and demonstrated an understanding of the instructions.   The patient was advised to call back or seek an in-person evaluation if the symptoms worsen or if the condition fails to improve as anticipated.  I provided 25 minutes of non-face-to-face time during this encounter.   HISTORY/CURRENT STATUS: HPI For routine med check.  Weaning Lithium VERY slowly.  Total of 875 mg day. States she's med sensitive and has to do things really slow. "I'm a slow person when it comes to drugs, either getting on or off." Not ready to go down yet. Feels like the Lithium is causing the nystagmus but no one really knows.  She has seen several specialists about it and it seems that the lithium is the cause.  States she is feeling very tired, not sure that it is the decreased dose of lithium that is the problem or the eyedrops are making her feel tired.  She has only decreased by 25 mg and that was 2 to 3 weeks ago.   She is able to enjoy things, still enjoys writing.  ADLs are within normal limits.  Personal hygiene is normal.  Does not cry easily.  No suicidal or homicidal thoughts.  Patient denies increased energy with decreased need for sleep, no increased talkativeness, no racing thoughts, no impulsivity or risky behaviors, no increased spending, no increased libido, no grandiosity, no increased irritability or anger, and no hallucinations.  Not as anxious now that Christmas is over.  Not needing the Ativan very often at all.  I had documented for it to be discontinued since she was needing the Ativan more often during the holidays but now is back on the Adderall routinely.  She needs it to help with focus, which it does, and energy.  Still has trouble with double vision and the nystagmus.  But it is no worse and is not better either. Denies dizziness, syncope, seizures, numbness, tingling, tremor, tics, unsteady gait, slurred speech, confusion. Denies dystonia.  Has fibromyalgia with chronic diffuse muscle aches and pains.  Individual Medical History/ Review of Systems: Changes? :Yes   had surgery last week to elevate her upper eyelids bilaterally.  Past medications for mental health diagnoses include: Seroquel, Lamictal, lithium, Provigil, Nuvigil, Adderall, mirtazapine, Celexa, Depakote, Wellbutrin, Prozac, Paxil, desipramine, Austedo caused rash, had 2 treatments of Spravato with Dr. Toy Care   Allergies: Renee Harder [deutetrabenazine], Alprazolam, Amoxicillin-pot clavulanate, Celecoxib, Chlorpromazine hcl, Codeine, Depakote [divalproex sodium], Desipramine, Diclofenac sodium, Doxycycline, Etodolac, Fluoxetine hcl, Fluticasone propionate, Ibuprofen, Influenza vaccines, Iodine, Ketorolac tromethamine, Levofloxacin, Meperidine hcl, Nabumetone,  Naproxen, Neurontin [gabapentin], Nsaids, Olive oil, Oxaprozin, Paroxetine, Phenergan [promethazine hcl], Piroxicam, Pneumococcal vaccines, Rofecoxib, Skelaxin [metaxalone],  Sulfamethoxazole-trimethoprim, Sulfasalazine, Sulindac, Symbicort [budesonide-formoterol fumarate], Terfenadine, Tetracycline, Tramadol hcl, Wellbutrin [bupropion], Zinc, and Lisinopril  Current Medications:  Current Outpatient Medications:    amphetamine-dextroamphetamine (ADDERALL) 20 MG tablet, Take 0.5 tablets (10 mg total) by mouth 2 (two) times daily., Disp: 30 tablet, Rfl: 0   carisoprodol (SOMA) 350 MG tablet, Take 3 tablets daily, Disp: , Rfl:    lamoTRIgine (LAMICTAL) 200 MG tablet, TAKE 2 TABLETS BY MOUTH EVERY DAY, Disp: 180 tablet, Rfl: 1   levothyroxine (SYNTHROID) 50 MCG tablet, Take 50 mcg by mouth daily before breakfast., Disp: , Rfl:    lithium 300 MG tablet, Take 1 tablet (300 mg total) by mouth 3 (three) times daily., Disp: 90 tablet, Rfl: 0   lithium carbonate 300 MG capsule, Take 3 capsules (900 mg total) by mouth at bedtime. (Patient taking differently: Take 900 mg by mouth at bedtime. 875 mg), Disp: 270 capsule, Rfl: 0   lithium citrate 300 MG/5ML solution, Take 0.4 mLs (24 mg total) by mouth daily., Disp: 30 mL, Rfl: 0   LORazepam (ATIVAN) 0.5 MG tablet, TAKE 1/2 TO 1 TABLET BY MOUTH TWICE A DAY AS NEEDED FOR ANXIETY, Disp: 30 tablet, Rfl: 5   ondansetron (ZOFRAN) 4 MG tablet, TAKE 1 AND 1/2 TABLETS BY MOUTH DAILY FOR NAUSEA, Disp: 45 tablet, Rfl: 0   oxyCODONE (OXY IR/ROXICODONE) 5 MG immediate release tablet, Take 10 mg daily as needed by mouth for severe pain or breakthrough pain. , Disp: , Rfl:    Pregnenolone Micronized (PREGNENOLONE PO), Take by mouth., Disp: , Rfl:    Suvorexant (BELSOMRA) 20 MG TABS, Take 1 tablet by mouth at bedtime as needed., Disp: 30 tablet, Rfl: 5   UNABLE TO FIND, DHEA SR (compound)- take 17 mg (1 tablet) daily except Saturday and Sunday, Disp: , Rfl:    UNABLE TO FIND, Hormone Topical Cream (Compound): Bi-Est (8:2) 1 mg, apply 1 gram every morning, Disp: , Rfl:    UNABLE TO FIND, Fentenyl Pain Patch, apply one 12 mcg patch every 3 days for  chronic pain, Disp: , Rfl:    UNABLE TO FIND, Nature's Thryoid- take 3 g M, W and F, and 2 g every other day, Disp: , Rfl:    UNABLE TO FIND, Progesterone SR 300 mg (compound), take 1 tablet daily at bedtime, Disp: , Rfl:    UNABLE TO FIND, Seroquel (Compound)- 8.3 mg per mL, take 1 mL daily at bedtime, Disp: , Rfl:    UNABLE TO FIND, Pregnenolone 90 mg (compound), take 1 tablet daily, Disp: , Rfl:    UNABLE TO FIND, Testosterone Cream 0.5%- 1g/82mL, Disp: , Rfl:    hydrochlorothiazide (HYDRODIURIL) 25 MG tablet, TAKE 1 TABLET BY MOUTH ONCE DAILY. (Patient not taking: Reported on 05/30/2020), Disp: 30 tablet, Rfl: 1   pregabalin (LYRICA) 25 MG capsule, Take 25 mg by mouth 2 (two) times daily.  (Patient not taking: Reported on 02/15/2021), Disp: , Rfl:    progesterone (PROMETRIUM) 200 MG capsule, Take 200 mg by mouth daily. (Patient not taking: Reported on 03/27/2021), Disp: , Rfl:  Medication Side Effects: none  Family Medical/ Social History: Changes? No  MENTAL HEALTH EXAM:  There were no vitals taken for this visit.There is no height or weight on file to calculate BMI.  General Appearance:  Unable to assess  Eye Contact:   Unable to assess  Speech:  Clear and Coherent  and Normal Rate  Volume:  Normal  Mood:  Euthymic  Affect:   Unable to assess  Thought Process:  Goal Directed and Descriptions of Associations: Intact  Orientation:  Full (Time, Place, and Person)  Thought Content: Logical   Suicidal Thoughts:  No  Homicidal Thoughts:  No  Memory:  WNL  Judgement:  Good  Insight:  Good  Psychomotor Activity:   Unable to assess    Concentration:  Concentration: Good and Attention Span: Poor  Recall:  Good  Fund of Knowledge: Good  Language: Good  Assets:  Desire for Improvement  ADL's:  Intact  Cognition: WNL  Prognosis:  Good   Gene site test results are scanned in chart under labs 02/22/2021.  Had reduced folic acid conversion.  Neuropsychological testing results in chart  under "Care Everywhere" 02/28/2021 Assessment summary was intact neurocognitive abilities.  Premorbid intellectual functioning high average.  Intact performance of attention, processing speed, language, visual construction, memory, executive function, relative strength was observed on task of attention and speeded word generation.  Diagnosis and conclusions were multiple concussions, MDD, cluster B personality features, rule out borderline personality disorder.  DBT was recommended.   DIAGNOSES:    ICD-10-CM   1. Bipolar I disorder ([place])  F31.9     2. PTSD (post-traumatic stress disorder)  F43.10     3. Attention deficit hyperactivity disorder (ADHD), predominantly inattentive type  F90.0     4. Nystagmus  H55.00     5. Melancholy  F32.A       Receiving Psychotherapy: Yes  Sammuel Cooper, LCSW in the past  RECOMMENDATIONS:  PDMP was reviewed.  Last Adderall filled 03/24/2021. Ativan 03/23/2021, Belsomra filled 03/16/2021. Oxycodone, fentanyl, and Soma known to me. I provided 25 minutes of non-face-to-face time during this encounter, including time spent before and after the visit in records review, medical decision making, counseling pertinent to today's visit, and charting.  We discussed the results of the neuropsychological testing.  Results do not point toward bipolar disorder but in my opinion she does have it.  More towards the bipolar 2 spectrum but has had mania in the distant past.  There is no treatment changed because of the results of the testing.  DBT is recommended.  I recommend she contact  behavioral health but she would like to stay in office if possible.  Patient states she was seeing Sammuel Cooper but "she is leaving" so she canceled all future appointments with her.  That is unknown to me, I know that she has gone part-time.  I will discuss this with her and see if Katherine Cloud, LCSW is accepting new patients.  Katherine James is aware that it may take several months to get in, she is  okay with that. Patient may decide to decrease the lithium further before our next appointment, which is fine.  She will go down by 50 mg and stay at that dose until our next appointment. Continue lithium 875 mg daily. Continue Adderall 20 mg, one half p.o. twice a day as needed. Continue Ativan 0.5 mg, 1/2-1 po bid prn.  Use sparingly. Continue Lamictal 200 mg, 1 po bid. Continue Belsomra 20 mg, 1 p.o. nightly as needed. Continue Seroquel 8.3 mg daily, she has compounded. Return in 6 wks.   Donnal Moat, PA-C

## 2021-04-07 ENCOUNTER — Other Ambulatory Visit: Payer: Self-pay | Admitting: Physician Assistant

## 2021-04-07 ENCOUNTER — Telehealth: Payer: Self-pay | Admitting: Physician Assistant

## 2021-04-07 ENCOUNTER — Other Ambulatory Visit: Payer: Self-pay

## 2021-04-07 MED ORDER — LITHIUM CITRATE 300 MG/5 ML PO SYRP: 25.0000 mg | Freq: Every day | ORAL | 1 refills | Status: DC

## 2021-04-07 MED ORDER — LITHIUM CARBONATE 300 MG PO TABS: 300.0000 mg | ORAL_TABLET | Freq: Three times a day (TID) | ORAL | 0 refills | Status: DC

## 2021-04-07 NOTE — Telephone Encounter (Signed)
Prescription was sent already for a larger quantity.

## 2021-04-07 NOTE — Telephone Encounter (Signed)
Pt is calling today to request a RF on her lithium thru [facility], but also wants it to be a larger amount in the bottle each time. She is about out and didn't realize it. Can we send in today? [place], [place]

## 2021-04-07 NOTE — Telephone Encounter (Signed)
For lithium refill- It needs to be the lithium citrate solution to send to [facility].  Please check with patient on what she needs b/c the pharmacy needs the dose to read a larger amount so that she gets enough in the bottle.

## 2021-04-07 NOTE — Telephone Encounter (Signed)
Quantity of 60 cc sent. I understand this is a confusing dose.  Patient states she is extremely sensitive to medication so we are weaning her off very, very slowly.  She takes this liquid along with her pills to equal 875 mg dose daily for now.

## 2021-04-07 NOTE — Telephone Encounter (Signed)
Rx sent for 90 day supply.

## 2021-04-07 NOTE — Telephone Encounter (Signed)
Katherine James let me know she is suppose to be taking a liquid form.Pt wanted me to call her and discuss a higher dose,LVM to rtc

## 2021-04-07 NOTE — Telephone Encounter (Signed)
Addressed in another message ,spoke to pt

## 2021-04-07 NOTE — Telephone Encounter (Signed)
LVM to rtc 

## 2021-04-07 NOTE — Telephone Encounter (Signed)
The pharmacy called,we both are confused on the dose.The note stated continue 825 mg.I sent in a rx for lithium 300MG  tablets TID.They stated they filled a liquid rx for lithium as well lithium citrate 300 mg solution Take 0.4 mLs (24 mg total) by mouth daily in December.What exactly should she be taking ?

## 2021-04-16 IMAGING — US US THYROID
1 series · 14 of 25 positions shown · non-contrast
Comparison: 11/21/2016; 11/28/2015 12/30/2014; 06/22/2014;
11/06/2013

CLINICAL DATA: Palpable abnormality. Thyromegaly on physical
examination.

EXAM:
THYROID ULTRASOUND
TECHNIQUE: Ultrasound examination of the thyroid gland and adjacent soft
tissues was performed.

[Series 1: us thyroid · 0.07mm/px · 14 of 25 slices shown]
[im 1/25]
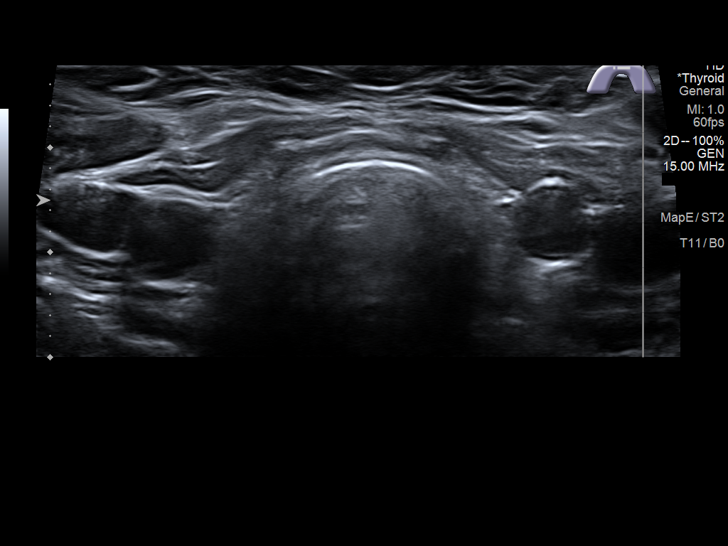
[im 3/25]
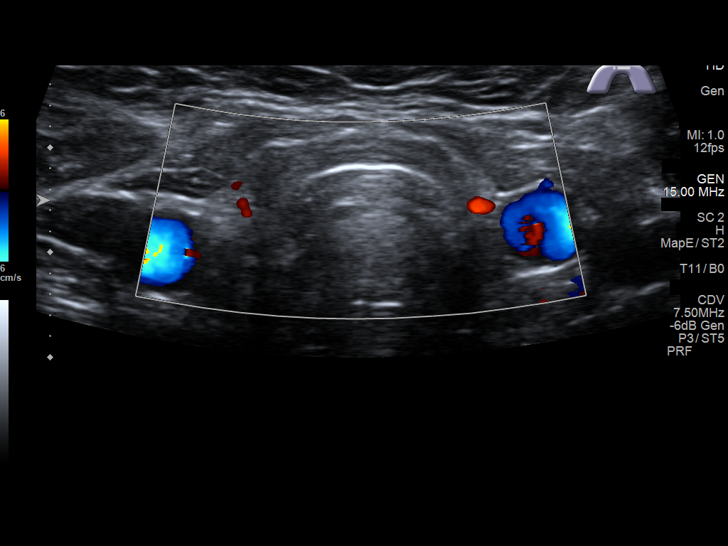
[im 5/25]
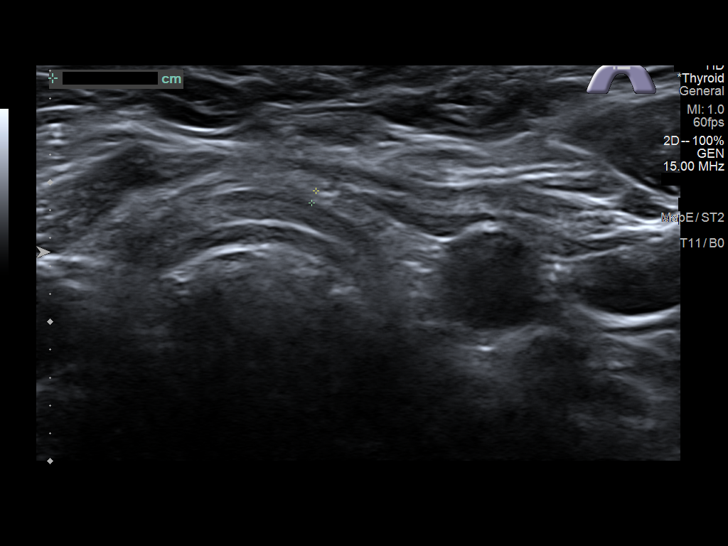
[im 7/25]
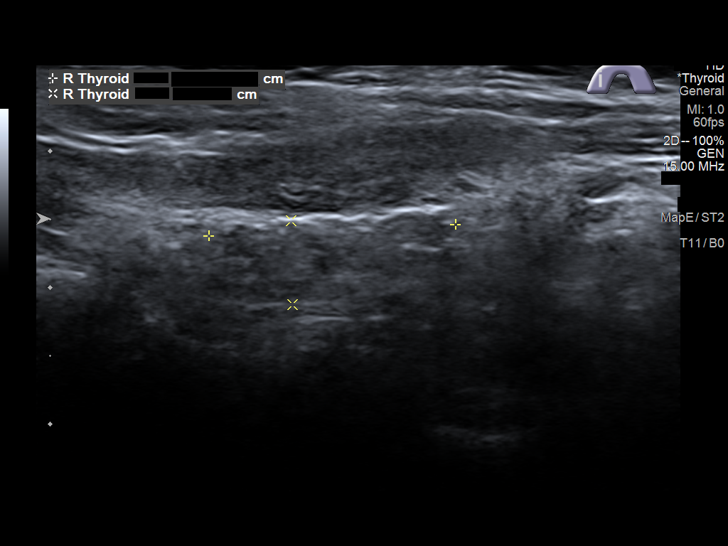
[im 9/25]
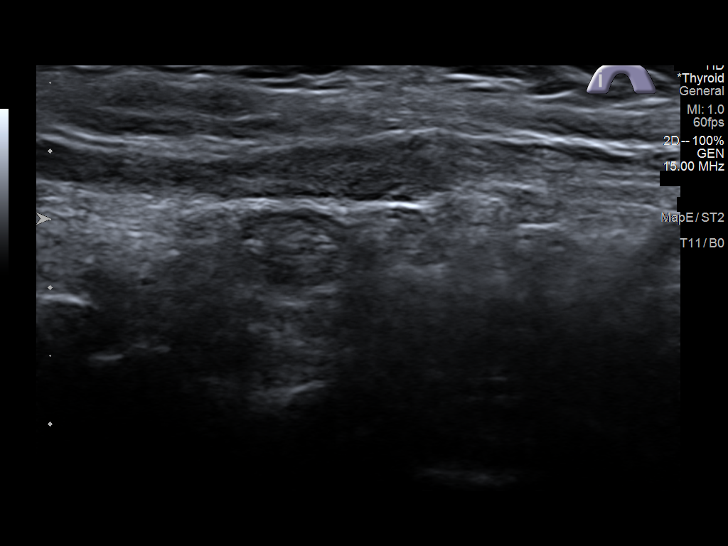
[im 10/25]
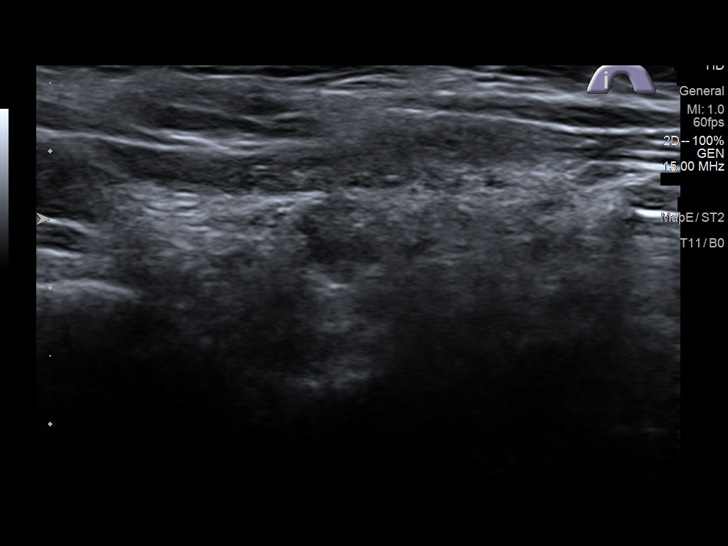
[im 12/25]
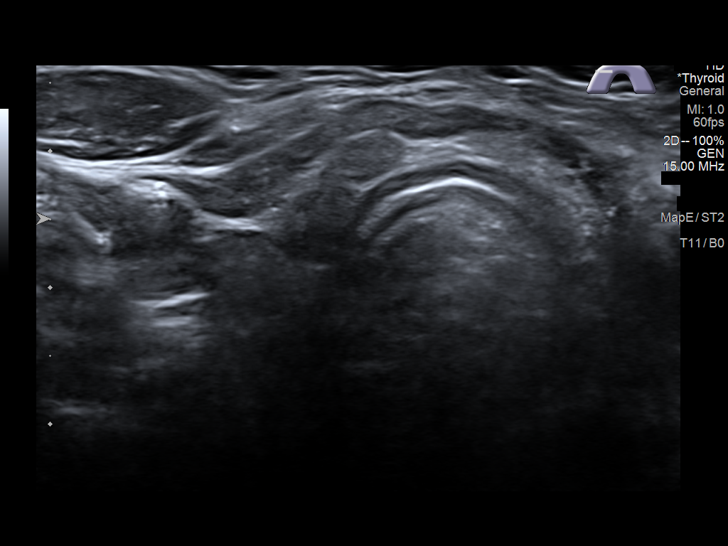
[im 14/25]
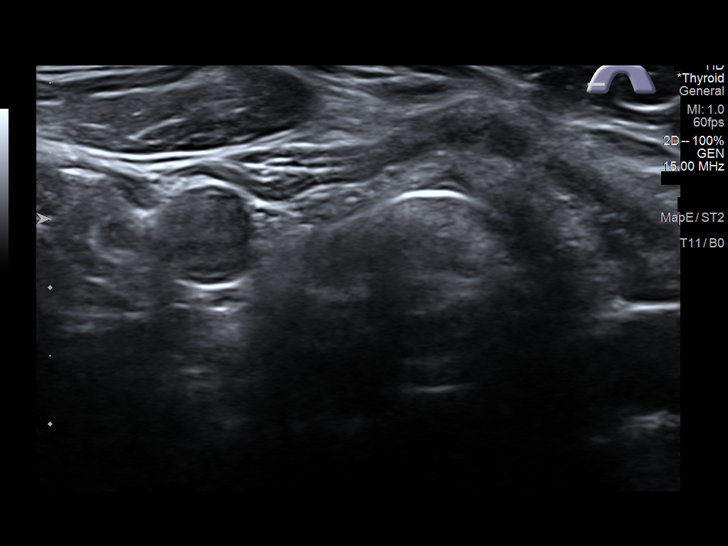
[im 16/25]
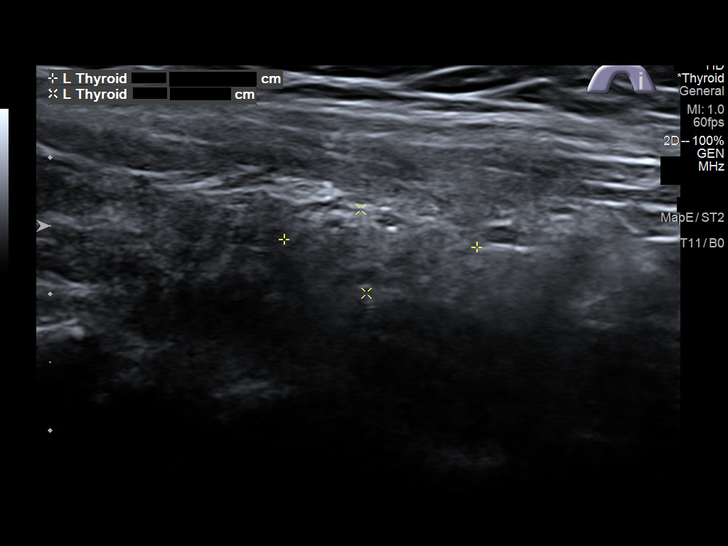
[im 17/25]
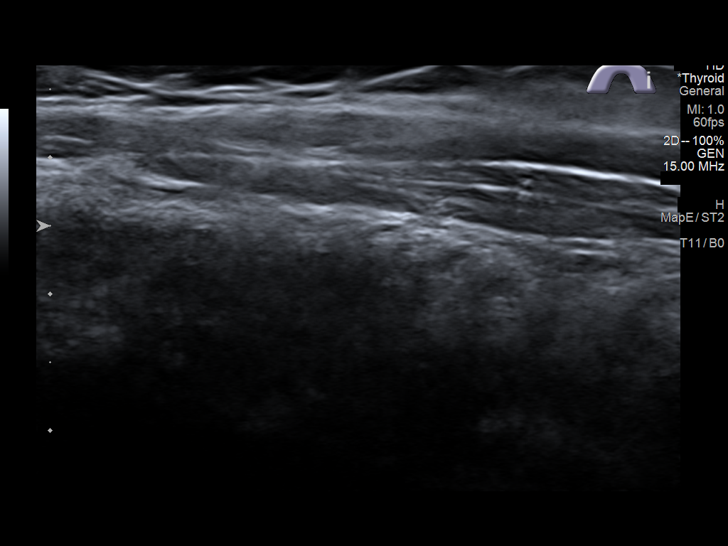
[im 19/25]
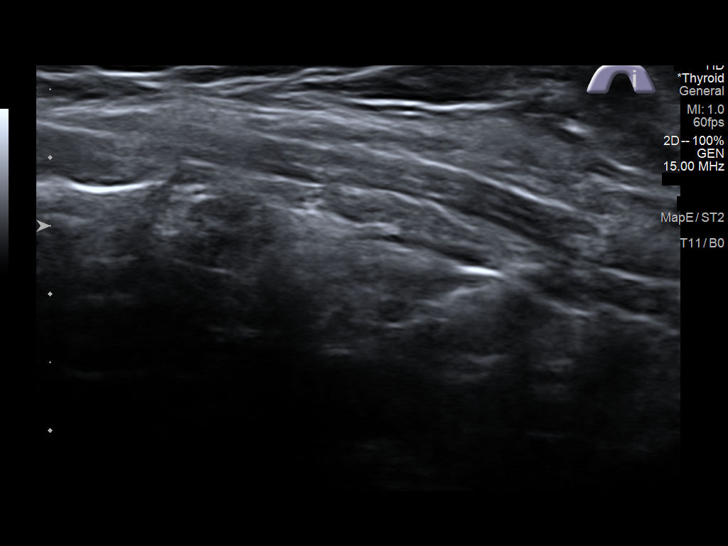
[im 21/25]
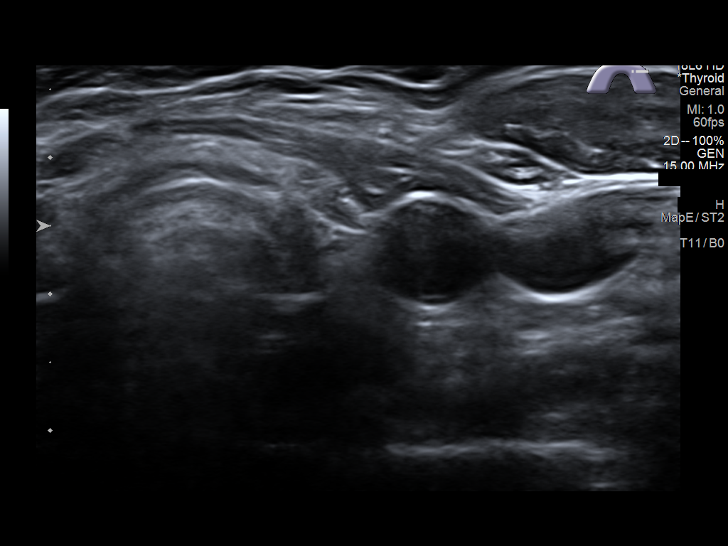
[im 23/25]
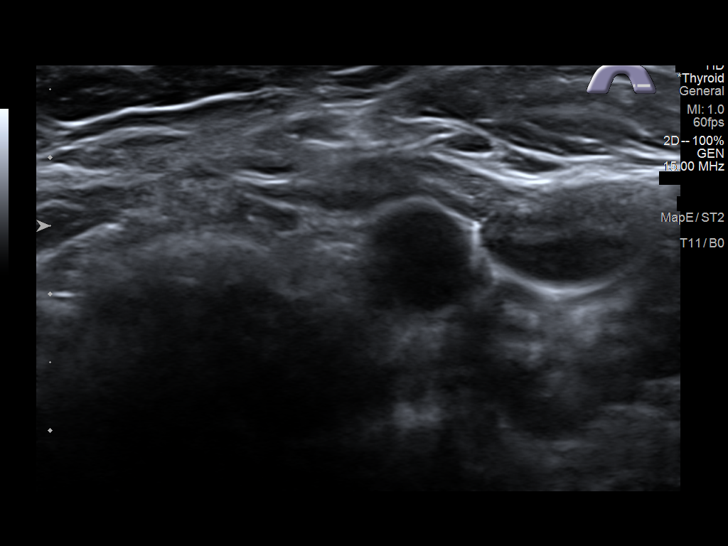
[im 25/25]
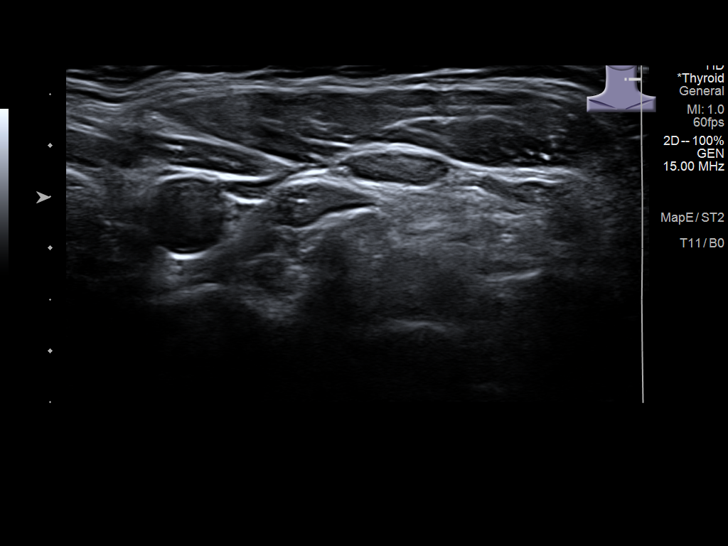

[14 of 25 positions shown; findings below may reference images not displayed]

FINDINGS: Parenchymal Echotexture: Markedly heterogenous

Isthmus: Atrophic measuring 0.1 cm in diameter

Right lobe: Atrophic measuring 1.8 x 0.6 x 0.6 cm, decreased in size
the interval, previously, 2.2 x 1 3 x 0.9 cm

Left lobe: Atrophic measuring 1.4 x 0.6 x 0.5 cm, decreased in size
in the interval, previously, 1.9 x 0.9 x 0.9 cm.

_________________________________________________________

Estimated total number of nodules >/= 1 cm: 0

Number of spongiform nodules >/=  2 cm not described below (TR1): 0

Number of mixed cystic and solid nodules >/= 1.5 cm not described
below (TR2): 0

_________________________________________________________

No discrete nodules are seen within the thyroid gland.
IMPRESSION: Continued atrophy of markedly heterogeneous thyroid gland without
discrete nodule or mass. While nonspecific, findings could be seen
in the setting of a chronic/progressive thyroiditis.

## 2021-04-25 ENCOUNTER — Other Ambulatory Visit: Payer: Self-pay | Admitting: Specialist

## 2021-04-25 DIAGNOSIS — R9401 Abnormal electroencephalogram [EEG]: Secondary | ICD-10-CM

## 2021-04-28 ENCOUNTER — Other Ambulatory Visit: Payer: Self-pay | Admitting: Physician Assistant

## 2021-05-05 ENCOUNTER — Ambulatory Visit
Admission: RE | Admit: 2021-05-05 | Discharge: 2021-05-05 | Disposition: A | Discharge status: Home / Self Care | Payer: PPO | Source: Ambulatory Visit | Attending: Specialist | Admitting: Specialist

## 2021-05-05 DIAGNOSIS — R9401 Abnormal electroencephalogram [EEG]: Secondary | ICD-10-CM

## 2021-05-08 ENCOUNTER — Ambulatory Visit (INDEPENDENT_AMBULATORY_CARE_PROVIDER_SITE_OTHER): Payer: PPO | Admitting: Physician Assistant

## 2021-05-08 ENCOUNTER — Encounter: Payer: Self-pay | Admitting: Physician Assistant

## 2021-05-08 DIAGNOSIS — F4310 Post-traumatic stress disorder, unspecified: Secondary | ICD-10-CM | POA: Diagnosis not present

## 2021-05-08 DIAGNOSIS — G4700 Insomnia, unspecified: Secondary | ICD-10-CM

## 2021-05-08 DIAGNOSIS — F900 Attention-deficit hyperactivity disorder, predominantly inattentive type: Secondary | ICD-10-CM

## 2021-05-08 DIAGNOSIS — F319 Bipolar disorder, unspecified: Secondary | ICD-10-CM | POA: Diagnosis not present

## 2021-05-08 DIAGNOSIS — G4733 Obstructive sleep apnea (adult) (pediatric): Secondary | ICD-10-CM

## 2021-05-08 MED ORDER — LITHIUM CITRATE 300 MG/5 ML PO SYRP: ORAL | 0 refills | Status: DC

## 2021-05-08 MED ORDER — LITHIUM CARBONATE 300 MG PO TABS: 800.0000 mg | ORAL_TABLET | Freq: Every evening | ORAL | 0 refills | Status: DC

## 2021-05-08 MED ORDER — AMPHETAMINE-DEXTROAMPHETAMINE 20 MG PO TABS: 10.0000 mg | ORAL_TABLET | Freq: Two times a day (BID) | ORAL | 0 refills | Status: DC

## 2021-05-08 NOTE — Progress Notes (Signed)
[facility] Check  Patient ID: Katherine James,  MRN: [id number]  PCP: Melany Guernsey  Date of Evaluation: 05/08/2021  time spent:30 minutes  Chief Complaint:  Chief Complaint   ADHD; Anxiety; Depression; Insomnia     Virtual Visit via Telehealth  I connected with patient by telephone, with their informed consent, and verified patient privacy and that I am speaking with the correct person using two identifiers.  I am private, in my office and the patient is at home.  I discussed the limitations, risks, security and privacy concerns of performing an evaluation and management service by telephone  and the availability of in person appointments. I also discussed with the patient that there may be a patient responsible charge related to this service. The patient expressed understanding and agreed to proceed.   I discussed the assessment and treatment plan with the patient. The patient was provided an opportunity to ask questions and all were answered. The patient agreed with the plan and demonstrated an understanding of the instructions.   The patient was advised to call back or seek an in-person evaluation if the symptoms worsen or if the condition fails to improve as anticipated.  I provided 30 minutes of non-face-to-face time during this encounter.   HISTORY/CURRENT STATUS: HPI For routine med check.  She is still gradually weaning off the lithium.  Wants to get off it completely to see if it will help the nystagmus.  Is now at a total of 850 mg so decreasing by 25 mg not long ago has not negatively affected her.  Feels that her mood is pretty good.  Able to enjoy things, energy and motivation depend on how the fibromyalgia is affecting her.  She sleeps pretty well.  Not isolating.  ADLs and personal hygiene are normal.  No suicidal or homicidal thoughts.  Asks for a recommendation of someone who can do a test to rule in or rule out borderline personality disorder.  States she  does not want that on her chart if she does not have it and she does not think she has it.  Patient denies increased energy with decreased need for sleep, no increased talkativeness, no racing thoughts, no impulsivity or risky behaviors, no increased spending, no increased libido, no grandiosity, no increased irritability or anger, and no hallucinations.  States that attention is good without easy distractibility.  Able to focus on things and finish tasks to completion.   Denies dizziness, syncope, seizures, numbness, tingling, tics, unsteady gait, slurred speech, confusion.  Has chronic muscle and joint pain secondary to fibromyalgia and arthritis.  Still has trouble with double vision and the nystagmus.  But it is no worse and is not better either.   Individual Medical History/ Review of Systems: Changes? :No     Past medications for mental health diagnoses include: Seroquel, Lamictal, lithium, Provigil, Nuvigil, Adderall, mirtazapine, Celexa, Depakote, Wellbutrin, Prozac, Paxil, desipramine, [place] caused rash, had 2 treatments of Spravato with Dr. Toy Care   Allergies: Renee Harder [deutetrabenazine], Alprazolam, Amoxicillin-pot clavulanate, Celecoxib, Chlorpromazine hcl, Codeine, Depakote [divalproex sodium], Desipramine, Diclofenac sodium, Doxycycline, Etodolac, Fluoxetine hcl, Fluticasone propionate, Ibuprofen, Influenza vaccines, Iodine, Ketorolac tromethamine, Levofloxacin, Meperidine hcl, Nabumetone, Naproxen, Neurontin [gabapentin], Nsaids, Olive oil, Oxaprozin, Paroxetine, Phenergan [promethazine hcl], Piroxicam, Pneumococcal vaccines, Rofecoxib, Skelaxin [metaxalone], Sulfamethoxazole-trimethoprim, Sulfasalazine, Sulindac, Symbicort [budesonide-formoterol fumarate], Terfenadine, Tetracycline, Tramadol hcl, Wellbutrin [bupropion], Zinc, and Lisinopril  Current Medications:  Current Outpatient Medications:    carisoprodol (SOMA) 350 MG tablet, Take 3 tablets daily, Disp: , Rfl:    lamoTRIgine  (  LAMICTAL) 200 MG tablet, TAKE 2 TABLETS BY MOUTH EVERY DAY, Disp: 180 tablet, Rfl: 1   levothyroxine (SYNTHROID) 50 MCG tablet, Take 50 mcg by mouth daily before breakfast., Disp: , Rfl:    ondansetron (ZOFRAN) 4 MG tablet, TAKE 1 AND 1/2 TABLETS BY MOUTH DAILY FOR NAUSEA, Disp: 45 tablet, Rfl: 0   oxyCODONE (OXY IR/ROXICODONE) 5 MG immediate release tablet, Take 10 mg daily as needed by mouth for severe pain or breakthrough pain. , Disp: , Rfl:    Pregnenolone Micronized (PREGNENOLONE PO), Take by mouth., Disp: , Rfl:    Suvorexant (BELSOMRA) 20 MG TABS, Take 1 tablet by mouth at bedtime as needed., Disp: 30 tablet, Rfl: 5   UNABLE TO FIND, DHEA SR (compound)- take 17 mg (1 tablet) daily except Saturday and Sunday, Disp: , Rfl:    UNABLE TO FIND, Hormone Topical Cream (Compound): Bi-Est (8:2) 1 mg, apply 1 gram every morning, Disp: , Rfl:    UNABLE TO FIND, Fentenyl Pain Patch, apply one 12 mcg patch every 3 days for chronic pain, Disp: , Rfl:    UNABLE TO FIND, Nature's Thryoid- take 3 g M, W and F, and 2 g every other day, Disp: , Rfl:    UNABLE TO FIND, Progesterone SR 300 mg (compound), take 1 tablet daily at bedtime, Disp: , Rfl:    UNABLE TO FIND, Seroquel (Compound)- 8.3 mg per mL, take 1 mL daily at bedtime, Disp: , Rfl:    UNABLE TO FIND, Pregnenolone 90 mg (compound), take 1 tablet daily, Disp: , Rfl:    UNABLE TO FIND, Testosterone Cream 0.5%- 1g/79m, Disp: , Rfl:    amphetamine-dextroamphetamine (ADDERALL) 20 MG tablet, Take 0.5 tablets (10 mg total) by mouth 2 (two) times daily., Disp: 30 tablet, Rfl: 0   hydrochlorothiazide (HYDRODIURIL) 25 MG tablet, TAKE 1 TABLET BY MOUTH ONCE DAILY. (Patient not taking: Reported on 05/30/2020), Disp: 30 tablet, Rfl: 1   lithium 300 MG tablet, Take 2.5 tablets (750 mg total) by mouth at bedtime., Disp: 90 tablet, Rfl: 0   lithium citrate 300 MG/5ML solution, Use as directed, in addition to lithium tablets to decrease by 25 mg every 2 to 3 weeks.,  Disp: 250 mL, Rfl: 0   LORazepam (ATIVAN) 0.5 MG tablet, TAKE 1/2 TO 1 TABLET BY MOUTH TWICE A DAY AS NEEDED FOR ANXIETY (Patient not taking: Reported on 05/08/2021), Disp: 30 tablet, Rfl: 5   pregabalin (LYRICA) 25 MG capsule, Take 25 mg by mouth 2 (two) times daily.  (Patient not taking: Reported on 02/15/2021), Disp: , Rfl:    progesterone (PROMETRIUM) 200 MG capsule, Take 200 mg by mouth daily. (Patient not taking: Reported on 03/27/2021), Disp: , Rfl:  Medication Side Effects: none  Family Medical/ Social History: Changes? No  MENTAL HEALTH EXAM:  There were no vitals taken for this visit.There is no height or weight on file to calculate BMI.  General Appearance:  Unable to assess  Eye Contact:   Unable to assess  Speech:  Clear and Coherent and Normal Rate  Volume:  Normal  Mood:  Euthymic  Affect:   Unable to assess  Thought Process:  Goal Directed and Descriptions of Associations: Intact  Orientation:  Full (Time, Place, and Person)  Thought Content: Logical   Suicidal Thoughts:  No  Homicidal Thoughts:  No  Memory:   Fair to good depending on the day.  Judgement:  Good  Insight:  Good  Psychomotor Activity:   Unable to assess  Concentration:  Concentration: Good and Attention Span: Poor  Recall:  Good  Fund of Knowledge: Good  Language: Good  Assets:  Desire for Improvement  ADL's:  Intact  Cognition: WNL  Prognosis:  Good   Gene site test results are scanned in chart under labs 02/22/2021.  Had reduced folic acid conversion.  Neuropsychological testing results in chart 02/28/2021. Assessment summary was intact neurocognitive abilities.  Premorbid intellectual functioning high average.  Intact performance of attention, processing speed, language, visual construction, memory, executive function, relative strength was observed on task of attention and speeded word generation.  Diagnosis and conclusions were multiple concussions, MDD, cluster B personality features, rule out  borderline personality disorder.  DBT was recommended.  Per Dr. Avis Epley [facility]   DIAGNOSES:    ICD-10-CM   1. Bipolar I disorder ([place])  F31.9     2. PTSD (post-traumatic stress disorder)  F43.10     3. Attention deficit hyperactivity disorder (ADHD), predominantly inattentive type  F90.0     4. Insomnia, unspecified type  G47.00     5. Obstructive sleep apnea  G47.33        Receiving Psychotherapy: Yes  Sammuel Cooper, LCSW in the past  RECOMMENDATIONS:  PDMP was reviewed.  Last Adderall filled 04/28/2021.  Ativan filled 04/25/2021 Belsomra filled 04/14/2021.  Oxycodone, fentanyl, and Soma known to me. I provided 30 minutes of non-face-to-face time during this encounter, including time spent before and after the visit in records review, medical decision making, counseling pertinent to today's visit, and charting.  She is tolerating the extremely slow wean of lithium very well.  She will decrease the dose again by 25 mg when she feels like she can tolerate it.  I think she could tolerate going quicker but she does not think so. As far as a recommendation for someone to test for borderline personality, I am unable to help there but I have recommended she talk with Dr. Avis Epley, who administered the neuro psychological testing at [facility] 02/28/2021.  Continue lithium 850 mg daily.  She uses the 300 mg tablets plus syrup to decrease daily dose by 25 mg every few weeks. Continue Adderall 20 mg, one half p.o. twice a day as needed. Continue Ativan 0.5 mg, 1/2-1 po bid prn.  Use sparingly. Continue Lamictal 200 mg, 1 po bid. Continue Belsomra 20 mg, 1 p.o. nightly as needed. Continue Seroquel 8.3 mg daily, she has compounded. Return in 3 months.  Donnal Moat, PA-C

## 2021-05-18 ENCOUNTER — Ambulatory Visit: Payer: PPO | Attending: Anesthesiology

## 2021-05-18 ENCOUNTER — Other Ambulatory Visit: Payer: Self-pay

## 2021-05-18 DIAGNOSIS — R2681 Unsteadiness on feet: Secondary | ICD-10-CM | POA: Insufficient documentation

## 2021-05-18 DIAGNOSIS — R42 Dizziness and giddiness: Secondary | ICD-10-CM | POA: Insufficient documentation

## 2021-05-19 NOTE — Therapy (Signed)
[place] ?[facility] ?[address] [place], [place], [address] ?Phone: [phone]   Fax:  [phone] ? ?Physical Therapy Evaluation ? ?Patient Details  ?Name: Katherine James ?MRN: [id number] ?Date of Birth: Sep 23, 1960 ?Referring Provider (PT): Dr. Joice Lofts ? ? ?Encounter Date: 05/18/2021 ? ? PT End of Session - 05/19/21 1157   ? ? Visit Number 1   ? Number of Visits 1   ? PT Start Time 1530   ? PT Stop Time 0488   ? PT Time Calculation (min) 15 min   ? ?  ?  ? ?  ? ? ?Past Medical History:  ?Diagnosis Date  ? Adrenal failure ([place])   ? now classified as adrenal fatigue  ? Allergy   ? Basal cell carcinoma   ? Bipolar 1 disorder ([place])   ? Chondromalacia   ? 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 left lower pretibia, marked atypia. Excised 04/25/2007  ? Dysplastic nevus 01/02/2008  ? right anterior lateral thigh, excised 03/09/2008  ? Dysplastic nevus 12/03/2008  ? left upper arm, moderate atypia  ? Dysplastic nevus 12/03/2008  ? left forearm, mild atypia  ? Dysplastic nevus 05/25/2014  ? left upper calf, mild atypia  ? Dysplastic nevus 03/01/2020  ? R mid back, mod to severe atypia, exc 04/25/20   ? Fatty liver   ? Fibromyalgia   ? Hypothyroidism   ? OSA (obstructive sleep apnea)   ? cpap  ? TMJ syndrome   ? ? ?Past Surgical History:  ?Procedure Laterality Date  ? ABDOMINAL HYSTERECTOMY  1998  ? bilateral breast reduction  1999  ? bilateral salpingoopherectomy  2003  ? COLONOSCOPY WITH PROPOFOL N/A 07/10/2017  ? Procedure: COLONOSCOPY WITH PROPOFOL;  Surgeon: Manya Silvas, MD;  Location: [facility] ENDOSCOPY;  Service: Endoscopy;  Laterality: N/A;  ? [place]  ? LIGAMENT REPAIR  1982  ? left ankle  ? LIPOSUCTION  1999  ? abdomen  ? [place]  ? bilateral  ? REDUCTION MAMMAPLASTY Bilateral 1999  ? TONSILLECTOMY  1978  ? TUBAL LIGATION  1996  ? ? ?There were no vitals filed for this visit. ? ? ? ? ? ?Pt arrived for initial evaluation  and on intake pt reports that she specifically wants her physical therapist to perform myofascial release for her widespread fibromyalgia pain. She has started to work with a Art gallery manager and would like therapy to perform passive modalities to help with her pain. This [facility] does not specialize in myofascial release and the evidence on chronic pain indicates that the best strategies for treating chronic pain are active interventions involving limited passive modalities only as necessary. Pt would like to reschedule with a therapist that focuses on myofascial release. Therapist offered pt an appointment with another [facility] therapist, Jerl Mina PT DPT [facility] and pt rescheduled for a separate evaluation. This therapist offered to perform the initial evaluation and treat her until her first appointment with [place] arrives however she declines at this time. Prior to leaving the clinic patient changed her mind and canceled all of her appointments within [facility] stating that she would like to look for a different provider/system for physical therapy. No charges entered for this visit.   ? ? ? ? ? ? ? ? ? ? ? ? ? ? ? ? ? ? ? ? ? ? ? ? ? ? ? ? ?Patient will benefit from skilled therapeutic intervention in order to  improve the following deficits and impairments:    ? ?Visit Diagnosis: ?Dizziness and giddiness ? ?Unsteadiness on feet ? ? ? ? ?Problem List ?Patient Active Problem List  ? Diagnosis Date Noted  ? Bipolar affective disorder ([place]) 07/03/2016  ? Nasal lesion 07/03/2016  ? Osteoarthritis 07/03/2016  ? Fibromyalgia 07/03/2016  ? Chronic fatigue 07/03/2016  ? Neuropathy 07/03/2016  ? Hypothyroid 07/03/2016  ? Obstructive sleep apnea 03/25/2008  ? ?Lyndel Safe Othon Guardia PT, DPT, GCS  ?Elgar Scoggins, PT ?05/19/2021, 12:07 PM ? ?[place] ?[facility] ?[address] [place], [place], [address] ?Phone: [phone]   Fax:  [phone] ? ?Name: Katherine James ?MRN:  [id number] ?Date of Birth: 19-Apr-1960 ? ? ?

## 2021-06-05 ENCOUNTER — Ambulatory Visit: Payer: PPO | Admitting: Physical Therapy

## 2021-06-08 ENCOUNTER — Other Ambulatory Visit: Payer: Self-pay | Admitting: Physician Assistant

## 2021-06-09 ENCOUNTER — Other Ambulatory Visit: Payer: Self-pay | Admitting: Physician Assistant

## 2021-06-09 MED ORDER — LITHIUM CARBONATE 300 MG PO CAPS: 600.0000 mg | ORAL_CAPSULE | Freq: Every day | ORAL | 1 refills | Status: DC

## 2021-06-09 NOTE — Telephone Encounter (Signed)
Received refill request for lithium. The refill did not mesh with your last note. I know patient is doing a slow wean and called her to ask what her current dose is. She said she is taking two 300-mg capsules and one 150-mg capsule. She is still on the syrup, but I wasn't concerned about that since that refill was not in question. There is a no print order in Epic but it is for tablets. I called pharmacy to see if she gets tablets or capsules and she said she usually gets capsules.  [facility] is the requested pharmacy. I can send a RF, just wanted to run by you first.  ?

## 2021-06-20 ENCOUNTER — Other Ambulatory Visit: Payer: Self-pay | Admitting: Physician Assistant

## 2021-06-20 NOTE — Telephone Encounter (Signed)
Last filled 3/31, due 4/29 ?

## 2021-06-22 NOTE — Telephone Encounter (Signed)
Patient is not due for RF until 4/29, but pharmacy keeps sending RF requests. Called pharmacy and patient wants delivered on 4/28 with her other meds and they don't deliver on Saturday and are not open on Sunday.  ?

## 2021-07-17 ENCOUNTER — Other Ambulatory Visit: Payer: Self-pay | Admitting: Obstetrics and Gynecology

## 2021-07-17 DIAGNOSIS — E2839 Other primary ovarian failure: Secondary | ICD-10-CM

## 2021-07-20 ENCOUNTER — Other Ambulatory Visit: Payer: Self-pay | Admitting: Physician Assistant

## 2021-07-21 NOTE — Telephone Encounter (Signed)
Pt called to check on the status of the refill

## 2021-07-21 NOTE — Telephone Encounter (Signed)
Called patient to let her know Rx had been sent to provider for approval.

## 2021-08-07 ENCOUNTER — Ambulatory Visit: Payer: PPO | Admitting: Physician Assistant

## 2021-08-08 ENCOUNTER — Other Ambulatory Visit: Payer: Self-pay | Admitting: Physician Assistant

## 2021-08-10 ENCOUNTER — Other Ambulatory Visit: Payer: Self-pay | Admitting: Physician Assistant

## 2021-08-11 ENCOUNTER — Encounter: Payer: Self-pay | Admitting: Physician Assistant

## 2021-08-11 ENCOUNTER — Telehealth: Payer: Self-pay | Admitting: Physician Assistant

## 2021-08-11 ENCOUNTER — Ambulatory Visit (INDEPENDENT_AMBULATORY_CARE_PROVIDER_SITE_OTHER): Payer: PPO | Admitting: Physician Assistant

## 2021-08-11 DIAGNOSIS — F319 Bipolar disorder, unspecified: Secondary | ICD-10-CM

## 2021-08-11 DIAGNOSIS — F4310 Post-traumatic stress disorder, unspecified: Secondary | ICD-10-CM

## 2021-08-11 DIAGNOSIS — G4733 Obstructive sleep apnea (adult) (pediatric): Secondary | ICD-10-CM

## 2021-08-11 DIAGNOSIS — G4700 Insomnia, unspecified: Secondary | ICD-10-CM

## 2021-08-11 DIAGNOSIS — F900 Attention-deficit hyperactivity disorder, predominantly inattentive type: Secondary | ICD-10-CM

## 2021-08-11 MED ORDER — LITHIUM CITRATE 300 MG/5 ML PO SYRP
ORAL | 0 refills | Status: DC
Start: 2021-08-11 — End: 2021-11-14

## 2021-08-11 NOTE — Progress Notes (Signed)
[facility]  Patient ID: Katherine James,  MRN: [id number]  PCP: Melany Guernsey  Date of Evaluation: 08/11/2021  time spent:30 minutes  Chief Complaint:  Chief Complaint   Depression    Virtual Visit via Telehealth  I connected with patient by telephone, with their informed consent, and verified patient privacy and that I am speaking with the correct person using two identifiers.  I am private, in my office and the patient is at home.  I discussed the limitations, risks, security and privacy concerns of performing an evaluation and management service by telephone  and the availability of in person appointments. I also discussed with the patient that there may be a patient responsible charge related to this service. The patient expressed understanding and agreed to proceed.   I discussed the assessment and treatment plan with the patient. The patient was provided an opportunity to ask questions and all were answered. The patient agreed with the plan and demonstrated an understanding of the instructions.   The patient was advised to call back or seek an in-person evaluation if the symptoms worsen or if the condition fails to improve as anticipated.  I provided 30 minutes of non-face-to-face time during this encounter.   HISTORY/CURRENT STATUS: HPI For routine med check.  Has had to take Ativan a few times in the past couple of weeks. Her Mom's birthday was this past week, so that made her more anxious. But usually not having to take it.   Continues to wean off lithium.  Has been at this for months.  When she tried to decrease by 25 mg since our last visit she had cognitive problems so has stayed at 750 mg daily for a full 2 months.  She will be going to 725 mg daily starting tomorrow.  Patient denies loss of interest in usual activities and is able to enjoy things when her physical health cooperates.  She has chronic pain and is on opiates.  Energy and motivation depend on  how well she feels physically.  ADLs and personal hygiene are normal.  Appetite has not changed.  Weight is stable.   No extreme sadness, tearfulness, or feelings of hopelessness.  Sleeps well most of the time with the Belsomra and CPAP.  Denies suicidal or homicidal thoughts.  Patient denies increased energy with decreased need for sleep, no increased talkativeness, no racing thoughts, no impulsivity or risky behaviors, no increased spending, no increased libido, no grandiosity, no increased irritability or anger, no paranoia, and no hallucinations.  States that attention is good without easy distractibility.  Able to focus on things and finish tasks to completion.   Denies dizziness, syncope, seizures, numbness, tingling, tics, unsteady gait, slurred speech, confusion.  Has chronic muscle and joint pain secondary to fibromyalgia and arthritis.  Still has trouble with double vision and the nystagmus.  But it is no worse and is not better either.   Individual Medical History/ Review of Systems: Changes? :Yes    Is currently on Ivermectin and Zpak for covid.  Per her PCP.  Past medications for mental health diagnoses include: Seroquel, Lamictal, lithium, Provigil, Nuvigil, Adderall, mirtazapine, Celexa, Depakote, Wellbutrin, Prozac, Paxil, desipramine, [place]edo caused rash, had 2 treatments of Spravato with Dr. Toy Care   Allergies: Renee Harder [deutetrabenazine], Alprazolam, Amoxicillin-pot clavulanate, Celecoxib, Chlorpromazine hcl, Codeine, Depakote [divalproex sodium], Desipramine, Diclofenac sodium, Doxycycline, Etodolac, Fluoxetine hcl, Fluticasone propionate, Ibuprofen, Influenza vaccines, Iodine, Ketorolac tromethamine, Levofloxacin, Meperidine hcl, Nabumetone, Naproxen, Neurontin [gabapentin], Nsaids, Olive oil, Oxaprozin, Paroxetine, Phenergan [promethazine hcl],  Piroxicam, Pneumococcal vaccines, Rofecoxib, Skelaxin [metaxalone], Sulfamethoxazole-trimethoprim, Sulfasalazine, Sulindac, Symbicort  [budesonide-formoterol fumarate], Terfenadine, Tetracycline, Tramadol hcl, Wellbutrin [bupropion], Zinc, and Lisinopril  Current Medications:  Current Outpatient Medications:    amphetamine-dextroamphetamine (ADDERALL) 20 MG tablet, TAKE 1/2 TABLET BY MOUTH 2 TIMES DAILY, Disp: 30 tablet, Rfl: 0   azithromycin (ZITHROMAX) 250 MG tablet, azithromycin 250 mg tablet  TAKE 2 TABLETS BY MOUTH ON THE FIRST DAY THEN 1 TABLET DAILY FOR REMAINDER, Disp: , Rfl:    BELSOMRA 20 MG TABS, TAKE 1 TABLET BY MOUTH AT BEDTIME AS NEEDED, Disp: 30 tablet, Rfl: 0   carisoprodol (SOMA) 350 MG tablet, Take 3 tablets daily, Disp: , Rfl:    lamoTRIgine (LAMICTAL) 200 MG tablet, TAKE 2 TABLETS BY MOUTH EVERY DAY, Disp: 180 tablet, Rfl: 1   levothyroxine (SYNTHROID) 50 MCG tablet, Take 50 mcg by mouth daily before breakfast., Disp: , Rfl:    lithium 300 MG tablet, Take 2.5 tablets (750 mg total) by mouth at bedtime., Disp: 90 tablet, Rfl: 0   LORazepam (ATIVAN) 0.5 MG tablet, TAKE 1/2 TO 1 TABLET BY MOUTH TWICE A DAY AS NEEDED FOR ANXIETY, Disp: 30 tablet, Rfl: 5   ondansetron (ZOFRAN) 4 MG tablet, TAKE 1 AND 1/2 TABLETS BY MOUTH DAILY FOR NAUSEA, Disp: 45 tablet, Rfl: 0   oxyCODONE (OXY IR/ROXICODONE) 5 MG immediate release tablet, Take 10 mg daily as needed by mouth for severe pain or breakthrough pain. , Disp: , Rfl:    Pregnenolone Micronized (PREGNENOLONE PO), Take by mouth., Disp: , Rfl:    UNABLE TO FIND, DHEA SR (compound)- take 17 mg (1 tablet) daily except Saturday and Sunday, Disp: , Rfl:    UNABLE TO FIND, Hormone Topical Cream (Compound): Bi-Est (8:2) 1 mg, apply 1 gram every morning, Disp: , Rfl:    UNABLE TO FIND, Fentenyl Pain Patch, apply one 12 mcg patch every 3 days for chronic pain, Disp: , Rfl:    UNABLE TO FIND, Nature's Thryoid- take 3 g M, W and F, and 2 g every other day, Disp: , Rfl:    UNABLE TO FIND, Progesterone SR 300 mg (compound), take 1 tablet daily at bedtime, Disp: , Rfl:    UNABLE TO  FIND, Seroquel (Compound)- 8.3 mg per mL, take 1 mL daily at bedtime, Disp: , Rfl:    UNABLE TO FIND, Pregnenolone 90 mg (compound), take 1 tablet daily, Disp: , Rfl:    UNABLE TO FIND, Testosterone Cream 0.5%- 1g/67m, Disp: , Rfl:    hydrochlorothiazide (HYDRODIURIL) 25 MG tablet, TAKE 1 TABLET BY MOUTH ONCE DAILY. (Patient not taking: Reported on 05/30/2020), Disp: 30 tablet, Rfl: 1   lithium carbonate 300 MG capsule, Take 2 capsules (600 mg total) by mouth at bedtime. (Patient not taking: Reported on 08/11/2021), Disp: 60 capsule, Rfl: 1   lithium citrate 300 MG/5ML solution, Add to other Lithium '600mg'$ =725 mg., Disp: 250 mL, Rfl: 0   pregabalin (LYRICA) 25 MG capsule, Take 25 mg by mouth 2 (two) times daily.  (Patient not taking: Reported on 02/15/2021), Disp: , Rfl:    progesterone (PROMETRIUM) 200 MG capsule, Take 200 mg by mouth daily. (Patient not taking: Reported on 03/27/2021), Disp: , Rfl:  Medication Side Effects: none  Family Medical/ Social History: Changes? No  MENTAL HEALTH EXAM:  There were no vitals taken for this visit.There is no height or weight on file to calculate BMI.  General Appearance:  Unable to assess  Eye Contact:   Unable to assess  Speech:  Clear and Coherent and Normal Rate  Volume:  Normal  Mood:  Euthymic  Affect:   Unable to assess  Thought Process:  Goal Directed and Descriptions of Associations: Circumstantial  Orientation:  Full (Time, Place, and Person)  Thought Content: Logical   Suicidal Thoughts:  No  Homicidal Thoughts:  No  Memory:   Fair to good depending on the day.  Judgement:  Good  Insight:  Good  Psychomotor Activity:   Unable to assess    Concentration:  Concentration: Good and Attention Span: Fair  Recall:  Good  Fund of Knowledge: Good  Language: Good  Assets:  Desire for Improvement  ADL's:  Intact  Cognition: WNL  Prognosis:  Good   Gene site test results are scanned in chart under labs 02/22/2021.  Had reduced folic acid  conversion.  Neuropsychological testing results in chart 02/28/2021. Assessment summary was intact neurocognitive abilities.  Premorbid intellectual functioning high average.  Intact performance of attention, processing speed, language, visual construction, memory, executive function, relative strength was observed on task of attention and speeded word generation.  Diagnosis and conclusions were multiple concussions, MDD, cluster B personality features, rule out borderline personality disorder.  DBT was recommended.  Per Dr. Avis Epley [facility] health care   DIAGNOSES:    ICD-10-CM   1. Bipolar I disorder ([place])  F31.9     2. PTSD (post-traumatic stress disorder)  F43.10     3. Insomnia, unspecified type  G47.00     4. Attention deficit hyperactivity disorder (ADHD), predominantly inattentive type  F90.0     5. Obstructive sleep apnea  G47.33       Receiving Psychotherapy: No    RECOMMENDATIONS:  PDMP was reviewed.  Last Adderall filled 07/22/2021.  Ativan filled 07/20/2021.  Belsomra filled 07/10/2021.  Oxycodone, fentanyl, and Soma known to me. I provided 30 minutes of non-face-to-face time during this encounter, including time spent before and after the visit in records review, medical decision making, counseling pertinent to today's visit, and charting.  Recommend she continue to wean off lithium.  Patient states she cannot tolerate a faster wean than 25 mg every few weeks to few months so we will go at her pace.  She still questions whether she has bipolar disorder or not and would like to get off all of her medication.  Continue Adderall 20 mg, one half p.o. twice a day as needed. Continue Ativan 0.5 mg, 1/2-1 po bid prn.  Use sparingly. Continue Lamictal 200 mg, 1 po bid. Continue Belsomra 20 mg, 1 p.o. nightly as needed. Decrease lithium to a total of 725 mg daily for at least 2 weeks then decrease by 25 mg daily every 2 weeks until the next visit. Continue Seroquel 8.3 mg daily, she  has compounded. Return in 3 months.  Donnal Moat, PA-C

## 2021-08-11 NOTE — Telephone Encounter (Signed)
Please see message from pharmacy. In the past we sent lithium carbonate

## 2021-08-11 NOTE — Telephone Encounter (Signed)
Pharmacy called and asked for clarification on the script of lithium citrate that was sent in. They don't have that and they need clarification on the dosage. Please call the pharmacy at [phone]

## 2021-08-14 ENCOUNTER — Ambulatory Visit: Payer: PPO | Admitting: Physician Assistant

## 2021-08-14 NOTE — Telephone Encounter (Signed)
Called patient. She is taking 0.8 mL QPM currently. Called pharmacy and let them know that it should be lithium carbonate and not lithium citrate. They will correct and put on file.

## 2021-08-14 NOTE — Telephone Encounter (Signed)
Please call the pharmacy and ok whatever they need to do the compounding. Same qty with a few Rfs. Thanks.

## 2021-08-21 ENCOUNTER — Other Ambulatory Visit: Payer: Self-pay | Admitting: Physician Assistant

## 2021-08-23 ENCOUNTER — Other Ambulatory Visit: Payer: Self-pay | Admitting: Physician Assistant

## 2021-09-05 ENCOUNTER — Other Ambulatory Visit: Payer: Self-pay | Admitting: Physician Assistant

## 2021-09-05 NOTE — Telephone Encounter (Signed)
Filled 6/14

## 2021-09-08 ENCOUNTER — Other Ambulatory Visit: Payer: Self-pay

## 2021-09-08 ENCOUNTER — Telehealth: Payer: Self-pay | Admitting: Physician Assistant

## 2021-09-08 NOTE — Telephone Encounter (Signed)
Spoke to pharmacy and called it in

## 2021-09-08 NOTE — Telephone Encounter (Signed)
It's under 'unable to find' at the bottom. Please call in 90 ml, 1 ml qhs. RF3. Thanks.

## 2021-09-08 NOTE — Telephone Encounter (Signed)
Pharmacy called at 11:29 am asking for a refill on Katherine James's seroquel. They said that they had sent [place] a fax on the 12th and was checking the status.

## 2021-09-08 NOTE — Telephone Encounter (Signed)
Note says "Continue Seroquel 8.3 mg daily, she has compounded." But it's not on med list and I don't see a lower dose then 25 mg to send

## 2021-09-23 ENCOUNTER — Other Ambulatory Visit: Payer: Self-pay | Admitting: Physician Assistant

## 2021-09-25 ENCOUNTER — Ambulatory Visit
Admission: RE | Admit: 2021-09-25 | Discharge: 2021-09-25 | Disposition: A | Discharge status: Home / Self Care | Payer: PPO | Source: Ambulatory Visit | Attending: Obstetrics and Gynecology | Admitting: Obstetrics and Gynecology

## 2021-09-25 DIAGNOSIS — E2839 Other primary ovarian failure: Secondary | ICD-10-CM | POA: Diagnosis not present

## 2021-09-25 NOTE — Telephone Encounter (Signed)
Filled 6/27

## 2021-10-20 ENCOUNTER — Other Ambulatory Visit: Payer: Self-pay | Admitting: Physician Assistant

## 2021-10-20 NOTE — Telephone Encounter (Signed)
Filled 7/31

## 2021-11-14 ENCOUNTER — Encounter: Payer: Self-pay | Admitting: Physician Assistant

## 2021-11-14 ENCOUNTER — Ambulatory Visit (INDEPENDENT_AMBULATORY_CARE_PROVIDER_SITE_OTHER): Payer: PPO | Admitting: Physician Assistant

## 2021-11-14 DIAGNOSIS — F319 Bipolar disorder, unspecified: Secondary | ICD-10-CM | POA: Diagnosis not present

## 2021-11-14 DIAGNOSIS — F900 Attention-deficit hyperactivity disorder, predominantly inattentive type: Secondary | ICD-10-CM

## 2021-11-14 DIAGNOSIS — H5500 Unspecified nystagmus: Secondary | ICD-10-CM | POA: Diagnosis not present

## 2021-11-14 DIAGNOSIS — F4310 Post-traumatic stress disorder, unspecified: Secondary | ICD-10-CM | POA: Diagnosis not present

## 2021-11-14 DIAGNOSIS — G4700 Insomnia, unspecified: Secondary | ICD-10-CM

## 2021-11-14 MED ORDER — LITHIUM CITRATE 300 MG/5 ML PO SYRP: ORAL | 1 refills | Status: DC

## 2021-11-14 NOTE — Progress Notes (Signed)
[facility]  Patient ID: Katherine James,  MRN: [id number]  PCP: Melany Guernsey  Date of Evaluation: 11/14/2021  time spent:30 minutes  Chief Complaint:  Chief Complaint   Depression; Insomnia; Follow-up    Virtual Visit via Telehealth  I connected with patient by telephone, with their informed consent, and verified patient privacy and that I am speaking with the correct person using two identifiers.  I am private, in my office and the patient is at home.  I discussed the limitations, risks, security and privacy concerns of performing an evaluation and management service by telephone  and the availability of in person appointments. I also discussed with the patient that there may be a patient responsible charge related to this service. The patient expressed understanding and agreed to proceed.   I discussed the assessment and treatment plan with the patient. The patient was provided an opportunity to ask questions and all were answered. The patient agreed with the plan and demonstrated an understanding of the instructions.   The patient was advised to call back or seek an in-person evaluation if the symptoms worsen or if the condition fails to improve as anticipated.  I provided 30 minutes of non-face-to-face time during this encounter.  HISTORY/CURRENT STATUS: HPI For routine med check.  Still very gradually decreasing the Lithium b/c eye problems. Vision might be getting a little better.  Still weaning extremely slowly, states that she "feels awful" when she decreases the dose.  She goes down 25 mg every few months.  Patient is able to enjoy things.  Energy and motivation are fair to good depending on the day.  No extreme sadness, tearfulness, or feelings of hopelessness.  Sleeps well but has to take the [place].  ADLs and personal hygiene are normal.   Denies any changes in concentration, making decisions, or remembering things.  Adderall is still helpful.  Appetite has  not changed.  Weight is stable.  Denies laxative use, calorie restricting, or binging and purging.   Denies cutting or any form of self-harm.  Not isolating.  Not crying easily.  Denies suicidal or homicidal thoughts.  Still has anxiety at times but not too often.  Does have Ativan that she takes on occasion.  She had a few weeks this past summer that were difficult, it was the second anniversary of her mother's death so that was hard.   Patient denies increased energy with decreased need for sleep, increased talkativeness, racing thoughts, impulsivity or risky behaviors, increased spending, increased libido, grandiosity, increased irritability or anger, paranoia, or hallucinations.  Denies dizziness, syncope, seizures, numbness, tingling, tics, unsteady gait, slurred speech, confusion.  Has chronic muscle and joint pain secondary to fibromyalgia and arthritis.    Individual Medical History/ Review of Systems: Changes? :No      Past medications for mental health diagnoses include: Seroquel, Lamictal, lithium, Provigil, Nuvigil, Adderall, mirtazapine, Celexa, Depakote, Wellbutrin, Prozac, Paxil, desipramine, Austedo caused rash, had 2 treatments of Spravato with Dr. Toy Care   Allergies: Renee Harder [deutetrabenazine], Alprazolam, Amoxicillin-pot clavulanate, Celecoxib, Chlorpromazine hcl, Codeine, Depakote [divalproex sodium], Desipramine, Diclofenac sodium, Doxycycline, Etodolac, Fluoxetine hcl, Fluticasone propionate, Ibuprofen, Influenza vaccines, Iodine, Ketorolac tromethamine, Levofloxacin, Meperidine hcl, Nabumetone, Naproxen, Neurontin [gabapentin], Nsaids, Olive oil, Oxaprozin, Paroxetine, Phenergan [promethazine hcl], Piroxicam, Pneumococcal vaccines, Rofecoxib, Skelaxin [metaxalone], Sulfamethoxazole-trimethoprim, Sulfasalazine, Sulindac, Symbicort [budesonide-formoterol fumarate], Terfenadine, Tetracycline, Tramadol hcl, Wellbutrin [bupropion], Zinc, and Lisinopril  Current Medications:  Current  Outpatient Medications:    amphetamine-dextroamphetamine (ADDERALL) 20 MG tablet, TAKE 1/2 TABLET BY MOUTH 2 TIMES DAILY,  Disp: 30 tablet, Rfl: 0   BELSOMRA 20 MG TABS, TAKE 1 TABLET BY MOUTH AT BEDTIME AS NEEDED, Disp: 30 tablet, Rfl: 5   carisoprodol (SOMA) 350 MG tablet, Take 3 tablets daily, Disp: , Rfl:    lamoTRIgine (LAMICTAL) 200 MG tablet, TAKE 2 TABLETS BY MOUTH EVERY DAY, Disp: 180 tablet, Rfl: 1   levothyroxine (SYNTHROID) 50 MCG tablet, Take 50 mcg by mouth daily before breakfast., Disp: , Rfl:    lithium 300 MG tablet, Take 2.5 tablets (750 mg total) by mouth at bedtime. (Patient taking differently: Take 700 mg by mouth at bedtime.), Disp: 90 tablet, Rfl: 0   ondansetron (ZOFRAN) 4 MG tablet, TAKE 1 AND 1/2 TABLETS BY MOUTH DAILY FOR NAUSEA, Disp: 45 tablet, Rfl: 0   oxyCODONE (OXY IR/ROXICODONE) 5 MG immediate release tablet, Take 10 mg daily as needed by mouth for severe pain or breakthrough pain. , Disp: , Rfl:    Pregnenolone Micronized (PREGNENOLONE PO), Take by mouth., Disp: , Rfl:    UNABLE TO FIND, DHEA SR (compound)- take 17 mg (1 tablet) daily except Saturday and Sunday, Disp: , Rfl:    UNABLE TO FIND, Hormone Topical Cream (Compound): Bi-Est (8:2) 1 mg, apply 1 gram every morning, Disp: , Rfl:    UNABLE TO FIND, Fentenyl Pain Patch, apply one 12 mcg patch every 3 days for chronic pain, Disp: , Rfl:    UNABLE TO FIND, Nature's Thryoid- take 3 g M, W and F, and 2 g every other day, Disp: , Rfl:    UNABLE TO FIND, Progesterone SR 300 mg (compound), take 1 tablet daily at bedtime, Disp: , Rfl:    UNABLE TO FIND, Seroquel (Compound)- 8.3 mg per mL, take 1 mL daily at bedtime, Disp: , Rfl:    UNABLE TO FIND, Pregnenolone 90 mg (compound), take 1 tablet daily, Disp: , Rfl:    UNABLE TO FIND, Testosterone Cream 0.5%- 1g/7m, Disp: , Rfl:    hydrochlorothiazide (HYDRODIURIL) 25 MG tablet, TAKE 1 TABLET BY MOUTH ONCE DAILY. (Patient not taking: Reported on 05/30/2020), Disp: 30 tablet,  Rfl: 1   lithium carbonate 300 MG capsule, Take 2 capsules (600 mg total) by mouth at bedtime. (Patient not taking: Reported on 08/11/2021), Disp: 60 capsule, Rfl: 1   lithium citrate 300 MG/5ML solution, Use as directed (she decreases 25 mg each month in addition to the Li 300 mg) current dose is a total of 700 mg daily, Disp: 120 mL, Rfl: 1   LORazepam (ATIVAN) 0.5 MG tablet, TAKE 1/2 TO 1 TABLET BY MOUTH TWICE A DAY AS NEEDED FOR ANXIETY (Patient not taking: Reported on 11/14/2021), Disp: 30 tablet, Rfl: 5   pregabalin (LYRICA) 25 MG capsule, Take 25 mg by mouth 2 (two) times daily.  (Patient not taking: Reported on 02/15/2021), Disp: , Rfl:    progesterone (PROMETRIUM) 200 MG capsule, Take 200 mg by mouth daily. (Patient not taking: Reported on 03/27/2021), Disp: , Rfl:  Medication Side Effects: none  Family Medical/ Social History: Changes? No  MENTAL HEALTH EXAM:  There were no vitals taken for this visit.There is no height or weight on file to calculate BMI.  General Appearance:  Unable to assess  Eye Contact:   Unable to assess  Speech:  Clear and Coherent and Normal Rate  Volume:  Normal  Mood:  Euthymic  Affect:   Unable to assess  Thought Process:  Goal Directed and Descriptions of Associations: Circumstantial  Orientation:  Full (Time, Place, and Person)  Thought Content: Logical   Suicidal Thoughts:  No  Homicidal Thoughts:  No  Memory:   Fair to good depending on the day.  Judgement:  Good  Insight:  Good  Psychomotor Activity:   Unable to assess    Concentration:  Concentration: Good and Attention Span: Fair  Recall:  Good  Fund of Knowledge: Good  Language: Good  Assets:  Desire for Improvement Financial Resources/Insurance Housing Transportation  ADL's:  Intact  Cognition: WNL  Prognosis:  Good   Gene site test results are scanned in chart under labs 02/22/2021.  Had reduced folic acid conversion.  Neuropsychological testing results in chart  02/28/2021. Assessment summary was intact neurocognitive abilities.  Premorbid intellectual functioning high average.  Intact performance of attention, processing speed, language, visual construction, memory, executive function, relative strength was observed on task of attention and speeded word generation.  Diagnosis and conclusions were multiple concussions, MDD, cluster B personality features, rule out borderline personality disorder.  DBT was recommended.  Per Dr. Avis Epley [facility] health care   DIAGNOSES:    ICD-10-CM   1. Bipolar I disorder ([place])  F31.9     2. PTSD (post-traumatic stress disorder)  F43.10     3. Attention deficit hyperactivity disorder (ADHD), predominantly inattentive type  F90.0     4. Nystagmus  H55.00     5. Insomnia, unspecified type  G47.00       Receiving Psychotherapy: No    RECOMMENDATIONS:  PDMP was reviewed.  Last Adderall filled 10/25/2021.  Ativan filled 07/20/2021.  Belsomra filled 11/06/2021.  Oxycodone, fentanyl, and Soma known to me. I provided 30 minutes of non-face-to-face time during this encounter, including time spent before and after the visit in records review, medical decision making, counseling pertinent to today's visit, and charting.   She is still very gradually weaning off lithium.  She should be able to decrease quicker but she is very sensitive to med changes and does not want to push it.  We will continue slow weaning process of 25 mg daily for several weeks or even a month and then decrease by 25 mg again.  She verbalizes understanding.  Continue Adderall 20 mg, one half p.o. twice a day as needed. Continue Belsomra 20 mg, 1 p.o. nightly. Continue Lamictal 200 mg, 1 po bid. Continue lithium 700 mg daily for now, decrease by 25 mg as noted above. Continue Ativan 0.5 mg, 1/2-1 po bid prn.  Use sparingly. Continue Seroquel 8.3 mg daily, she has compounded. Return in 3 months.  Donnal Moat, PA-C

## 2021-11-15 ENCOUNTER — Telehealth: Payer: Self-pay | Admitting: Physician Assistant

## 2021-11-15 NOTE — Telephone Encounter (Signed)
Pharmacy informed.

## 2021-11-15 NOTE — Telephone Encounter (Signed)
Yes, it's ok to do the Lithium Carbonate.

## 2021-11-15 NOTE — Telephone Encounter (Signed)
The pharmacy does not carry lithium citrate is it ok to get the lithium carbonate in liquid form?They have it and can dispense with the same dosing

## 2021-11-15 NOTE — Telephone Encounter (Signed)
[facility] LVM @ 3:12p. They said they have a script to provide pt with Lithium Citrate, but have been providing he with Lithium Carbonate.  They want to know if it's ok to continue.  Pharmacy [phone]  Next appt 12/19

## 2021-11-23 ENCOUNTER — Other Ambulatory Visit: Payer: Self-pay | Admitting: Physician Assistant

## 2021-12-01 ENCOUNTER — Telehealth: Payer: Self-pay | Admitting: Physician Assistant

## 2021-12-01 NOTE — Telephone Encounter (Signed)
I called pharmacy to authorize an early RF, because I could not get in contact with patient to see if she needed it now or could come get samples. Her regular RF can be filled 10/9 and she said she she was able to salvage 4 pills so that will carry her until Monday. I called the pharmacy to cancel the early RF.

## 2021-12-01 NOTE — Telephone Encounter (Signed)
Please call her pharmacy and ok an early RF on Belsomra

## 2021-12-01 NOTE — Telephone Encounter (Addendum)
Pt called reporting she puts her meds in a case for two weeks at a time. She had just filled. Bottle was leaking and had been for a while before she noticed it, it had wet her belsomra and thyroid med. Pt will be short for 2 weeks. Requesting samples or what can be done. Contact # [phone]. She has photos she will be glad to send.

## 2022-01-22 ENCOUNTER — Ambulatory Visit: Payer: PPO | Admitting: Dermatology

## 2022-01-29 ENCOUNTER — Other Ambulatory Visit: Payer: Self-pay | Admitting: Physician Assistant

## 2022-02-05 ENCOUNTER — Other Ambulatory Visit: Payer: Self-pay | Admitting: Physician Assistant

## 2022-02-13 ENCOUNTER — Other Ambulatory Visit: Payer: Self-pay | Admitting: Physician Assistant

## 2022-02-13 ENCOUNTER — Telehealth (INDEPENDENT_AMBULATORY_CARE_PROVIDER_SITE_OTHER): Payer: PPO | Admitting: Physician Assistant

## 2022-02-13 ENCOUNTER — Encounter: Payer: Self-pay | Admitting: Physician Assistant

## 2022-02-13 DIAGNOSIS — G4700 Insomnia, unspecified: Secondary | ICD-10-CM

## 2022-02-13 DIAGNOSIS — F4310 Post-traumatic stress disorder, unspecified: Secondary | ICD-10-CM | POA: Diagnosis not present

## 2022-02-13 DIAGNOSIS — F319 Bipolar disorder, unspecified: Secondary | ICD-10-CM | POA: Diagnosis not present

## 2022-02-13 DIAGNOSIS — G4733 Obstructive sleep apnea (adult) (pediatric): Secondary | ICD-10-CM

## 2022-02-13 DIAGNOSIS — F900 Attention-deficit hyperactivity disorder, predominantly inattentive type: Secondary | ICD-10-CM

## 2022-02-13 MED ORDER — AMPHETAMINE-DEXTROAMPHET ER 30 MG PO CP24: 30.0000 mg | ORAL_CAPSULE | Freq: Every day | ORAL | 0 refills | Status: DC

## 2022-02-13 NOTE — Progress Notes (Signed)
[facility]  Patient ID: Katherine James,  MRN: [id number]  PCP: Melany Guernsey  Date of Evaluation: 02/13/2022  time spent:25 minutes  Chief Complaint:  Chief Complaint   Anxiety; Depression; Insomnia; Follow-up   Virtual Visit via Telehealth  I connected with patient by telephone, with their informed consent, and verified patient privacy and that I am speaking with the correct person using two identifiers.  I am private, in my office and the patient is at home.  I discussed the limitations, risks, security and privacy concerns of performing an evaluation and management service by telephone video and the availability of in person appointments. I also discussed with the patient that there may be a patient responsible charge related to this service. The patient expressed understanding and agreed to proceed.   I discussed the assessment and treatment plan with the patient. The patient was provided an opportunity to ask questions and all were answered. The patient agreed with the plan and demonstrated an understanding of the instructions.   The patient was advised to call back or seek an in-person evaluation if the symptoms worsen or if the condition fails to improve as anticipated.  I provided 25 minutes of non-face-to-face time during this encounter.  HISTORY/CURRENT STATUS: HPI For routine med check.  Asks to change Adderall to XR. Niece told her it will give her a smoother transition at the end of the day when it wears off. Pt states she's not responding to the 20 mg IR much now and would like to increase dose if possible too.   Patient is able to enjoy things.  Energy and motivation are fair to good depending on the day. No extreme sadness, tearfulness, or feelings of hopelessness.  Sleeps well with the Belsomra. ADLs and personal hygiene are normal.  Appetite has not changed.  Weight is stable.  Is still weaning off the Lithium very slowly. Is down to 600 mg now. Still  wants to get off.  Anxiety is still an issue at times but no panic attacks. Denies suicidal or homicidal thoughts.  Patient denies increased energy with decreased need for sleep, increased talkativeness, racing thoughts, impulsivity or risky behaviors, increased spending, increased libido, grandiosity, increased irritability or anger, paranoia, or hallucinations.  Denies dizziness, syncope, seizures, numbness, tingling, tics, unsteady gait, slurred speech, confusion.  Has chronic muscle and joint pain secondary to fibromyalgia and arthritis.  Still has issues with vision, no change. See old notes.  Individual Medical History/ Review of Systems: Changes? :Yes    going to the ER after this appt. Thinks she has diverticulitis. Dx w/ UTI a few wks ago but it feels different type of pain.   Past medications for mental health diagnoses include: Seroquel, Lamictal, lithium, Provigil, Nuvigil, Adderall, mirtazapine, Celexa, Depakote, Wellbutrin, Prozac, Paxil, desipramine, [place] caused rash, had 2 treatments of Spravato with Dr. Toy Care   Allergies: Renee Harder [deutetrabenazine], Alprazolam, Amoxicillin-pot clavulanate, Celecoxib, Chlorpromazine hcl, Codeine, Depakote [divalproex sodium], Desipramine, Diclofenac sodium, Doxycycline, Etodolac, Fluoxetine hcl, Fluticasone propionate, Ibuprofen, Influenza vaccines, Iodine, Ketorolac tromethamine, Levofloxacin, Meperidine hcl, Nabumetone, Naproxen, Neurontin [gabapentin], Nsaids, Olive oil, Oxaprozin, Paroxetine, Phenergan [promethazine hcl], Piroxicam, Pneumococcal vaccines, Rofecoxib, Skelaxin [metaxalone], Sulfamethoxazole-trimethoprim, Sulfasalazine, Sulindac, Symbicort [budesonide-formoterol fumarate], Terfenadine, Tetracycline, Tramadol hcl, Wellbutrin [bupropion], Zinc, and Lisinopril  Current Medications:  Current Outpatient Medications:    [START ON 02/16/2022] amphetamine-dextroamphetamine (ADDERALL XR) 30 MG 24 hr capsule, Take 1 capsule (30 mg total) by  mouth daily., Disp: 30 capsule, Rfl: 0   BELSOMRA 20 MG TABS, TAKE 1  TABLET BY MOUTH AT BEDTIME AS NEEDED, Disp: 30 tablet, Rfl: 5   carisoprodol (SOMA) 350 MG tablet, Take 3 tablets daily, Disp: , Rfl:    lamoTRIgine (LAMICTAL) 200 MG tablet, TAKE 2 TABLETS BY MOUTH EVERY DAY, Disp: 180 tablet, Rfl: 1   levothyroxine (SYNTHROID) 50 MCG tablet, Take 50 mcg by mouth daily before breakfast., Disp: , Rfl:    lithium 300 MG tablet, Take 2.5 tablets (750 mg total) by mouth at bedtime. (Patient taking differently: Take 600 mg by mouth at bedtime.), Disp: 90 tablet, Rfl: 0   lithium carbonate 300 MG capsule, Take 2 capsules (600 mg total) by mouth at bedtime., Disp: 60 capsule, Rfl: 1   ondansetron (ZOFRAN) 4 MG tablet, TAKE 1 AND 1/2 TABLETS BY MOUTH DAILY FOR NAUSEA, Disp: 45 tablet, Rfl: 0   oxyCODONE (OXY IR/ROXICODONE) 5 MG immediate release tablet, Take 10 mg daily as needed by mouth for severe pain or breakthrough pain. , Disp: , Rfl:    UNABLE TO FIND, DHEA SR (compound)- take 17 mg (1 tablet) daily except Saturday and Sunday, Disp: , Rfl:    UNABLE TO FIND, Hormone Topical Cream (Compound): Bi-Est (8:2) 1 mg, apply 1 gram every morning, Disp: , Rfl:    UNABLE TO FIND, Fentenyl Pain Patch, apply one 12 mcg patch every 3 days for chronic pain, Disp: , Rfl:    UNABLE TO FIND, Nature's Thryoid- take 3 g M, W and F, and 2 g every other day, Disp: , Rfl:    UNABLE TO FIND, Progesterone SR 300 mg (compound), take 1 tablet daily at bedtime, Disp: , Rfl:    UNABLE TO FIND, Seroquel (Compound)- 8.3 mg per mL, take 1 mL daily at bedtime, Disp: , Rfl:    UNABLE TO FIND, Pregnenolone 90 mg (compound), take 1 tablet daily, Disp: , Rfl:    UNABLE TO FIND, Testosterone Cream 0.5%- 1g/70m, Disp: , Rfl:    hydrochlorothiazide (HYDRODIURIL) 25 MG tablet, TAKE 1 TABLET BY MOUTH ONCE DAILY. (Patient not taking: Reported on 05/30/2020), Disp: 30 tablet, Rfl: 1   lithium citrate 300 MG/5ML solution, Use as directed (she  decreases 25 mg each month in addition to the Li 300 mg) current dose is a total of 700 mg daily (Patient not taking: Reported on 02/13/2022), Disp: 120 mL, Rfl: 1   LORazepam (ATIVAN) 0.5 MG tablet, TAKE 1/2 TO 1 TABLET BY MOUTH TWICE A DAY AS NEEDED FOR ANXIETY (Patient not taking: Reported on 11/14/2021), Disp: 30 tablet, Rfl: 5   pregabalin (LYRICA) 25 MG capsule, Take 25 mg by mouth 2 (two) times daily.  (Patient not taking: Reported on 02/15/2021), Disp: , Rfl:    Pregnenolone Micronized (PREGNENOLONE PO), Take by mouth., Disp: , Rfl:    progesterone (PROMETRIUM) 200 MG capsule, Take 200 mg by mouth daily. (Patient not taking: Reported on 03/27/2021), Disp: , Rfl:  Medication Side Effects: none  Family Medical/ Social History: Changes? No  MENTAL HEALTH EXAM:  There were no vitals taken for this visit.There is no height or weight on file to calculate BMI.  General Appearance:  Unable to assess  Eye Contact:   Unable to assess  Speech:  Clear and Coherent and Normal Rate  Volume:  Normal  Mood:  Euthymic  Affect:   Unable to assess  Thought Process:  Goal Directed and Descriptions of Associations: Circumstantial  Orientation:  Full (Time, Place, and Person)  Thought Content: Logical   Suicidal Thoughts:  No  Homicidal Thoughts:  No  Memory:   Fair to good depending on the day.  Judgement:  Good  Insight:  Good  Psychomotor Activity:   Unable to assess    Concentration:  Concentration: Fair and Attention Span: Fair  Recall:  Good  Fund of Knowledge: Good  Language: Good  Assets:  Desire for Improvement Financial Resources/Insurance Housing Transportation  ADL's:  Intact  Cognition: WNL  Prognosis:  Good   Gene site test results are scanned in chart under labs 02/22/2021.  Had reduced folic acid conversion.  Neuropsychological testing results in chart 02/28/2021. Assessment summary was intact neurocognitive abilities.  Premorbid intellectual functioning high average.  Intact  performance of attention, processing speed, language, visual construction, memory, executive function, relative strength was observed on task of attention and speeded word generation.  Diagnosis and conclusions were multiple concussions, MDD, cluster B personality features, rule out borderline personality disorder.  DBT was recommended.  Per Dr. Avis Epley [facility] health care  DIAGNOSES:    ICD-10-CM   1. Bipolar I disorder ([place])  F31.9     2. PTSD (post-traumatic stress disorder)  F43.10     3. Attention deficit hyperactivity disorder (ADHD), predominantly inattentive type  F90.0     4. Insomnia, unspecified type  G47.00     5. Obstructive sleep apnea  G47.33       Receiving Psychotherapy: No    RECOMMENDATIONS:  PDMP was reviewed.  Last Adderall filled 01/19/2022.  Ativan filled 07/20/2021.  Belsomra filled 11/06/2021.  Oxycodone, fentanyl, and Soma known to me. I provided 25 minutes of non-face-to-face time during this encounter, including time spent before and after the visit in records review, medical decision making, counseling pertinent to today's visit, and charting.   Ok to change Adderall to XR and increase dose. She'll call in about 3.5 wks to let me know how she's responded to it and I'll send in the next few months prescriptions.   Change Adderall to XR 30 mg, 1 p.o. every morning. Continue Belsomra 20 mg, 1 p.o. nightly. Continue Lamictal 200 mg, 1 po bid. Continue lithium 600 mg daily. She will cont very slow wean by 25 mg every few weeks, b/c she's sensitive to med changes.  Continue Ativan 0.5 mg, 1/2-1 po bid prn.  Use sparingly. Continue Seroquel 8.3 mg daily, she has compounded. Return in 3 months.  Donnal Moat, PA-C

## 2022-02-14 ENCOUNTER — Encounter: Payer: Self-pay | Admitting: Dermatology

## 2022-02-14 ENCOUNTER — Ambulatory Visit: Payer: PPO | Admitting: Dermatology

## 2022-02-14 VITALS — BP 158/80 | HR 72

## 2022-02-14 DIAGNOSIS — L820 Inflamed seborrheic keratosis: Secondary | ICD-10-CM | POA: Diagnosis not present

## 2022-02-14 DIAGNOSIS — D229 Melanocytic nevi, unspecified: Secondary | ICD-10-CM

## 2022-02-14 DIAGNOSIS — D2262 Melanocytic nevi of left upper limb, including shoulder: Secondary | ICD-10-CM | POA: Diagnosis not present

## 2022-02-14 NOTE — Patient Instructions (Signed)
Due to recent changes in healthcare laws, you may see results of your pathology and/or laboratory studies on MyChart before the doctors have had a chance to review them. We understand that in some cases there may be results that are confusing or concerning to you. Please understand that not all results are received at the same time and often the doctors may need to interpret multiple results in order to provide you with the best plan of care or course of treatment. Therefore, we ask that you please give us 2 business days to thoroughly review all your results before contacting the office for clarification. Should we see a critical lab result, you will be contacted sooner.   If You Need Anything After Your Visit  If you have any questions or concerns for your doctor, please call our main line at [phone] and press option 4 to reach your doctor's medical assistant. If no one answers, please leave a voicemail as directed and we will return your call as soon as possible. Messages left after 4 pm will be answered the following business day.   You may also send us a message via MyChart. We typically respond to MyChart messages within 1-2 business days.  For prescription refills, please ask your pharmacy to contact our office. Our fax number is [phone].  If you have an urgent issue when the clinic is closed that cannot wait until the next business day, you can page your doctor at the number below.    Please note that while we do our best to be available for urgent issues outside of office hours, we are not available 24/7.   If you have an urgent issue and are unable to reach us, you may choose to seek medical care at your doctor's office, retail clinic, urgent care center, or emergency room.  If you have a medical emergency, please immediately call 911 or go to the emergency department.  Pager Numbers  - Dr. Kowalski: [phone]  - Dr. Moye: [phone]  - Dr. Stewart:  [phone]  In the event of inclement weather, please call our main line at [phone] for an update on the status of any delays or closures.  Dermatology Medication Tips: Please keep the boxes that topical medications come in in order to help keep track of the instructions about where and how to use these. Pharmacies typically print the medication instructions only on the boxes and not directly on the medication tubes.   If your medication is too expensive, please contact our office at [phone] option 4 or send us a message through MyChart.   We are unable to tell what your co-pay for medications will be in advance as this is different depending on your insurance coverage. However, we may be able to find a substitute medication at lower cost or fill out paperwork to get insurance to cover a needed medication.   If a prior authorization is required to get your medication covered by your insurance company, please allow us 1-2 business days to complete this process.  Drug prices often vary depending on where the prescription is filled and some pharmacies may offer cheaper prices.  The website www.goodrx.com contains coupons for medications through different pharmacies. The prices here do not account for what the cost may be with help from insurance (it may be cheaper with your insurance), but the website can give you the price if you did not use any insurance.  - You can print the associated coupon and take it with   your prescription to the pharmacy.  - You may also stop by our office during regular business hours and pick up a GoodRx coupon card.  - If you need your prescription sent electronically to a different pharmacy, notify our office through [facility] MyChart or by phone at [phone] option 4.     Si Usted Necesita Algo Despus de Su Visita  Tambin puede enviarnos un mensaje a travs de MyChart. Por lo general respondemos a los mensajes de MyChart en el transcurso de 1 a 2  das hbiles.  Para renovar recetas, por favor pida a su farmacia que se ponga en contacto con nuestra oficina. Nuestro nmero de fax es el [phone].  Si tiene un asunto urgente cuando la clnica est cerrada y que no puede esperar hasta el siguiente da hbil, puede llamar/localizar a su doctor(a) al nmero que aparece a continuacin.   Por favor, tenga en cuenta que aunque hacemos todo lo posible para estar disponibles para asuntos urgentes fuera del horario de oficina, no estamos disponibles las 24 horas del da, los 7 das de la semana.   Si tiene un problema urgente y no puede comunicarse con nosotros, puede optar por buscar atencin mdica  en el consultorio de su doctor(a), en una clnica privada, en un centro de atencin urgente o en una sala de emergencias.  Si tiene una emergencia mdica, por favor llame inmediatamente al 911 o vaya a la sala de emergencias.  Nmeros de bper  - Dr. Kowalski: [phone]  - Dra. Moye: [phone]  - Dra. Stewart: [phone]  En caso de inclemencias del tiempo, por favor llame a nuestra lnea principal al [phone] para una actualizacin sobre el estado de cualquier retraso o cierre.  Consejos para la medicacin en dermatologa: Por favor, guarde las cajas en las que vienen los medicamentos de uso tpico para ayudarle a seguir las instrucciones sobre dnde y cmo usarlos. Las farmacias generalmente imprimen las instrucciones del medicamento slo en las cajas y no directamente en los tubos del medicamento.   Si su medicamento es muy caro, por favor, pngase en contacto con nuestra oficina llamando al [phone] y presione la opcin 4 o envenos un mensaje a travs de MyChart.   No podemos decirle cul ser su copago por los medicamentos por adelantado ya que esto es diferente dependiendo de la cobertura de su seguro. Sin embargo, es posible que podamos encontrar un medicamento sustituto a menor costo o llenar un formulario para que el  seguro cubra el medicamento que se considera necesario.   Si se requiere una autorizacin previa para que su compaa de seguros cubra su medicamento, por favor permtanos de 1 a 2 das hbiles para completar este proceso.  Los precios de los medicamentos varan con frecuencia dependiendo del lugar de dnde se surte la receta y alguna farmacias pueden ofrecer precios ms baratos.  El sitio web www.goodrx.com tiene cupones para medicamentos de diferentes farmacias. Los precios aqu no tienen en cuenta lo que podra costar con la ayuda del seguro (puede ser ms barato con su seguro), pero el sitio web puede darle el precio si no utiliz ningn seguro.  - Puede imprimir el cupn correspondiente y llevarlo con su receta a la farmacia.  - Tambin puede pasar por nuestra oficina durante el horario de atencin regular y recoger una tarjeta de cupones de GoodRx.  - Si necesita que su receta se enve electrnicamente a una farmacia diferente, informe a nuestra oficina a travs de MyChart de [place]   o por telfono llamando al [phone] y presione la opcin 4.  

## 2022-02-14 NOTE — Progress Notes (Signed)
   Follow-Up Visit   Subjective  Katherine James is a 61 y.o. female who presents for the following: Skin Problem (Dur: 6 months. Concerning spot on right forearm. Has changed in shape and texture quickly. Also concerned with areas on left upper lip and left hand).  Spot on upper lip is sore.  Spot on L hand is a little more raised.  The patient has spots, moles and lesions to be evaluated, some may be new or changing and the patient has concerns that these could be cancer.   The following portions of the chart were reviewed this encounter and updated as appropriate:      Review of Systems: No other skin or systemic complaints except as noted in HPI or Assessment and Plan.   Objective  Well appearing patient in no apparent distress; mood and affect are within normal limits.  A focused examination was performed including face, right arm, left hand. Relevant physical exam findings are noted in the Assessment and Plan.  Left Hand -Dorsum x1 4 mm thin brown papule  Right wrist x1, Left upper cutaneous lip x2 (3) Erythematous keratotic or waxy stuck-on macule   Assessment & Plan  Nevus Left Hand -Dorsum x1  Benign-appearing.  Observation.  Call clinic for new or changing lesions.  Recommend daily use of broad spectrum spf 30+ sunscreen to sun-exposed areas.    Inflamed seborrheic keratosis (3) Right wrist x1, Left upper cutaneous lip x2  Symptomatic, irritating, patient would like treated.  Destruction of lesion - Right wrist x1, Left upper cutaneous lip x2  Destruction method: cryotherapy   Informed consent: discussed and consent obtained   Lesion destroyed using liquid nitrogen: Yes   Region frozen until ice ball extended beyond lesion: Yes   Outcome: patient tolerated procedure well with no complications   Post-procedure details: wound care instructions given   Additional details:  Prior to procedure, discussed risks of blister formation, small wound, skin dyspigmentation,  or rare scar following cryotherapy. Recommend Vaseline ointment to treated areas while healing.    Return in 5 months (on 07/16/2022) for TBSE.  I, Emelia Salisbury, CMA, am acting as scribe for Brendolyn Patty, MD.  Documentation: I have reviewed the above documentation for accuracy and completeness, and I agree with the above.  Brendolyn Patty MD

## 2022-02-15 NOTE — Telephone Encounter (Signed)
Please refuse

## 2022-03-07 ENCOUNTER — Other Ambulatory Visit: Payer: Self-pay | Admitting: Physician Assistant

## 2022-03-07 NOTE — Telephone Encounter (Signed)
Filled 12/5 appt 3/28

## 2022-03-17 ENCOUNTER — Other Ambulatory Visit: Payer: Self-pay | Admitting: Physician Assistant

## 2022-04-10 IMAGING — US US ABDOMEN LIMITED
2 series · 14 of 25 positions shown · non-contrast
Comparison: Abdominal ultrasound 05/16/2017

CLINICAL DATA: Abnormal LFTs

EXAM:
ULTRASOUND ABDOMEN LIMITED RIGHT UPPER QUADRANT

[Series 1: us abdomen limited · 0.22mm/px · 7 of 69 slices shown (1 of 2)]
[im 1/69]
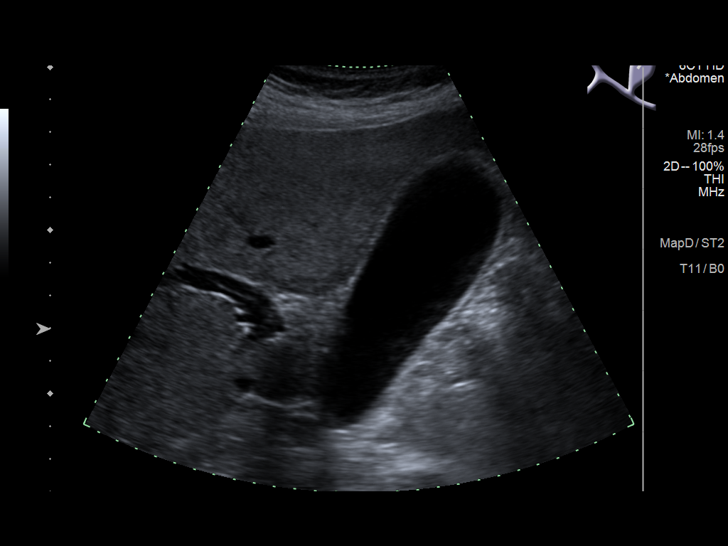
[im 12/69]
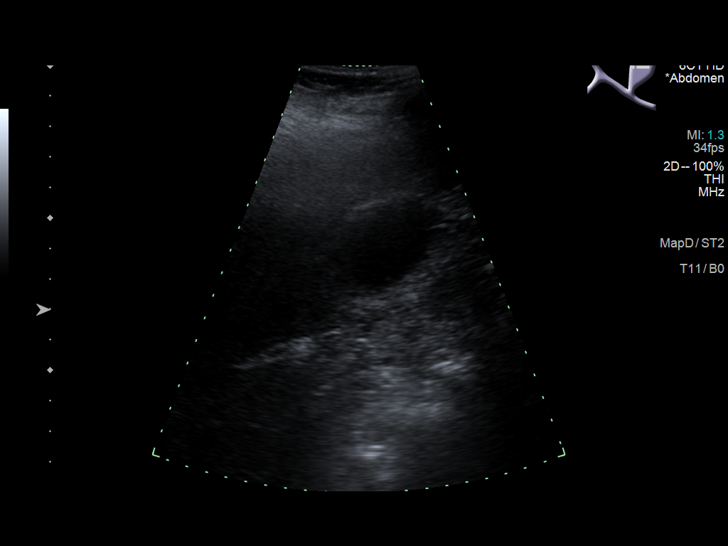
[im 23/69]
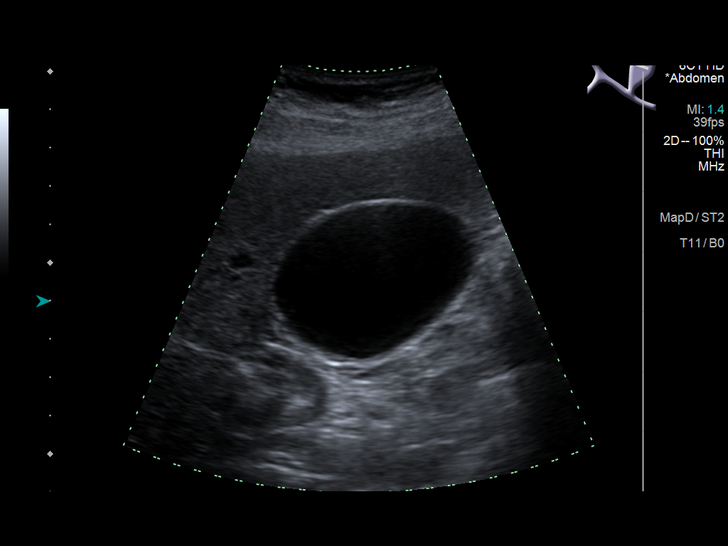
[im 35/69]
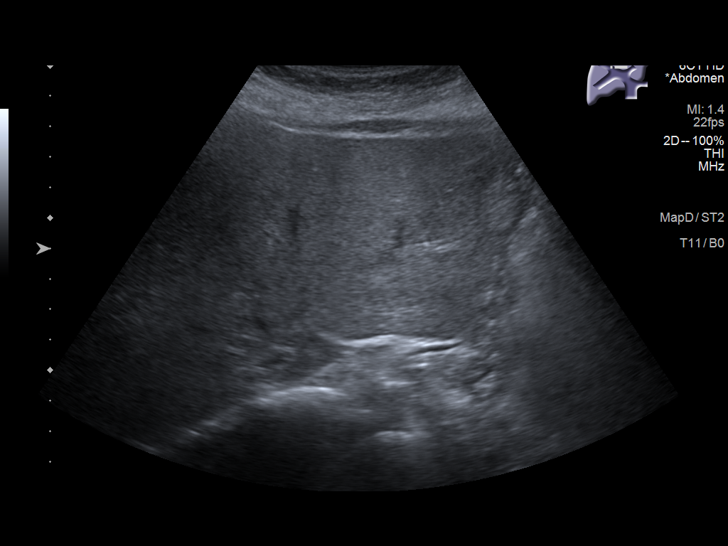
[im 46/69]
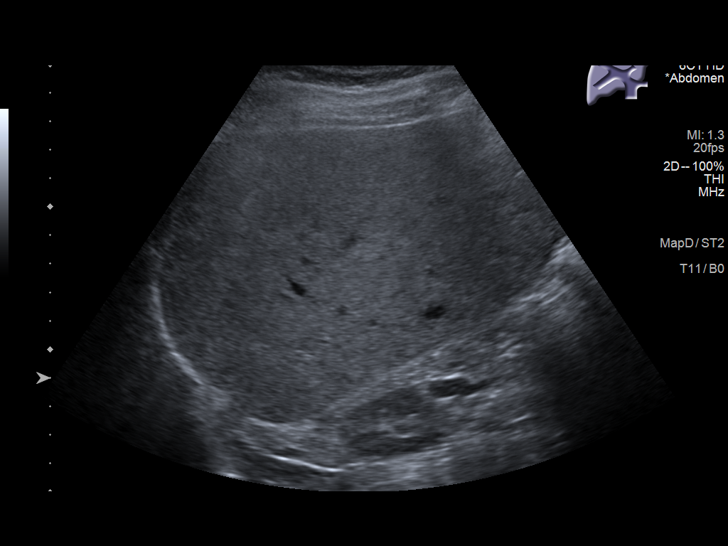
[im 52/69]
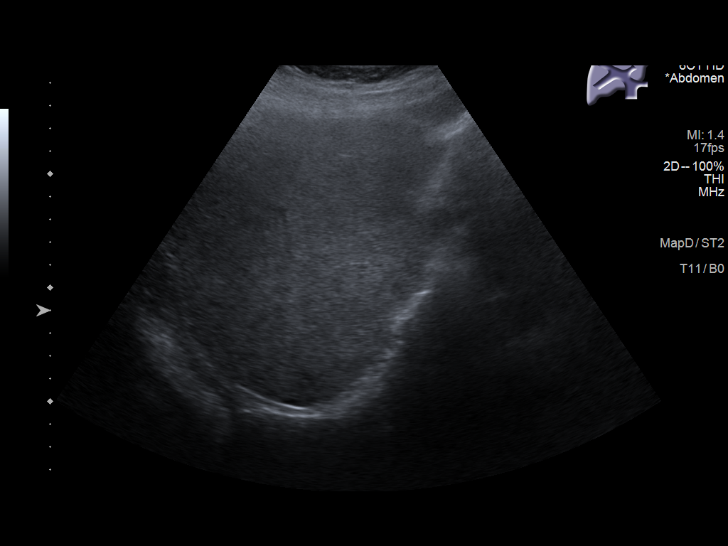
[im 63/69]
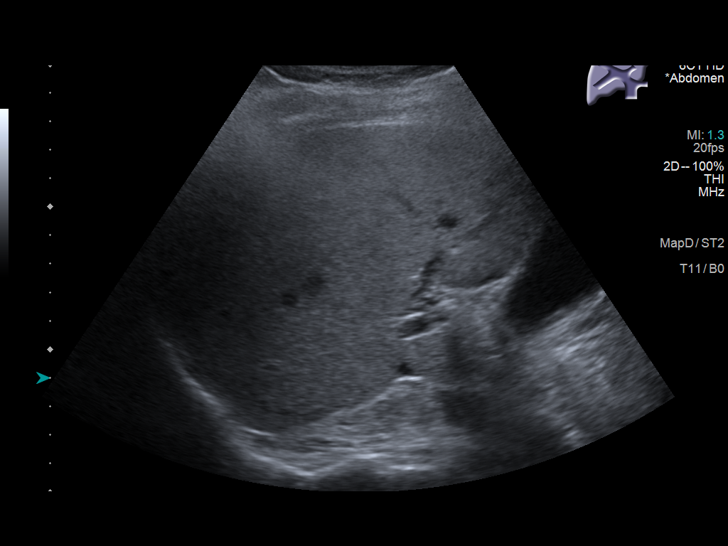

[Series 1: us abdomen limited · 0.23mm/px · 7 of 68 slices shown (2 of 2)]
[im 1/68]
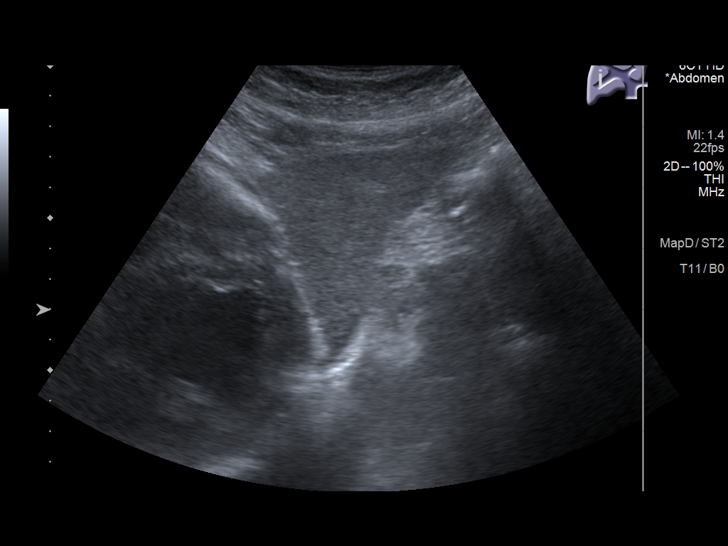
[im 13/68]
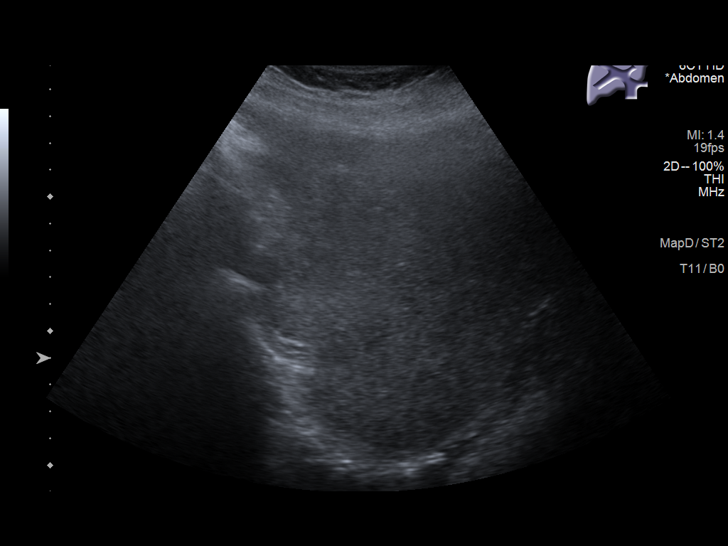
[im 19/68]
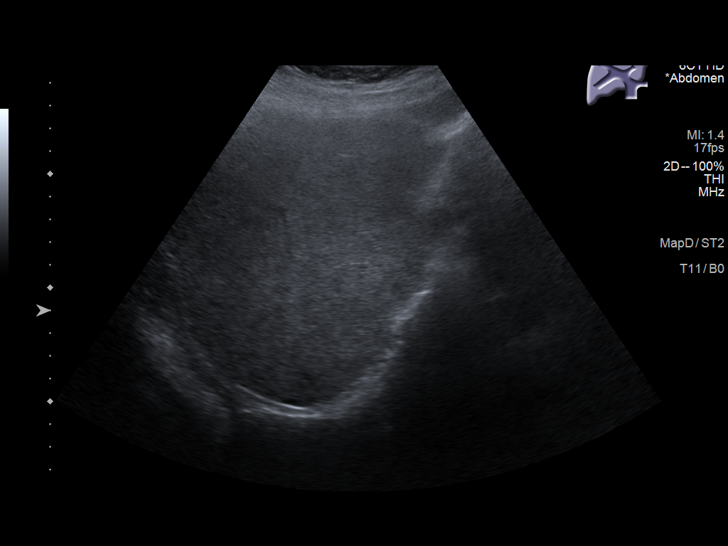
[im 31/68]
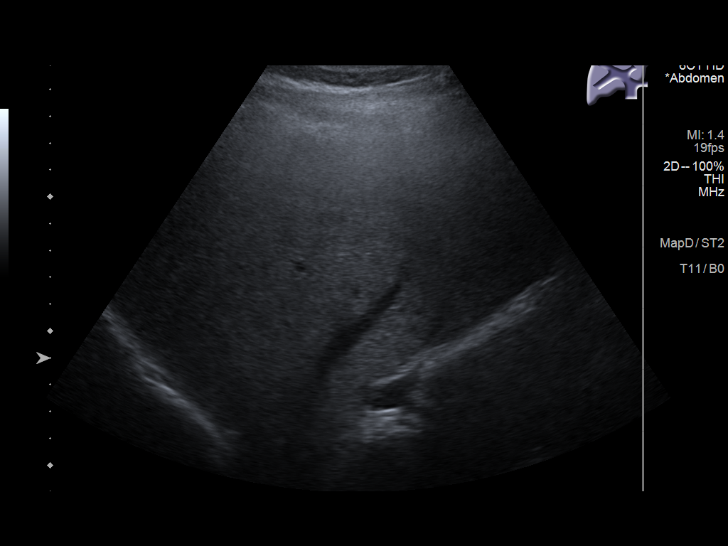
[im 43/68]
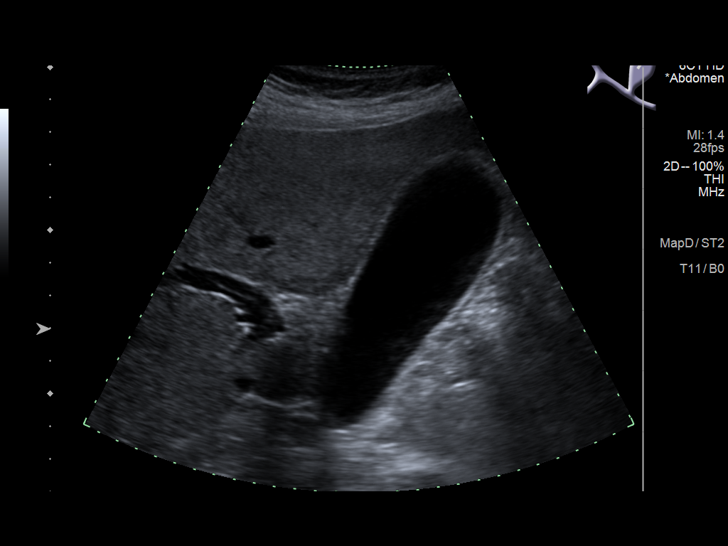
[im 55/68]
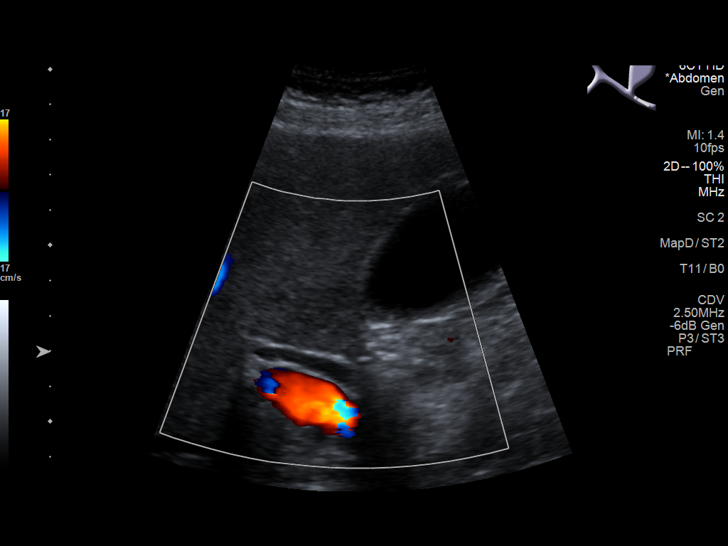
[im 68/68]
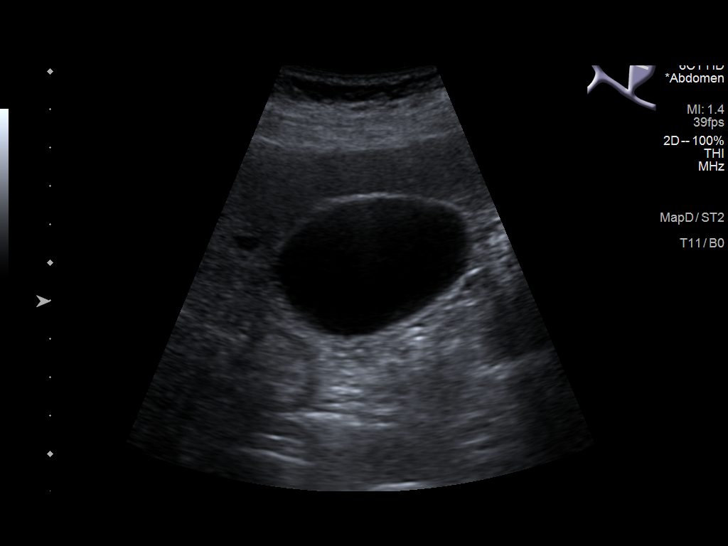

[14 of 25 positions shown; findings below may reference images not displayed]

FINDINGS: Gallbladder:

No gallstones or wall thickening visualized. No sonographic Murphy
sign noted by sonographer.

Common bile duct:

Diameter: 0.5 cm, within normal limits

Liver:

No focal lesion identified. Within normal limits in parenchymal
echogenicity. Portal vein is patent on color Doppler imaging with
normal direction of blood flow towards the liver.

Other: None.
IMPRESSION: Unremarkable sonographic appearance of the right upper quadrant.

## 2022-04-17 ENCOUNTER — Other Ambulatory Visit: Payer: Self-pay | Admitting: Physician Assistant

## 2022-04-27 ENCOUNTER — Other Ambulatory Visit: Payer: Self-pay

## 2022-04-27 ENCOUNTER — Telehealth: Payer: Self-pay | Admitting: Physician Assistant

## 2022-04-27 MED ORDER — LITHIUM CARBONATE 300 MG PO TABS: 600.0000 mg | ORAL_TABLET | Freq: Every evening | ORAL | 0 refills | Status: DC

## 2022-04-27 MED ORDER — LITHIUM CARBONATE 300 MG PO TABS: 600.0000 mg | ORAL_TABLET | Freq: Every evening | ORAL | Status: DC

## 2022-04-27 NOTE — Telephone Encounter (Signed)
Patient called in for refill on lithium '750mg'$ . States she will be out this weekend and needs refill sent to [facility] [address] [facility] Ph: [phone] Appt 3/28

## 2022-05-08 IMAGING — US US CAROTID DUPLEX BILAT
1 series · 13 of 24 positions shown · non-contrast
Comparison: None.

CLINICAL DATA: 60-year-old female with a history nystagmus

EXAM:
BILATERAL CAROTID DUPLEX ULTRASOUND
TECHNIQUE: Gray scale imaging, color Doppler and duplex ultrasound were
performed of bilateral carotid and vertebral arteries in the neck.

[Series 1: us carotid duplex bilat · 13 of 62 slices shown]
[im 1/62]
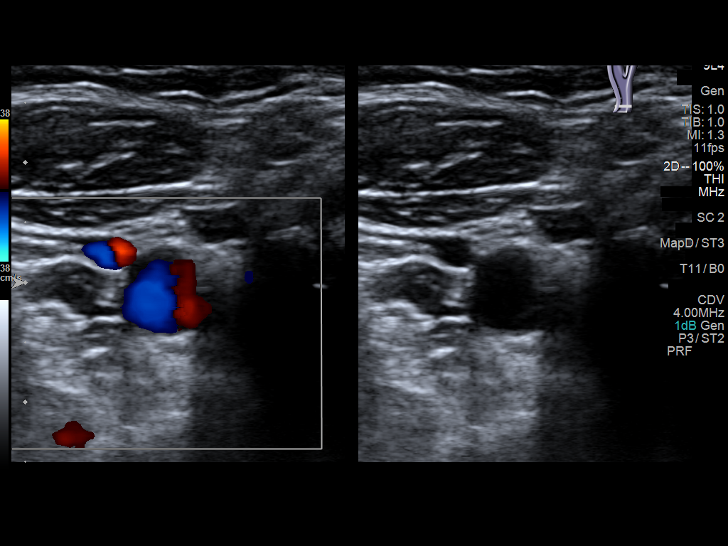
[im 6/62]
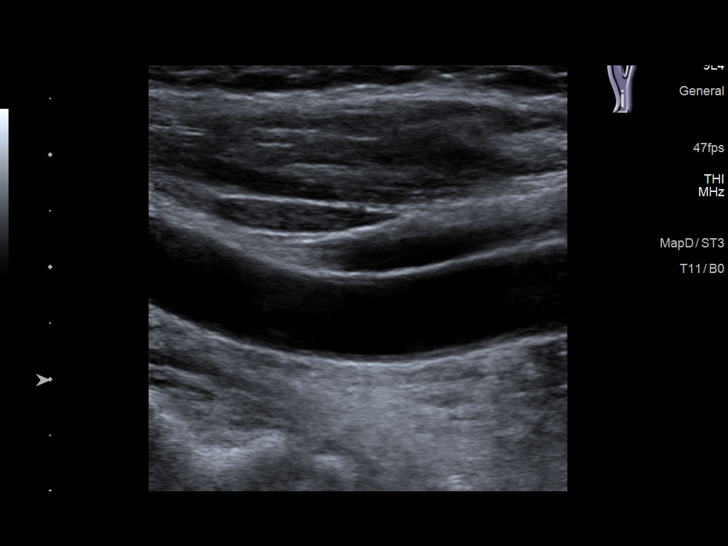
[im 11/62]
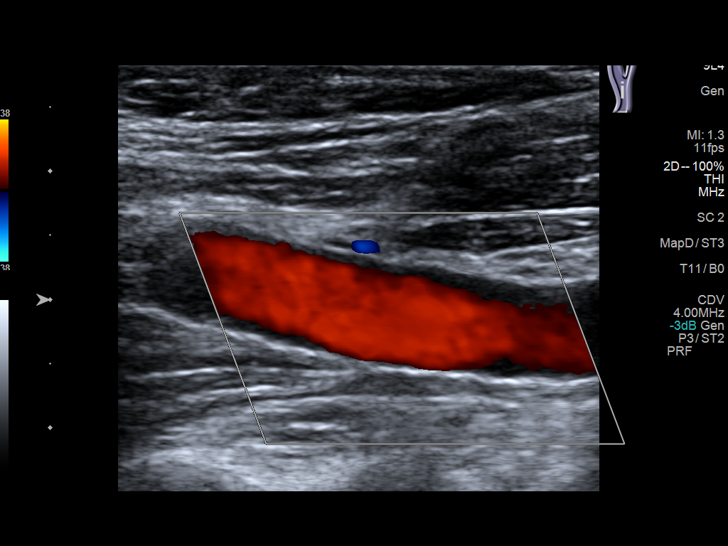
[im 16/62]
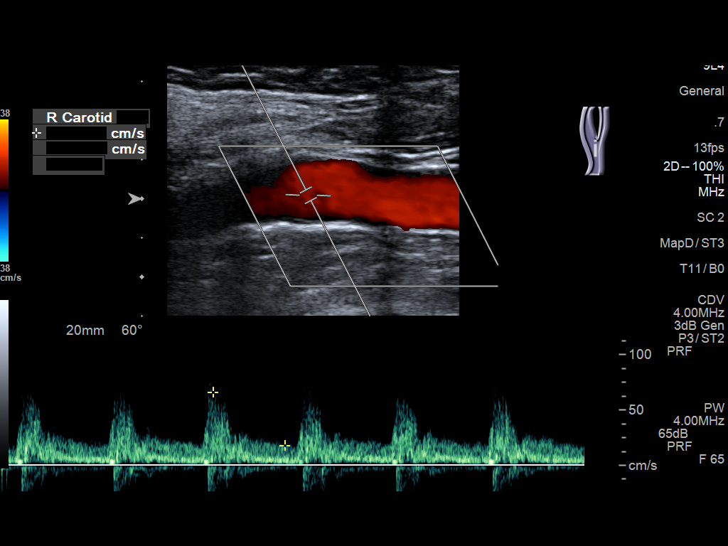
[im 22/62]
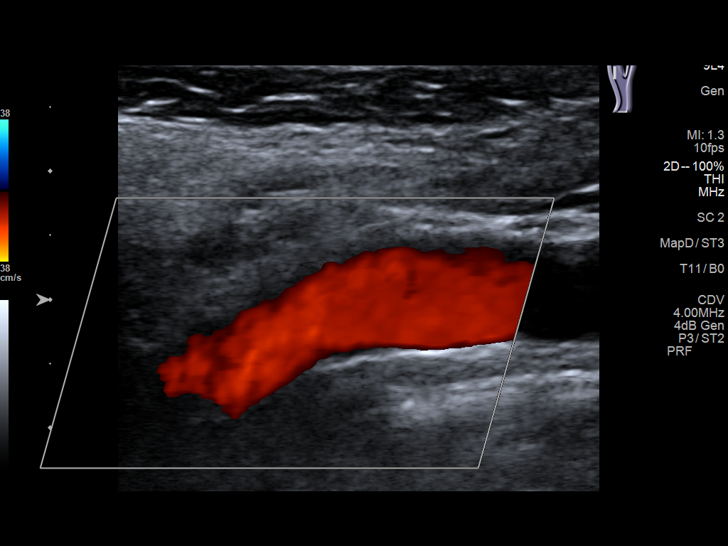
[im 27/62]
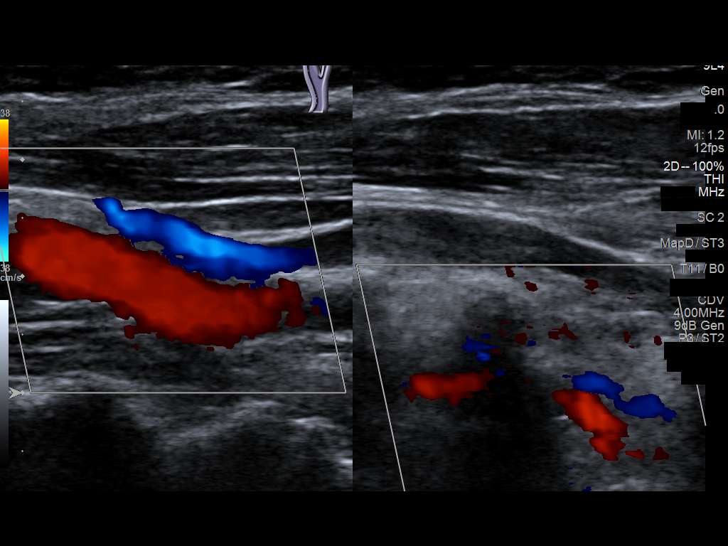
[im 32/62]
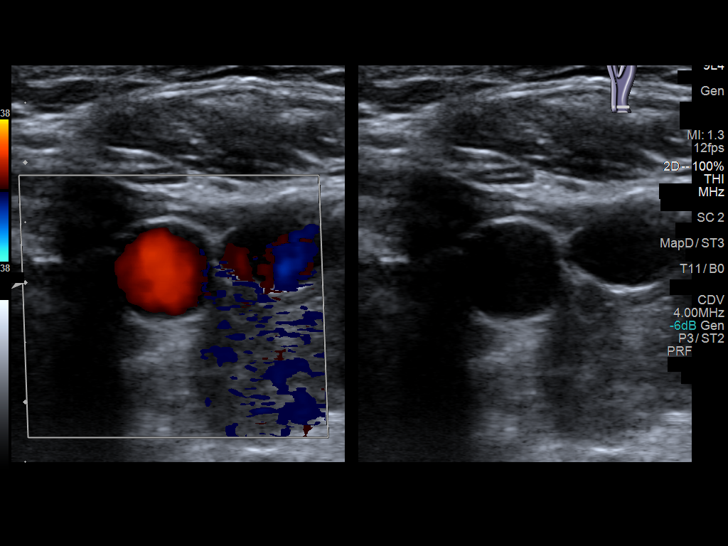
[im 35/62]
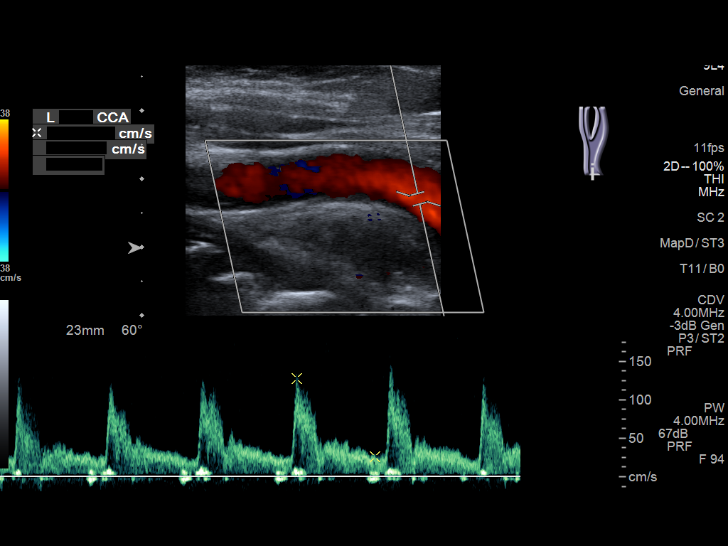
[im 40/62]
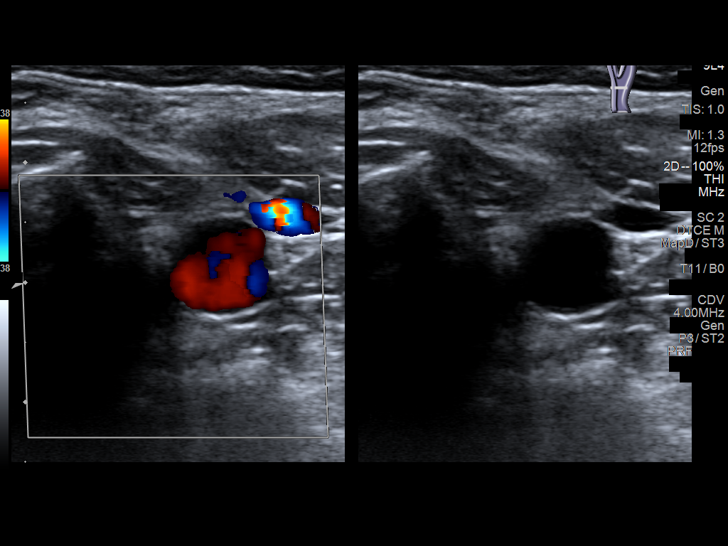
[im 46/62]
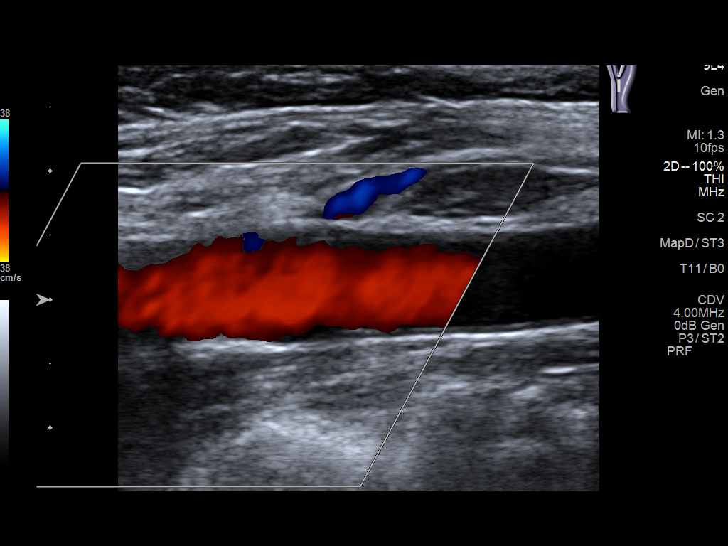
[im 51/62]
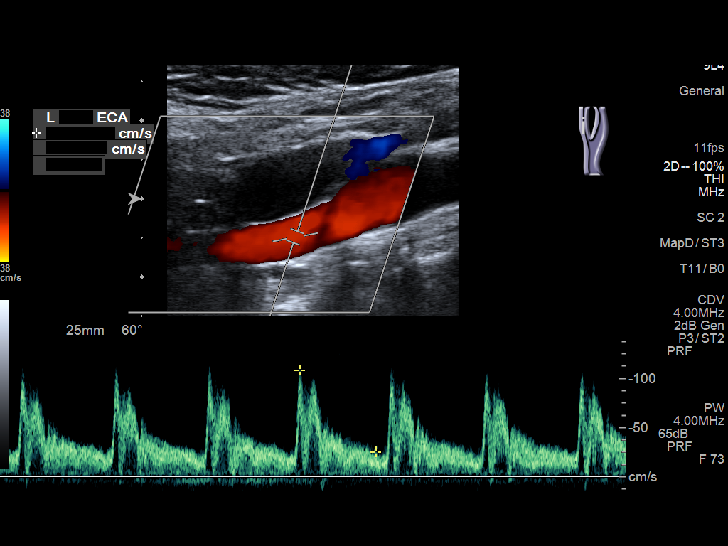
[im 56/62]
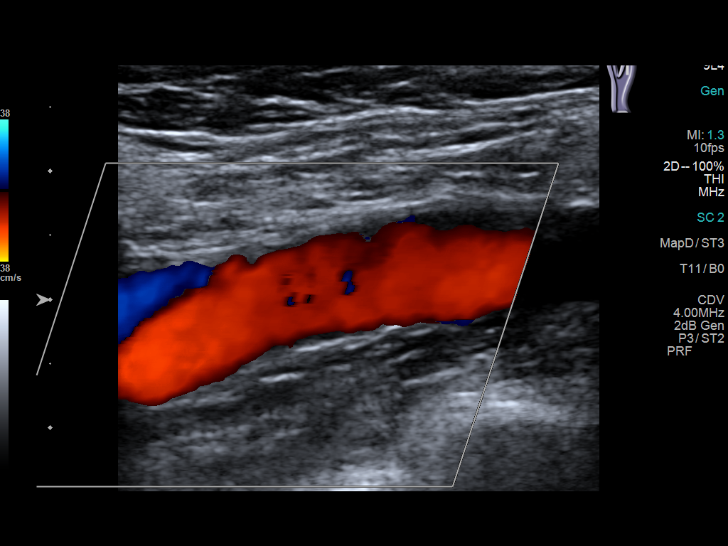
[im 62/62]
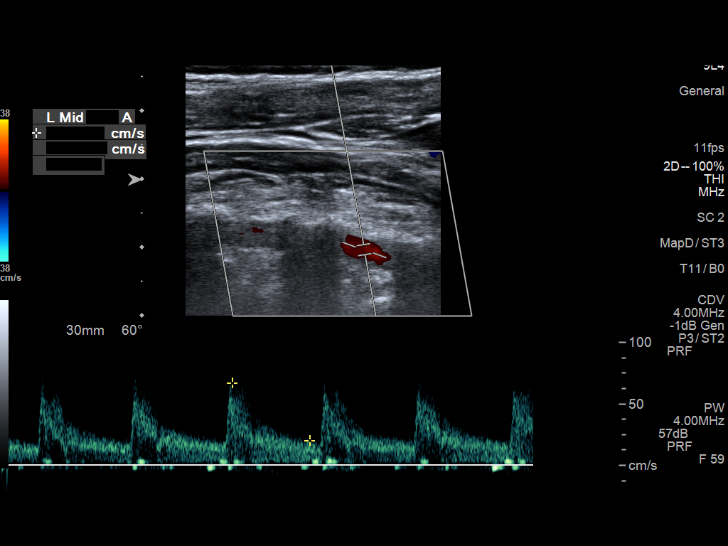

[13 of 24 positions shown; findings below may reference images not displayed]

FINDINGS: Criteria: Quantification of carotid stenosis is based on velocity
parameters that correlate the residual internal carotid diameter
with NASCET-based stenosis levels, using the diameter of the distal
internal carotid lumen as the denominator for stenosis measurement.

The following velocity measurements were obtained:

RIGHT

ICA:  Systolic 99 cm/sec, Diastolic 38 cm/sec

CCA:  91 cm/sec

SYSTOLIC ICA/CCA RATIO:

ECA:  98 cm/sec

LEFT

ICA:  Systolic 81 cm/sec, Diastolic 26 cm/sec

CCA:  89 cm/sec

SYSTOLIC ICA/CCA RATIO:

ECA:  109 cm/sec

Right Brachial SBP: Not acquired

Left Brachial SBP: Not acquired

RIGHT CAROTID ARTERY: No significant calcified disease of the right
common carotid artery. Intermediate waveform maintained.
Heterogeneous plaque without significant calcifications at the right
carotid bifurcation. Low resistance waveform of the right ICA. No
significant tortuosity.

RIGHT VERTEBRAL ARTERY: Antegrade flow with low resistance waveform.

LEFT CAROTID ARTERY: No significant calcified disease of the left
common carotid artery. Intermediate waveform maintained.
Heterogeneous plaque at the left carotid bifurcation without
significant calcifications. Low resistance waveform of the left ICA.

LEFT VERTEBRAL ARTERY:  Antegrade flow with low resistance waveform.
IMPRESSION: Color duplex indicates minimal heterogeneous plaque, with no
hemodynamically significant stenosis by duplex criteria in the
extracranial cerebrovascular circulation.

## 2022-05-10 DIAGNOSIS — G2401 Drug induced subacute dyskinesia: Secondary | ICD-10-CM | POA: Insufficient documentation

## 2022-05-14 ENCOUNTER — Telehealth: Payer: Self-pay

## 2022-05-14 NOTE — Telephone Encounter (Signed)
[place] Review approved BELSOMRA 20mg  tablet for  05/14/22-02/26/2023)[id number]

## 2022-05-14 NOTE — Telephone Encounter (Signed)
Prior Authorization submitted for Belsomra 20 mg tablets with [employer], pending response

## 2022-05-15 ENCOUNTER — Other Ambulatory Visit: Payer: Self-pay | Admitting: Physician Assistant

## 2022-05-15 NOTE — Telephone Encounter (Signed)
Noted  

## 2022-05-18 ENCOUNTER — Telehealth: Payer: Self-pay

## 2022-05-18 MED ORDER — LITHIUM CARBONATE 300 MG PO CAPS: 300.0000 mg | ORAL_CAPSULE | Freq: Every day | ORAL | 0 refills | Status: DC

## 2022-05-18 NOTE — Telephone Encounter (Signed)
Sent!

## 2022-05-18 NOTE — Telephone Encounter (Signed)
Pt called requesting lithium 300 mg  capsules. She is out ,She is currently taking 550 mg. Pharmacy is [facility].

## 2022-05-18 NOTE — Telephone Encounter (Signed)
I always call patient before sending in a RF because of her taper. She is asking for a 90-day supply of 300 mg capsules, not tablets, today.

## 2022-05-24 ENCOUNTER — Ambulatory Visit (INDEPENDENT_AMBULATORY_CARE_PROVIDER_SITE_OTHER): Payer: Medicare PPO | Admitting: Physician Assistant

## 2022-05-24 ENCOUNTER — Encounter: Payer: Self-pay | Admitting: Physician Assistant

## 2022-05-24 DIAGNOSIS — F319 Bipolar disorder, unspecified: Secondary | ICD-10-CM

## 2022-05-24 DIAGNOSIS — F900 Attention-deficit hyperactivity disorder, predominantly inattentive type: Secondary | ICD-10-CM | POA: Diagnosis not present

## 2022-05-24 DIAGNOSIS — G4733 Obstructive sleep apnea (adult) (pediatric): Secondary | ICD-10-CM

## 2022-05-24 DIAGNOSIS — F4310 Post-traumatic stress disorder, unspecified: Secondary | ICD-10-CM | POA: Diagnosis not present

## 2022-05-24 NOTE — Progress Notes (Signed)
[facility]  Patient ID: Katherine James,  MRN: [id number]  PCP: Melany Guernsey  Date of Evaluation: 05/24/2022 time spent:25 minutes  Chief Complaint:  Chief Complaint   Depression; ADD; Follow-up    Virtual Visit via Telehealth  I connected with patient by telephone, with their informed consent, and verified patient privacy and that I am speaking with the correct person using two identifiers.  I am private, in my office and the patient is at home.  I discussed the limitations, risks, security and privacy concerns of performing an evaluation and management service by telephone and the availability of in person appointments. I also discussed with the patient that there may be a patient responsible charge related to this service. The patient expressed understanding and agreed to proceed.   I discussed the assessment and treatment plan with the patient. The patient was provided an opportunity to ask questions and all were answered. The patient agreed with the plan and demonstrated an understanding of the instructions.   The patient was advised to call back or seek an in-person evaluation if the symptoms worsen or if the condition fails to improve as anticipated.  I provided 25 minutes of non-face-to-face time during this encounter.  HISTORY/CURRENT STATUS: HPI For routine med check.  Doing well mentally. She's gradually weaning Seroquel and Lithium.  Her body is so sensitive she has to go slowly.  Patient is able to enjoy things.  Energy and motivation are good.  No extreme sadness, tearfulness, or feelings of hopelessness.  Sleeps well most of the time.  Still takes Belsomra which is helpful.  ADLs and personal hygiene are normal.   Denies any changes in concentration, making decisions, or remembering things.  Adderall is helpful.  She is trying to wean off of it too.  Appetite has not changed.  Weight is stable.  No complaints of anxiety.  Denies suicidal or homicidal  thoughts.  Patient denies increased energy with decreased need for sleep, increased talkativeness, racing thoughts, impulsivity or risky behaviors, increased spending, increased libido, grandiosity, increased irritability or anger, paranoia, or hallucinations.  Denies dizziness, syncope, seizures, numbness, tingling, tics, unsteady gait, slurred speech, confusion.  Has chronic muscle and joint pain secondary to fibromyalgia and arthritis.  Still has issues with vision, no change. See old notes.  Individual Medical History/ Review of Systems: Changes? :No      Past medications for mental health diagnoses include: Seroquel, Lamictal, lithium, Provigil, Nuvigil, Adderall, mirtazapine, Celexa, Depakote, Wellbutrin, Prozac, Paxil, desipramine, [place] caused rash, had 2 treatments of [place]rav[place] with Dr. Toy Care  Allergies: Renee Harder [deutetrabenazine], Alprazolam, Amoxicillin-pot clavulanate, Celecoxib, Chlorpromazine hcl, Codeine, Depakote [divalproex sodium], Desipramine, Diclofenac sodium, Doxycycline, Etodolac, Fluoxetine hcl, Fluticasone propionate, Ibuprofen, Influenza vaccines, Iodine, Ketorolac tromethamine, Levofloxacin, Meperidine hcl, Nabumetone, Naproxen, Neurontin [gabapentin], Nsaids, Olive oil, Oxaprozin, Paroxetine, Phenergan [promethazine hcl], Piroxicam, Pneumococcal vaccines, Rofecoxib, Skelaxin [metaxalone], Sulfamethoxazole-trimethoprim, Sulfasalazine, Sulindac, Symbicort [budesonide-formoterol fumarate], Terfenadine, Tetracycline, Tramadol hcl, Wellbutrin [bupropion], Zinc, and Lisinopril  Current Medications:  Current Outpatient Medications:    amphetamine-dextroamphetamine (ADDERALL XR) 30 MG 24 hr capsule, TAKE 1 CAPSULE BY MOUTH DAILY, Disp: 30 capsule, Rfl: 0   carisoprodol (SOMA) 350 MG tablet, Take 3 tablets daily, Disp: , Rfl:    lamoTRIgine (LAMICTAL) 200 MG tablet, TAKE 2 TABLETS BY MOUTH EVERY DAY, Disp: 180 tablet, Rfl: 1   levothyroxine (SYNTHROID) 50 MCG tablet, Take 50 mcg  by mouth daily before breakfast., Disp: , Rfl:    lithium carbonate 300 MG capsule, Take 1 capsule (300 mg total)  by mouth at bedtime., Disp: 90 capsule, Rfl: 0   lithium citrate 300 MG/5ML solution, Use as directed (she decreases 25 mg each month in addition to the Li 300 mg) current dose is a total of 700 mg daily, Disp: 120 mL, Rfl: 1   ondansetron (ZOFRAN) 4 MG tablet, TAKE 1 AND 1/2 TABLETS BY MOUTH DAILY FOR NAUSEA, Disp: 45 tablet, Rfl: 0   oxyCODONE (OXY IR/ROXICODONE) 5 MG immediate release tablet, Take 10 mg daily as needed by mouth for severe pain or breakthrough pain. , Disp: , Rfl:    progesterone (PROMETRIUM) 200 MG capsule, Take 200 mg by mouth daily., Disp: , Rfl:    Suvorexant (BELSOMRA) 20 MG TABS, TAKE 1 TABLET BY MOUTH AT BEDTIME AS NEEDED (Patient taking differently: Take 15 tablets by mouth at bedtime as needed.), Disp: 30 tablet, Rfl: 5   UNABLE TO FIND, DHEA SR (compound)- take 17 mg (1 tablet) daily except Saturday and Sunday, Disp: , Rfl:    UNABLE TO FIND, Hormone Topical Cream (Compound): Bi-Est (8:2) 1 mg, apply 1 gram every morning, Disp: , Rfl:    UNABLE TO FIND, Fentenyl Pain Patch, apply one 12 mcg patch every 3 days for chronic pain, Disp: , Rfl:    UNABLE TO FIND, Nature's Thryoid- take 3 g M, W and F, and 2 g every other day, Disp: , Rfl:    UNABLE TO FIND, Progesterone SR 300 mg (compound), take 1 tablet daily at bedtime, Disp: , Rfl:    UNABLE TO FIND, Seroquel (Compound)- 8.3 mg per mL, take 1 mL daily at bedtime, Disp: , Rfl:    UNABLE TO FIND, Pregnenolone 90 mg (compound), take 1 tablet daily, Disp: , Rfl:    UNABLE TO FIND, Testosterone Cream 0.5%- 1g/53mL, Disp: , Rfl:    hydrochlorothiazide (HYDRODIURIL) 25 MG tablet, TAKE 1 TABLET BY MOUTH ONCE DAILY. (Patient not taking: Reported on 05/30/2020), Disp: 30 tablet, Rfl: 1   lithium 300 MG tablet, Take 2 tablets (600 mg total) by mouth at bedtime. (Patient not taking: Reported on 05/24/2022), Disp: 60 tablet,  Rfl: 0   LORazepam (ATIVAN) 0.5 MG tablet, TAKE 1/2 TO 1 TABLET BY MOUTH TWICE A DAY AS NEEDED FOR ANXIETY (Patient not taking: Reported on 11/14/2021), Disp: 30 tablet, Rfl: 5   pregabalin (LYRICA) 25 MG capsule, Take 25 mg by mouth 2 (two) times daily.  (Patient not taking: Reported on 02/15/2021), Disp: , Rfl:    Pregnenolone Micronized (PREGNENOLONE PO), Take by mouth., Disp: , Rfl:  Medication Side Effects: none  Family Medical/ Social History: Changes? No  MENTAL HEALTH EXAM:  There were no vitals taken for this visit.There is no height or weight on file to calculate BMI.  General Appearance:  Unable to assess  Eye Contact:   Unable to assess  Speech:  Clear and Coherent, Normal Rate, and Talkative  Volume:  Normal  Mood:  Euthymic  Affect:   Unable to assess  Thought Process:  Goal Directed and Descriptions of Associations: Circumstantial  Orientation:  Full (Time, Place, and Person)  Thought Content: Logical   Suicidal Thoughts:  No  Homicidal Thoughts:  No  Memory:   Fair to good depending on the day.  Judgement:  Good  Insight:  Good  Psychomotor Activity:   Unable to assess    Concentration:  Concentration: Good and Attention Span: Fair  Recall:  Good  Fund of Knowledge: Good  Language: Good  Assets:  Desire for Improvement Financial Resources/Insurance  Housing Transportation  ADL's:  Intact  Cognition: WNL  Prognosis:  Good   Gene site test results are scanned in chart under labs 02/22/2021.  Had reduced folic acid conversion.  Neuropsychological testing results in chart 02/28/2021. Assessment summary was intact neurocognitive abilities.  Premorbid intellectual functioning high average.  Intact performance of attention, processing speed, language, visual construction, memory, executive function, relative strength was observed on task of attention and speeded word generation.  Diagnosis and conclusions were multiple concussions, MDD, cluster B personality features,  rule out borderline personality disorder.  DBT was recommended.  Per Dr. Avis Epley [facility] care  DIAGNOSES:    ICD-10-CM   1. Bipolar I disorder ([place])  F31.9     2. PTSD (post-traumatic stress disorder)  F43.10     3. Attention deficit hyperactivity disorder (ADHD), predominantly inattentive type  F90.0     4. Obstructive sleep apnea  G47.33      Receiving Psychotherapy: No    RECOMMENDATIONS:  PDMP was reviewed.  Last Adderall filled 05/17/2022.  Ativan filled 07/20/2021.  Belsomra filled 04/16/2022.  Oxycodone, fentanyl, and Soma known to me. I provided 25 minutes of non-face-to-face time during this encounter, including time spent before and after the visit in records review, medical decision making, counseling pertinent to today's visit, and charting.   She is doing well so no changes needed.  She will continue to decrease lithium.  Continue Adderall  XR 30 mg, 1 p.o. every morning. Continue Belsomra 20 mg, 1 p.o. nightly. Continue Lamictal 200 mg, 1 po bid. Continue lithium 500 mg daily. She will cont very slow wean by 25 mg every few weeks, b/c she's sensitive to med changes.  Continue Ativan 0.5 mg, 1/2-1 po bid prn.  Use sparingly. Continue Seroquel 8.3 mg daily, she has compounded. Return in 6 months.  Donnal Moat, PA-C

## 2022-06-15 ENCOUNTER — Other Ambulatory Visit: Payer: Self-pay | Admitting: Physician Assistant

## 2022-07-10 ENCOUNTER — Other Ambulatory Visit: Payer: Self-pay | Admitting: Physician Assistant

## 2022-07-11 ENCOUNTER — Other Ambulatory Visit: Payer: Self-pay | Admitting: Physician Assistant

## 2022-07-13 NOTE — Telephone Encounter (Signed)
Patient tapering off lithium. LVM to [facility] to let me know what she needs/if she needs.

## 2022-07-16 ENCOUNTER — Ambulatory Visit (INDEPENDENT_AMBULATORY_CARE_PROVIDER_SITE_OTHER): Payer: Medicare PPO | Admitting: Podiatry

## 2022-07-16 ENCOUNTER — Ambulatory Visit (INDEPENDENT_AMBULATORY_CARE_PROVIDER_SITE_OTHER): Payer: Medicare PPO

## 2022-07-16 ENCOUNTER — Encounter: Payer: Self-pay | Admitting: Podiatry

## 2022-07-16 DIAGNOSIS — G43909 Migraine, unspecified, not intractable, without status migrainosus: Secondary | ICD-10-CM | POA: Insufficient documentation

## 2022-07-16 DIAGNOSIS — D352 Benign neoplasm of pituitary gland: Secondary | ICD-10-CM | POA: Insufficient documentation

## 2022-07-16 DIAGNOSIS — M898X7 Other specified disorders of bone, ankle and foot: Secondary | ICD-10-CM

## 2022-07-16 DIAGNOSIS — M778 Other enthesopathies, not elsewhere classified: Secondary | ICD-10-CM | POA: Diagnosis not present

## 2022-07-16 DIAGNOSIS — D649 Anemia, unspecified: Secondary | ICD-10-CM | POA: Insufficient documentation

## 2022-07-16 DIAGNOSIS — J45909 Unspecified asthma, uncomplicated: Secondary | ICD-10-CM | POA: Insufficient documentation

## 2022-07-16 DIAGNOSIS — K5649 Other impaction of intestine: Secondary | ICD-10-CM | POA: Insufficient documentation

## 2022-07-16 DIAGNOSIS — K5792 Diverticulitis of intestine, part unspecified, without perforation or abscess without bleeding: Secondary | ICD-10-CM | POA: Insufficient documentation

## 2022-07-17 ENCOUNTER — Other Ambulatory Visit: Payer: Self-pay | Admitting: Physician Assistant

## 2022-07-17 NOTE — Progress Notes (Signed)
Subjective:  Patient ID: Katherine James, female    DOB: 04-Feb-1961,  MRN: [id number] HPI Chief Complaint  Patient presents with   Toe Pain    Hallux right - thick, hard callus tip of toe x years, causes sharp pain at times, especially when pressed on, thick toenail, been treated for fungus and ingrowns by previous podiatrist in [place]-wanted a new evaluation and second opinion by someone else   New Patient (Initial Visit)    62 y.o. female presents with the above complaint.   ROS: Denies fever chills nausea vomiting muscle aches pains calf pain back pain chest pain shortness of breath.  Past Medical History:  Diagnosis Date   Adrenal failure (HCC)    now classified as adrenal fatigue   Allergy    Basal cell carcinoma    Bipolar 1 disorder (HCC)    Chondromalacia    Dysplastic nevus 03/2007   left lower pretibia, marked atypia. Excised 04/25/2007   Dysplastic nevus 01/02/2008   right anterior lateral thigh, excised 03/09/2008   Dysplastic nevus 12/03/2008   left upper arm, moderate atypia   Dysplastic nevus 12/03/2008   left forearm, mild atypia   Dysplastic nevus 05/25/2014   left upper calf, mild atypia   Dysplastic nevus 03/01/2020   R mid back, mod to severe atypia, exc 04/25/20    Fatty liver    Fibromyalgia    Hypothyroidism    OSA (obstructive sleep apnea)    cpap   TMJ syndrome    Past Surgical History:  Procedure Laterality Date   ABDOMINAL HYSTERECTOMY  1998   bilateral breast reduction  1999   bilateral salpingoopherectomy  2003   COLONOSCOPY WITH PROPOFOL N/A 07/10/2017   Procedure: COLONOSCOPY WITH PROPOFOL;  Surgeon: Scot Jun, MD;  Location: [facility] ENDOSCOPY;  Service: Endoscopy;  Laterality: N/A;   HEMORRHOIDECTOMY WITH HEMORRHOID BANDING  1996   LIGAMENT REPAIR  1982   left ankle   LIPOSUCTION  1999   abdomen   PLANTAR FASCIA RELEASE  1994, 1995   bilateral   REDUCTION MAMMAPLASTY Bilateral 1999   TONSILLECTOMY  1978   TUBAL LIGATION   1996    Current Outpatient Medications:    albuterol (VENTOLIN HFA) 108 (90 Base) MCG/ACT inhaler, , Disp: , Rfl:    amphetamine-dextroamphetamine (ADDERALL XR) 30 MG 24 hr capsule, TAKE 1 CAPSULE BY MOUTH DAILY, Disp: 30 capsule, Rfl: 0   carisoprodol (SOMA) 350 MG tablet, Take 3 tablets daily, Disp: , Rfl:    fentaNYL (DURAGESIC) 12 MCG/HR, 1 patch every 3 (three) days., Disp: , Rfl:    lamoTRIgine (LAMICTAL) 200 MG tablet, TAKE 2 TABLETS BY MOUTH EVERY DAY, Disp: 180 tablet, Rfl: 1   levothyroxine (SYNTHROID) 50 MCG tablet, Take 50 mcg by mouth daily before breakfast., Disp: , Rfl:    lithium 300 MG tablet, TAKE 2 TABLETS BY MOUTH DAILY, Disp: 180 tablet, Rfl: 0   lithium carbonate 300 MG capsule, TAKE ONE CAPSULE BY MOUTH AT BEDTIME., Disp: 90 capsule, Rfl: 1   lithium citrate 300 MG/5ML solution, Use as directed (she decreases 25 mg each month in addition to the Li 300 mg) current dose is a total of 700 mg daily, Disp: 120 mL, Rfl: 1   LORazepam (ATIVAN) 0.5 MG tablet, TAKE 1/2 TO 1 TABLET BY MOUTH TWICE A DAY AS NEEDED FOR ANXIETY (Patient not taking: Reported on 11/14/2021), Disp: 30 tablet, Rfl: 5   ondansetron (ZOFRAN) 4 MG tablet, TAKE 1 AND 1/2 TABLETS BY MOUTH DAILY  FOR NAUSEA, Disp: 45 tablet, Rfl: 0   oxyCODONE (OXY IR/ROXICODONE) 5 MG immediate release tablet, Take 10 mg daily as needed by mouth for severe pain or breakthrough pain. , Disp: , Rfl:    progesterone (PROMETRIUM) 200 MG capsule, Take 200 mg by mouth daily., Disp: , Rfl:    Suvorexant (BELSOMRA) 20 MG TABS, TAKE 1 TABLET BY MOUTH AT BEDTIME AS NEEDED (Patient taking differently: Take 15 tablets by mouth at bedtime as needed.), Disp: 30 tablet, Rfl: 5   UNABLE TO FIND, DHEA SR (compound)- take 17 mg (1 tablet) daily except Saturday and Sunday, Disp: , Rfl:    UNABLE TO FIND, Hormone Topical Cream (Compound): Bi-Est (8:2) 1 mg, apply 1 gram every morning, Disp: , Rfl:    UNABLE TO FIND, Nature's Thryoid- take 3 g M, W and  F, and 2 g every other day, Disp: , Rfl:    UNABLE TO FIND, Progesterone SR 300 mg (compound), take 1 tablet daily at bedtime, Disp: , Rfl:    UNABLE TO FIND, Seroquel (Compound)- 8.3 mg per mL, take 1 mL daily at bedtime, Disp: , Rfl:    UNABLE TO FIND, Pregnenolone 90 mg (compound), take 1 tablet daily, Disp: , Rfl:    UNABLE TO FIND, Testosterone Cream 0.5%- 1g/18mL, Disp: , Rfl:   Allergies  Allergen Reactions   Austedo [Deutetrabenazine] Rash   Alprazolam     REACTION: hallucinations   Amoxicillin-Pot Clavulanate     REACTION: abd cramping, nausea, vomit   Aspirin     Other Reaction(s): Not available, Other (See Comments)   Butalbital-Aspirin-Caffeine     Other Reaction(s): Not available   Celecoxib     REACTION: nausea, abd cramping   Chlorpromazine Hcl     REACTION: headaches, spaciness   Clonidine     Other Reaction(s): Not available   Codeine     REACTION: headache   Depakote [Divalproex Sodium] Nausea Only    cramping   Desipramine     Pain: feels like"going to loose my mind"   Diclofenac Sodium     REACTION: nausea, vomit   Doxycycline     REACTION: abd cramping, nausea, vomit   Etodolac     REACTION: abd cramping, nausea, vomit   Fluoxetine Hcl     REACTION: achiness, abd cramping   Fluticasone Propionate     REACTION: nose bleeds   Ibuprofen     REACTION: abd cramping, nausea, vomit   Influenza Vaccines     pt reports swelling, knotting, discoloration, fever 102 and pain with last vaccine   Iodine     Makes skin crawl; heart races   Ketorolac Tromethamine     REACTION: abd cramping, nausea, vomit   Levofloxacin     REACTION: diarrhea, nausea,   Meperidine Hcl     REACTION: headache   Nabumetone     REACTION: nausea, diarrhea   Naproxen     REACTION: abd cramping, nausea, vomit   Neurontin [Gabapentin]     Pain; feels like "loosing my mind"   Nsaids Other (See Comments)    Upset stomach, stomach pain   Olive Oil    Oxaprozin     REACTION: abd  cramping, nausea   Paroxetine     REACTION: racing heart, spaciness   Phenergan [Promethazine Hcl]     Heaviness in chest   Piroxicam     REACTION: nausea   Pneumococcal Vaccines     pt reports swelling, knotting, discoloration, fever 102 and pain  with last vaccine   Rimantadine     Other Reaction(s): Not available   Rofecoxib     REACTION: nausea, vomit, abd cramping   Skelaxin [Metaxalone]     Heaviness in chest   Sulfamethoxazole-Trimethoprim     REACTION: abd cramping, nausea   Sulfasalazine    Sulindac     REACTION: abd cramping, nausea, vomit   Symbicort [Budesonide-Formoterol Fumarate] Other (See Comments)    Dizziness, nausea, and headache    Terfenadine     REACTION: spaciness   Tetracycline     REACTION: abdominal cramping, nausea   Tramadol Hcl     REACTION: abd cramping, nausea, diarrhea   Wellbutrin [Bupropion]     Heart races   Zinc    Ginkgo Biloba Nausea Only    Other Reaction(s): Dizziness   Lisinopril Anxiety and Nausea Only    Other reaction(s): Confusion, Headache, Other (See Comments)   Review of Systems Objective:  There were no vitals filed for this visit.  General: Well developed, nourished, in no acute distress, alert and oriented x3   Dermatological: Skin is warm, dry and supple bilateral. Nails x 10 are well maintained; remaining integument appears unremarkable at this time. There are no open sores, no preulcerative lesions, no rash or signs of infection present.  She has thickening of her toenail hallux right.  She also has a distal medial callosity most likely associated with irritation and friction.  This can pose possibly be associated with the thickened nail and or due to the extended hallux at the level of the interphalangeal joint.  Vascular: Dorsalis Pedis artery and Posterior Tibial artery pedal pulses are 2/4 bilateral with immedate capillary fill time. Pedal hair growth present. No varicosities and no lower extremity edema present  bilateral.   Neruologic: Grossly intact via light touch bilateral. Vibratory intact via tuning fork bilateral. Protective threshold with Semmes Wienstein monofilament diminished to all pedal sites bilateral. Patellar and Achilles deep tendon reflexes 2+ bilateral. No Babinski or clonus noted bilateral.   Musculoskeletal: No gross boney pedal deformities bilateral. No pain, crepitus, or limitation noted with foot and ankle range of motion bilateral. Muscular strength 5/5 in all groups tested bilateral.  Hammertoe deformities right foot worse than left with more rigid deformities on the right.  Hallux right does demonstrate an extension at the level of the interphalangeal joint which could be resulting in the irritation and thickening of the toenail as well as thickening of the callus.  Evaluation of her shoes do demonstrate the tissues appear to be somewhat short with the distal aspect of the toe rubbing shoe.  This is most likely resulting in the callus.  Gait: Unassisted, Nonantalgic.    Radiographs:  Radiographs do not demonstrate any type of osseous abnormalities in the area of question carefully inspecting the distal hallux for exostosis or osteochondroma nonvisible.  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  Also benign skin lesion or callused lesion to the distal medial aspect of the toe.  Plan: Discussed etiology pathology conservative surgical therapies with her and her husband.  Also discussed a deeper toe box in the shoe and a longer shoe.  She understands this is amenable to it I debrided the toenail today for her and thinned it out.  Also thinned the callus down all with the bur.  She will follow-up with [place] on an as-needed basis.     Carlitos Bottino T. [place], [place]

## 2022-08-09 ENCOUNTER — Other Ambulatory Visit: Payer: Self-pay | Admitting: Physician Assistant

## 2022-08-09 NOTE — Telephone Encounter (Signed)
Pt called and said that this month is her mom's birthday month and she will need the ativan to get through this month

## 2022-08-13 ENCOUNTER — Other Ambulatory Visit: Payer: Self-pay | Admitting: Physician Assistant

## 2022-08-20 ENCOUNTER — Other Ambulatory Visit: Payer: Self-pay | Admitting: Physician Assistant

## 2022-08-21 ENCOUNTER — Other Ambulatory Visit: Payer: Self-pay

## 2022-08-21 MED ORDER — AMPHETAMINE-DEXTROAMPHET ER 30 MG PO CP24: 30.0000 mg | ORAL_CAPSULE | Freq: Every day | ORAL | 0 refills | Status: DC

## 2022-08-21 MED ORDER — AMPHETAMINE-DEXTROAMPHET ER 30 MG PO CP24
30.0000 mg | ORAL_CAPSULE | Freq: Every day | ORAL | 0 refills | Status: DC
Start: 2022-09-18 — End: 2022-09-17

## 2022-08-27 ENCOUNTER — Ambulatory Visit: Payer: PPO | Admitting: Dermatology

## 2022-09-17 ENCOUNTER — Telehealth: Payer: Self-pay | Admitting: Physician Assistant

## 2022-09-17 ENCOUNTER — Other Ambulatory Visit: Payer: Self-pay

## 2022-09-17 NOTE — Telephone Encounter (Signed)
LVM to RC 

## 2022-09-17 NOTE — Telephone Encounter (Signed)
Pt lvm that she wants to go down on the adderall. She would 25 mg er or even 20 mg er So please cancel the one for 30 and submit a new script

## 2022-09-18 MED ORDER — AMPHETAMINE-DEXTROAMPHET ER 20 MG PO CP24: 20.0000 mg | ORAL_CAPSULE | Freq: Every day | ORAL | 0 refills | Status: DC

## 2022-09-18 NOTE — Telephone Encounter (Signed)
Pended.

## 2022-09-19 ENCOUNTER — Other Ambulatory Visit: Payer: Self-pay

## 2022-09-19 MED ORDER — AMPHETAMINE-DEXTROAMPHET ER 20 MG PO CP24: 20.0000 mg | ORAL_CAPSULE | Freq: Every day | ORAL | 0 refills | Status: DC

## 2022-09-20 ENCOUNTER — Other Ambulatory Visit: Payer: Self-pay

## 2022-09-20 MED ORDER — AMPHETAMINE-DEXTROAMPHET ER 20 MG PO CP24: 20.0000 mg | ORAL_CAPSULE | Freq: Every day | ORAL | 0 refills | Status: DC

## 2022-09-21 ENCOUNTER — Other Ambulatory Visit: Payer: Self-pay

## 2022-09-21 DIAGNOSIS — F900 Attention-deficit hyperactivity disorder, predominantly inattentive type: Secondary | ICD-10-CM

## 2022-09-21 MED ORDER — AMPHETAMINE-DEXTROAMPHET ER 20 MG PO CP24
20.0000 mg | ORAL_CAPSULE | Freq: Every day | ORAL | 0 refills | Status: DC
Start: 2022-09-21 — End: 2022-09-21

## 2022-09-21 MED ORDER — AMPHETAMINE-DEXTROAMPHET ER 20 MG PO CP24
20.0000 mg | ORAL_CAPSULE | Freq: Every day | ORAL | 0 refills | Status: DC
Start: 2022-09-21 — End: 2022-10-21

## 2022-10-19 ENCOUNTER — Other Ambulatory Visit: Payer: Self-pay | Admitting: Psychiatry

## 2022-10-19 DIAGNOSIS — F900 Attention-deficit hyperactivity disorder, predominantly inattentive type: Secondary | ICD-10-CM

## 2022-10-21 ENCOUNTER — Other Ambulatory Visit: Payer: Self-pay

## 2022-10-21 DIAGNOSIS — F900 Attention-deficit hyperactivity disorder, predominantly inattentive type: Secondary | ICD-10-CM

## 2022-10-22 ENCOUNTER — Other Ambulatory Visit: Payer: Self-pay | Admitting: Psychiatry

## 2022-10-22 ENCOUNTER — Other Ambulatory Visit: Payer: Self-pay

## 2022-10-22 DIAGNOSIS — F900 Attention-deficit hyperactivity disorder, predominantly inattentive type: Secondary | ICD-10-CM

## 2022-10-22 MED ORDER — AMPHETAMINE-DEXTROAMPHET ER 20 MG PO CP24
20.0000 mg | ORAL_CAPSULE | Freq: Every day | ORAL | 0 refills | Status: DC
Start: 2022-11-19 — End: 2022-12-04

## 2022-10-22 MED ORDER — AMPHETAMINE-DEXTROAMPHET ER 20 MG PO CP24: 20.0000 mg | ORAL_CAPSULE | Freq: Every day | ORAL | 0 refills | Status: DC

## 2022-11-16 ENCOUNTER — Telehealth: Payer: Self-pay | Admitting: Physician Assistant

## 2022-11-16 NOTE — Telephone Encounter (Signed)
Next visit is 12/04/22.She got an RX for [employer] but she stopped taking it because it was causing her insomnia again. She has been taking for three nights Ambien 5 mg and said this is the best sleep she has ever had in a long time. Could she get a refill on Amibien 5 mg called to:  [facility] - [place], [place] - [address]   Phone: [phone]  Fax: [phone]

## 2022-11-18 ENCOUNTER — Other Ambulatory Visit: Payer: Self-pay

## 2022-11-18 NOTE — Telephone Encounter (Signed)
Pended.

## 2022-11-19 ENCOUNTER — Other Ambulatory Visit: Payer: Self-pay

## 2022-11-19 MED ORDER — ZOLPIDEM TARTRATE 5 MG PO TABS: 5.0000 mg | ORAL_TABLET | Freq: Every evening | ORAL | 0 refills | Status: DC | PRN

## 2022-11-19 NOTE — Telephone Encounter (Signed)
Katherine James called to check status of refill of Ambien.  The prescription was sent but show it failed to delivery.  Please call it in to the pharmacy.  And please ask for it to be delivered.

## 2022-11-19 NOTE — Telephone Encounter (Signed)
Rosey Bath tried to send, but got a transmission error. Have had this issue with her controlled meds the last couple of months and Dr. Jennelle Human is the only one that is able to get it to go thru. Have repended the Rx to him.

## 2022-11-22 ENCOUNTER — Other Ambulatory Visit: Payer: Self-pay

## 2022-11-22 ENCOUNTER — Other Ambulatory Visit: Payer: Self-pay | Admitting: Physician Assistant

## 2022-11-22 DIAGNOSIS — F900 Attention-deficit hyperactivity disorder, predominantly inattentive type: Secondary | ICD-10-CM

## 2022-11-22 MED ORDER — AMPHETAMINE-DEXTROAMPHET ER 20 MG PO CP24: 20.0000 mg | ORAL_CAPSULE | Freq: Every day | ORAL | 0 refills | Status: DC

## 2022-11-23 ENCOUNTER — Institutional Professional Consult (permissible substitution): Payer: Medicare PPO | Admitting: Plastic Surgery

## 2022-11-23 NOTE — Telephone Encounter (Signed)
It looks like Gina sent this RX on 9/23.  Did it not go through.

## 2022-12-03 ENCOUNTER — Ambulatory Visit: Payer: Medicare PPO | Admitting: Dermatology

## 2022-12-04 ENCOUNTER — Encounter: Payer: Self-pay | Admitting: Physician Assistant

## 2022-12-04 ENCOUNTER — Ambulatory Visit (INDEPENDENT_AMBULATORY_CARE_PROVIDER_SITE_OTHER): Payer: Medicare PPO | Admitting: Physician Assistant

## 2022-12-04 DIAGNOSIS — F319 Bipolar disorder, unspecified: Secondary | ICD-10-CM

## 2022-12-04 DIAGNOSIS — F603 Borderline personality disorder: Secondary | ICD-10-CM | POA: Diagnosis not present

## 2022-12-04 DIAGNOSIS — F4310 Post-traumatic stress disorder, unspecified: Secondary | ICD-10-CM

## 2022-12-04 DIAGNOSIS — G4700 Insomnia, unspecified: Secondary | ICD-10-CM | POA: Diagnosis not present

## 2022-12-04 NOTE — Progress Notes (Signed)
[facility]  Patient ID: Katherine James,  MRN: [id number]  PCP: Angelyn Punt  Date of Evaluation: 12/04/2022 time spent:30 minutes  Chief Complaint:  Chief Complaint   Depression; Anxiety; Insomnia; Follow-up    Virtual Visit via Telehealth  I connected with patient by telephone, with their informed consent, and verified patient privacy and that I am speaking with the correct person using two identifiers.  I am private, in my office and the patient is at home.  I discussed the limitations, risks, security and privacy concerns of performing an evaluation and management service by telephone and the availability of in person appointments. I also discussed with the patient that there may be a patient responsible charge related to this service. The patient expressed understanding and agreed to proceed.   I discussed the assessment and treatment plan with the patient. The patient was provided an opportunity to ask questions and all were answered. The patient agreed with the plan and demonstrated an understanding of the instructions.   The patient was advised to call back or seek an in-person evaluation if the symptoms worsen or if the condition fails to improve as anticipated.  I provided 30 minutes of non-face-to-face time during this encounter.  HISTORY/CURRENT STATUS: HPI For routine med check.  Katherine James states she is doing great.  She continues to do an extremely slow wean of the lithium.  She is now at 300 mg.  She wants to get off of every medicine that may cause tardive dyskinesia, including the Lamictal at some point.  She hopes to not need anything for depression.  But states she has had depression off and on her whole life.  States she is not depressed at all now, she has also gone off Seroquel, she was taking a subtherapeutic dose but felt like it was working.  Now she does not feel like she needs it.  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  Energy and motivation depend on the day and how bad the fibromyalgia is that day.  ADLs and personal hygiene are normal.  Appetite is normal and weight is stable.  She is not crying easily.  No feelings of hopelessness.  No suicidal or homicidal thoughts.  States the Adderall was worsening the nystagmus so she stopped it.  She does have issues with focus and concentration but states she can deal with it.  Patient denies increased energy with decreased need for sleep, increased talkativeness, racing thoughts, impulsivity or risky behaviors, increased spending, increased libido, grandiosity, increased irritability or anger, paranoia, or hallucinations.  Denies dizziness, syncope, seizures, numbness, tingling, tics, unsteady gait, slurred speech, confusion.  Has chronic muscle and joint pain secondary to fibromyalgia and arthritis.  She is still seeing pain management.  Still has issues with vision, no change. See old notes.  Individual Medical History/ Review of Systems: Changes? :No      Past medications for mental health diagnoses include: Seroquel, Lamictal, lithium, Provigil, Nuvigil, Adderall, mirtazapine, Celexa, Depakote, Wellbutrin, Prozac, Paxil, desipramine, Austedo caused rash, had 2 treatments of Spravato with Dr. Evelene Croon  Allergies: Shauna Hugh [deutetrabenazine], Alprazolam, Amoxicillin-pot clavulanate, Aspirin, Butalbital-aspirin-caffeine, Celecoxib, Chlorpromazine hcl, Clonidine, Codeine, Depakote [divalproex sodium], Desipramine, Diclofenac sodium, Doxycycline, Etodolac, Fluoxetine hcl, Fluticasone propionate, Ibuprofen, Influenza vaccines, Iodine, Ketorolac tromethamine, Levofloxacin, Meperidine hcl, Nabumetone, Naproxen, Neurontin [gabapentin], Nsaids, Olive oil, Oxaprozin, Paroxetine, Phenergan [promethazine hcl], Piroxicam, Pneumococcal vaccines, Rimantadine, Rofecoxib, Skelaxin [metaxalone], Sulfamethoxazole-trimethoprim, Sulfasalazine, Sulindac, Symbicort  [budesonide-formoterol fumarate], Terfenadine, Tetracycline, Tramadol  hcl, Wellbutrin [bupropion], Zinc, Ginkgo biloba, and Lisinopril  Current Medications:  Current Outpatient Medications:    carisoprodol (SOMA) 350 MG tablet, Take 3 tablets daily, Disp: , Rfl:    fentaNYL (DURAGESIC) 12 MCG/HR, 1 patch every 3 (three) days., Disp: , Rfl:    lamoTRIgine (LAMICTAL) 200 MG tablet, TAKE 2 TABLETS BY MOUTH DAILY, Disp: 180 tablet, Rfl: 1   levothyroxine (SYNTHROID) 50 MCG tablet, Take 50 mcg by mouth daily before breakfast., Disp: , Rfl:    lithium carbonate 300 MG capsule, TAKE ONE CAPSULE BY MOUTH AT BEDTIME., Disp: 90 capsule, Rfl: 1   LORazepam (ATIVAN) 0.5 MG tablet, TAKE 1/2 TO 1 TABLET BY MOUTH TWICE A DAY AS NEEDED FOR ANXIETY., Disp: 30 tablet, Rfl: 0   oxyCODONE (OXY IR/ROXICODONE) 5 MG immediate release tablet, Take 10 mg daily as needed by mouth for severe pain or breakthrough pain. , Disp: , Rfl:    progesterone (PROMETRIUM) 200 MG capsule, Take 200 mg by mouth daily., Disp: , Rfl:    UNABLE TO FIND, DHEA SR (compound)- take 17 mg (1 tablet) daily except Saturday and Sunday, Disp: , Rfl:    UNABLE TO FIND, Hormone Topical Cream (Compound): Bi-Est (8:2) 1 mg, apply 1 gram every morning, Disp: , Rfl:    UNABLE TO FIND, Nature's Thryoid- take 3 g M, W and F, and 2 g every other day, Disp: , Rfl:    UNABLE TO FIND, Progesterone SR 300 mg (compound), take 1 tablet daily at bedtime, Disp: , Rfl:    UNABLE TO FIND, Pregnenolone 90 mg (compound), take 1 tablet daily, Disp: , Rfl:    UNABLE TO FIND, Testosterone Cream 0.5%- 1g/62mL, Disp: , Rfl:    zolpidem (AMBIEN) 5 MG tablet, Take 1 tablet (5 mg total) by mouth at bedtime as needed for sleep., Disp: 30 tablet, Rfl: 0   albuterol (VENTOLIN HFA) 108 (90 Base) MCG/ACT inhaler, , Disp: , Rfl:    lithium citrate 300 MG/5ML solution, Use as directed (she decreases 25 mg each month in addition to the Li 300 mg) current dose is a total of 700 mg daily  (Patient not taking: Reported on 12/04/2022), Disp: 120 mL, Rfl: 1   ondansetron (ZOFRAN) 4 MG tablet, TAKE 1 AND 1/2 TABLETS BY MOUTH DAILY FOR NAUSEA (Patient not taking: Reported on 12/04/2022), Disp: 45 tablet, Rfl: 0   UNABLE TO FIND, Seroquel (Compound)- 8.3 mg per mL, take 1 mL daily at bedtime (Patient not taking: Reported on 12/04/2022), Disp: , Rfl:  Medication Side Effects: none  Family Medical/ Social History: Changes? No  MENTAL HEALTH EXAM:  There were no vitals taken for this visit.There is no height or weight on file to calculate BMI.  General Appearance:  Unable to assess  Eye Contact:   Unable to assess  Speech:  Clear and Coherent, Normal Rate, and Talkative  Volume:  Normal  Mood:  Euthymic  Affect:   Unable to assess  Thought Process:  Goal Directed and Descriptions of Associations: Circumstantial  Orientation:  Full (Time, Place, and Person)  Thought Content: Logical   Suicidal Thoughts:  No  Homicidal Thoughts:  No  Memory:   Fair to good depending on the day.  Judgement:  Good  Insight:  Good  Psychomotor Activity:   Unable to assess    Concentration:  Concentration: Good and Attention Span: Fair  Recall:  Good  Fund of Knowledge: Good  Language: Good  Assets:  Communication Skills Desire for Improvement Financial  Resources/Insurance Housing Transportation  ADL's:  Intact  Cognition: WNL  Prognosis:  Good   Gene site test results are scanned in chart under labs 02/22/2021.  Had reduced folic acid conversion.  Neuropsychological testing results in chart 02/28/2021. Assessment summary was intact neurocognitive abilities.  Premorbid intellectual functioning high average.  Intact performance of attention, processing speed, language, visual construction, memory, executive function, relative strength was observed on task of attention and speeded word generation.  Diagnosis and conclusions were multiple concussions, MDD, cluster B personality features, rule out  borderline personality disorder.  DBT was recommended.  Per Dr. Elinor Parkinson [facility] health care  DIAGNOSES:    ICD-10-CM   1. Bipolar I disorder (HCC)  F31.9     2. Borderline personality disorder (HCC)  F60.3     3. Insomnia, unspecified type  G47.00     4. PTSD (post-traumatic stress disorder)  F43.10      Receiving Psychotherapy: No    RECOMMENDATIONS:  PDMP was reviewed.  Ambien filled 11/20/2022.  Adderall filled 10/22/2022.  Oxycodone and Soma known to me. I provided 30 minutes of non-face-to-face time during this encounter, including time spent before and after the visit in records review, medical decision making, counseling pertinent to today's visit, and charting.   She could decrease the lithium much quicker than she is doing, it's taking her over a year to get where she is right now, see old notes.  She reports being very sensitive to any changes so we have gone slowly.  As far as going off the Lamictal that will be in the distant future most likely.I think she will need to stay on an antidepressant but she does not want to.  She wants to get off everything that can cause tardive dyskinesia.  (When I have seen her in person I have never noticed any abnormal movements except the nystagmus.)  As far as her medications are concerned right now she is doing well so no changes will be made.  Continue Lamictal 200 mg, 1 po bid. Continue Lithium 300 mg daily. She's still slowly weaning.  Continue Ativan 0.5 mg, 1/2-1 po bid prn. She uses rarely.    Continue Ambien 5 mg, 1 p.o. nightly as needed sleep. Return in 6 months.  Appointment must be in person.  Melony Overly, PA-C

## 2023-02-13 ENCOUNTER — Other Ambulatory Visit: Payer: Self-pay | Admitting: Physician Assistant

## 2023-02-19 ENCOUNTER — Other Ambulatory Visit: Payer: Self-pay | Admitting: Physician Assistant

## 2023-02-22 ENCOUNTER — Other Ambulatory Visit: Payer: Self-pay

## 2023-02-22 NOTE — Telephone Encounter (Signed)
duplicate

## 2023-02-28 ENCOUNTER — Other Ambulatory Visit: Payer: Self-pay

## 2023-02-28 ENCOUNTER — Other Ambulatory Visit: Payer: Self-pay | Admitting: Psychiatry

## 2023-02-28 MED ORDER — ZOLPIDEM TARTRATE 5 MG PO TABS: 5.0000 mg | ORAL_TABLET | Freq: Every evening | ORAL | 2 refills | Status: DC | PRN

## 2023-03-01 ENCOUNTER — Other Ambulatory Visit: Payer: Self-pay

## 2023-03-01 MED ORDER — LORAZEPAM 0.5 MG PO TABS: ORAL_TABLET | ORAL | 0 refills | Status: DC

## 2023-03-01 NOTE — Telephone Encounter (Signed)
 Pt called at 2:06p.  She is requesting refill on Ativan  (script failed to be sent on 12/27).  Per Dawna, the script fails to go through electronically for some reason.  She has pended it to Dr Geoffry.  She confirmed with the pharmacy while I was on the phone with her that they do not have it.   This is at [facility].  (They have confirmed receipt of the Zolpidem .)  She is also requesting refill of Lithium  SOLUTION to  [facility] - [place], [place] - [address] [place] [address] Phone: [phone]  Fax: [phone]   Next appt 4/7

## 2023-03-01 NOTE — Telephone Encounter (Signed)
 Fyi: This rX is not showing up on PDMP and she is also on Fentanyl and oxycodone.

## 2023-03-01 NOTE — Telephone Encounter (Signed)
 Noted.

## 2023-03-12 ENCOUNTER — Telehealth: Payer: Self-pay | Admitting: Physician Assistant

## 2023-03-12 MED ORDER — LITHIUM CITRATE 300 MG/5 ML PO SYRP: ORAL | 1 refills | Status: DC

## 2023-03-12 NOTE — Telephone Encounter (Signed)
 Pt called stating that she is out and desperately needs the lithium citrate 300 mg/5 ml. She needs it sent to the [facility] on [address]. Please call her at [phone]

## 2023-03-12 NOTE — Telephone Encounter (Signed)
 Lithium solution Rx sent to [facility].

## 2023-03-13 ENCOUNTER — Other Ambulatory Visit: Payer: Self-pay

## 2023-03-13 ENCOUNTER — Telehealth: Payer: Self-pay | Admitting: Psychiatry

## 2023-03-13 NOTE — Telephone Encounter (Signed)
 [facility] pharmacy called. They are saying that the lithium  that was called in was not the same formulation that was Rxed previosly. Can we send in the previous formulation?

## 2023-03-13 NOTE — Telephone Encounter (Signed)
 Romell Cluster, pharmacist at [facility], verbal to compound the lithium  using carbonate. Rx said citrate, which is what is showing in multiple RF in med history, but she said they needed to use carbonate, as they have in the past.

## 2023-04-02 ENCOUNTER — Ambulatory Visit: Payer: Medicare PPO | Admitting: Dermatology

## 2023-04-19 ENCOUNTER — Other Ambulatory Visit: Payer: Self-pay | Admitting: Physician Assistant

## 2023-04-19 ENCOUNTER — Other Ambulatory Visit: Payer: Self-pay

## 2023-04-19 MED ORDER — LITHIUM CARBONATE 300 MG PO TABS: 300.0000 mg | ORAL_TABLET | Freq: Every day | ORAL | 0 refills | Status: DC

## 2023-04-19 NOTE — Telephone Encounter (Signed)
Pt called at 1:07p.  She is requesting refill of Lithium 300mg  TABLETS.  She takes 225mg  a day so she can't do capsules.  She thought she had another bottle, but she doesn't and the pharmacy closes at 6pm.  Pls send to   [facility] - [name], [place] - [address] [place], [place] [address] Phone: [phone]  Fax: [phone]    Next appt 4/7

## 2023-04-22 ENCOUNTER — Other Ambulatory Visit: Payer: Self-pay | Admitting: Psychiatry

## 2023-04-23 ENCOUNTER — Other Ambulatory Visit: Payer: Self-pay

## 2023-04-23 MED ORDER — BELSOMRA 20 MG PO TABS: 1.0000 | ORAL_TABLET | Freq: Every day | ORAL | 0 refills | Status: DC

## 2023-04-23 MED ORDER — AMPHETAMINE-DEXTROAMPHETAMINE 20 MG PO TABS: 20.0000 mg | ORAL_TABLET | Freq: Every day | ORAL | 0 refills | Status: DC

## 2023-05-29 ENCOUNTER — Other Ambulatory Visit: Payer: Self-pay | Admitting: Psychiatry

## 2023-05-30 ENCOUNTER — Other Ambulatory Visit: Payer: Self-pay

## 2023-05-30 MED ORDER — AMPHETAMINE-DEXTROAMPHETAMINE 20 MG PO TABS: 20.0000 mg | ORAL_TABLET | Freq: Every day | ORAL | 0 refills | Status: DC

## 2023-06-03 ENCOUNTER — Encounter: Payer: Self-pay | Admitting: Physician Assistant

## 2023-06-03 ENCOUNTER — Ambulatory Visit: Payer: Medicare PPO | Admitting: Physician Assistant

## 2023-06-03 DIAGNOSIS — F319 Bipolar disorder, unspecified: Secondary | ICD-10-CM

## 2023-06-03 DIAGNOSIS — F4310 Post-traumatic stress disorder, unspecified: Secondary | ICD-10-CM

## 2023-06-03 DIAGNOSIS — G4700 Insomnia, unspecified: Secondary | ICD-10-CM

## 2023-06-03 MED ORDER — LORAZEPAM 0.5 MG PO TABS: ORAL_TABLET | ORAL | 0 refills | Status: DC

## 2023-06-03 NOTE — Progress Notes (Signed)
 [facility]  Patient ID: Katherine James,  MRN: [id number]  PCP: Angelyn Punt  Date of Evaluation: 06/03/2023 Time spent: 23 minutes  Chief Complaint:  Chief Complaint   Anxiety; Depression    HISTORY/CURRENT STATUS: HPI For routine med check.  She feels like her mental health medications are working well.  Patient is able to enjoy things.  Energy and motivation are good. No extreme sadness, tearfulness, or feelings of hopelessness.  Sleeps well most of the time. ADLs and personal hygiene are normal.   Denies any changes in concentration, making decisions, or remembering things.  The Adderall is effective.  XR was changed to IR, she was thinking it may be a part of the reason for the nystagmus but she has not noticed any difference.  She still sees ophthalmologist for that.  Appetite has not changed.  Weight is stable.  She still gets anxious sometimes and needs the Ativan.  She does not take it very often though.  It is more of getting overwhelmed rather than having full-blown panic attacks.  Denies suicidal or homicidal thoughts.  Patient denies increased energy with decreased need for sleep, increased talkativeness, racing thoughts, impulsivity or risky behaviors, increased spending, increased libido, grandiosity, increased irritability or anger, paranoia, or hallucinations.  Denies dizziness, syncope, seizures, numbness, tingling, tics, unsteady gait, slurred speech, confusion.  Has chronic muscle and joint pain secondary to fibromyalgia and arthritis.  She is still seeing pain management.  Still has issues with vision, no change. See old notes.  Individual Medical History/ Review of Systems: Changes? :No      Past medications for mental health diagnoses include: Seroquel, Lamictal, lithium, Provigil, Nuvigil, Adderall, mirtazapine, Celexa, Depakote, Wellbutrin, Prozac, Paxil, desipramine, Austedo caused rash, had 2 treatments of Spravato with Dr. Evelene Croon  Allergies: Shauna Hugh  [deutetrabenazine], Alprazolam, Amoxicillin-pot clavulanate, Aspirin, Butalbital-aspirin-caffeine, Celecoxib, Chlorpromazine hcl, Clonidine, Codeine, Depakote [divalproex sodium], Desipramine, Diclofenac sodium, Doxycycline, Etodolac, Fluoxetine hcl, Fluticasone propionate, Ibuprofen, Influenza vaccines, Iodine, Ketorolac tromethamine, Levofloxacin, Meperidine hcl, Nabumetone, Naproxen, Neurontin [gabapentin], Nsaids, Olive oil, Oxaprozin, Paroxetine, Phenergan [promethazine hcl], Piroxicam, Pneumococcal vaccines, Rimantadine, Rofecoxib, Skelaxin [metaxalone], Sulfamethoxazole-trimethoprim, Sulfasalazine, Sulindac, Symbicort [budesonide-formoterol fumarate], Terfenadine, Tetracycline, Tramadol hcl, Wellbutrin [bupropion], Zinc, Ginkgo biloba, and Lisinopril  Current Medications:  Current Outpatient Medications:    amphetamine-dextroamphetamine (ADDERALL) 20 MG tablet, Take 1 tablet (20 mg total) by mouth daily., Disp: 30 tablet, Rfl: 0   carisoprodol (SOMA) 350 MG tablet, Take 3 tablets daily, Disp: , Rfl:    fentaNYL (DURAGESIC) 12 MCG/HR, 1 patch every 3 (three) days., Disp: , Rfl:    lamoTRIgine (LAMICTAL) 200 MG tablet, TAKE 2 TABLETS BY MOUTH DAILY, Disp: 180 tablet, Rfl: 1   levothyroxine (SYNTHROID) 50 MCG tablet, Take 50 mcg by mouth daily before breakfast., Disp: , Rfl:    lithium citrate 300 MG/5ML solution, Use as directed (she decreases 25 mg each month in addition to the Li 300 mg) current dose is a total of 700 mg daily (Patient taking differently: Use as directed (she decreases 25 mg each month in addition to the Li 300 mg) current dose is a total of 225 mg), Disp: 120 mL, Rfl: 1   oxyCODONE (OXY IR/ROXICODONE) 5 MG immediate release tablet, Take 10 mg daily as needed by mouth for severe pain or breakthrough pain. , Disp: , Rfl:    progesterone (PROMETRIUM) 200 MG capsule, Take 200 mg by mouth daily., Disp: , Rfl:    Suvorexant (BELSOMRA) 20 MG TABS, TAKE 1 TABLET BY MOUTH AT BEDTIME AS  NEEDED, Disp: 30 tablet, Rfl: 1   Suvorexant (BELSOMRA) 20 MG TABS, Take 1 tablet (20 mg total) by mouth at bedtime., Disp: 30 tablet, Rfl: 0   UNABLE TO FIND, DHEA SR (compound)- take 17 mg (1 tablet) daily except Saturday and Sunday, Disp: , Rfl:    UNABLE TO FIND, Hormone Topical Cream (Compound): Bi-Est (8:2) 1 mg, apply 1 gram every morning, Disp: , Rfl:    UNABLE TO FIND, Nature's Thryoid- take 3 g M, W and F, and 2 g every other day, Disp: , Rfl:    UNABLE TO FIND, Progesterone SR 300 mg (compound), take 1 tablet daily at bedtime, Disp: , Rfl:    UNABLE TO FIND, Pregnenolone 90 mg (compound), take 1 tablet daily, Disp: , Rfl:    UNABLE TO FIND, Testosterone Cream 0.5%- 1g/71mL, Disp: , Rfl:    albuterol (VENTOLIN HFA) 108 (90 Base) MCG/ACT inhaler, , Disp: , Rfl:    lithium 300 MG tablet, Take 1 tablet (300 mg total) by mouth at bedtime. (Patient not taking: Reported on 06/03/2023), Disp: 90 tablet, Rfl: 0   lithium carbonate 300 MG capsule, TAKE ONE CAPSULE BY MOUTH AT BEDTIME. (Patient not taking: Reported on 06/03/2023), Disp: 90 capsule, Rfl: 1   LORazepam (ATIVAN) 0.5 MG tablet, TAKE 1/2 TO 1 TABLET BY MOUTH TWICE A DAY AS NEEDED FOR ANXIETY, Disp: 30 tablet, Rfl: 0   ondansetron (ZOFRAN) 4 MG tablet, TAKE 1 AND 1/2 TABLETS BY MOUTH DAILY FOR NAUSEA (Patient not taking: Reported on 06/03/2023), Disp: 45 tablet, Rfl: 0 Medication Side Effects: none  Family Medical/ Social History: Changes? No  MENTAL HEALTH EXAM:  There were no vitals taken for this visit.There is no height or weight on file to calculate BMI.  General Appearance: Casual and Well Groomed  Eye Contact:  Good  Speech:  Clear and Coherent and Normal Rate  Volume:  Normal  Mood:  Euthymic  Affect:  Congruent  Thought Process:  Goal Directed and Descriptions of Associations: Circumstantial  Orientation:  Full (Time, Place, and Person)  Thought Content: Logical   Suicidal Thoughts:  No  Homicidal Thoughts:  No  Memory:    Fair to good depending on the day.  Judgement:  Good  Insight:  Good  Psychomotor Activity:  Normal   Concentration:  Concentration: Good and Attention Span: Fair  Recall:  Good  Fund of Knowledge: Good  Language: Good  Assets:  Communication Skills Desire for Improvement Financial Resources/Insurance Housing Transportation  ADL's:  Intact  Cognition: WNL  Prognosis:  Good   Gene site test results are scanned in chart under labs 02/22/2021.  Had reduced folic acid conversion.  Neuropsychological testing results in chart 02/28/2021. Assessment summary was intact neurocognitive abilities.  Premorbid intellectual functioning high average.  Intact performance of attention, processing speed, language, visual construction, memory, executive function, relative strength was observed on task of attention and speeded word generation.  Diagnosis and conclusions were multiple concussions, MDD, cluster B personality features, rule out borderline personality disorder.  DBT was recommended.  Per Dr. Elinor Parkinson [facility] health care  Labs  04/29/2023 CBC with differential essentially normal CMP BUN 15, creatinine 0.85 all other values are normal except alkaline phosphatase 138.  DIAGNOSES:    ICD-10-CM   1. Bipolar I disorder (HCC)  F31.9     2. Insomnia, unspecified type  G47.00     3. PTSD (post-traumatic stress disorder)  F43.10      Receiving Psychotherapy: No  RECOMMENDATIONS:  PDMP was reviewed.  Adderall filled for 06/16/2023.  Oxycodone filled 05/20/2023.  Fentanyl filled 05/20/2023.  Belsomra filled 05/13/2023.  Soma filled 05/06/2023. I provided 23 minutes of face to face time during this encounter, including time spent before and after the visit in records review, medical decision making, counseling pertinent to today's visit, and charting.   She is still doing well with her mental health medications so no changes will be made.  Continue Adderall 20 mg 1 p.o. every morning. Continue  Lamictal 200 mg, 1 po bid. Continue Lithium 225 mg daily. She's still slowly weaning.  Continue Ativan 0.5 mg, 1/2-1 po bid prn. She uses rarely.    Continue Belsomra 20 mg, 1 p.o. nightly. Return in 6 months.     Melony Overly, PA-C

## 2023-06-04 ENCOUNTER — Other Ambulatory Visit: Payer: Self-pay

## 2023-06-04 MED ORDER — LORAZEPAM 0.5 MG PO TABS: ORAL_TABLET | ORAL | 0 refills | Status: DC

## 2023-06-27 ENCOUNTER — Other Ambulatory Visit: Payer: Self-pay | Admitting: Psychiatry

## 2023-06-28 ENCOUNTER — Other Ambulatory Visit: Payer: Self-pay

## 2023-06-28 NOTE — Telephone Encounter (Signed)
 Pt called at 4:32p requesting refill of Adderall to   [facility] - [place], [place] - [address], [place] [address] Phone: [phone]  Fax: [phone]   Pt says they close at 6 on Friday, close at 12 on Sat and aren't open on Sunday.  Next appt 10/6

## 2023-07-03 ENCOUNTER — Telehealth: Payer: Self-pay

## 2023-07-03 DIAGNOSIS — F900 Attention-deficit hyperactivity disorder, predominantly inattentive type: Secondary | ICD-10-CM

## 2023-07-03 MED ORDER — AMPHETAMINE-DEXTROAMPHETAMINE 20 MG PO TABS
20.0000 mg | ORAL_TABLET | Freq: Every day | ORAL | 0 refills | Status: AC
Start: 2023-07-03 — End: ?

## 2023-07-03 NOTE — Telephone Encounter (Signed)
 Rx sent but transmission failure. Has already been sent to Dr. Toi Foster to re-approve.

## 2023-07-03 NOTE — Telephone Encounter (Signed)
 Patient called in for refill for Adderall 20mg . States that pharmacy didn't receive prescription and needs to be resent. Ph: [phone]  Appt 10/6 Pharmacy [facility] [address] [place], [place]

## 2023-07-31 ENCOUNTER — Other Ambulatory Visit: Payer: Self-pay | Admitting: Psychiatry

## 2023-07-31 MED ORDER — BELSOMRA 20 MG PO TABS: 1.0000 | ORAL_TABLET | Freq: Every evening | ORAL | 4 refills | Status: DC | PRN

## 2023-07-31 NOTE — Telephone Encounter (Signed)
 Pt called back at 3:30 stating the Belsomra  script needs to be sent in before 5 so they can order it to have it by tomorrow.  They close at 6p.  She said it "slipped up on her" that she didn't have any more.  [facility] is the confirmed pharmacy.

## 2023-08-01 ENCOUNTER — Other Ambulatory Visit: Payer: Self-pay | Admitting: Psychiatry

## 2023-08-01 DIAGNOSIS — F900 Attention-deficit hyperactivity disorder, predominantly inattentive type: Secondary | ICD-10-CM

## 2023-08-01 MED ORDER — LORAZEPAM 0.5 MG PO TABS: ORAL_TABLET | ORAL | 0 refills | Status: DC

## 2023-08-01 MED ORDER — BELSOMRA 20 MG PO TABS: 1.0000 | ORAL_TABLET | Freq: Every evening | ORAL | 0 refills | Status: AC | PRN

## 2023-08-08 ENCOUNTER — Other Ambulatory Visit: Payer: Self-pay | Admitting: Psychiatry

## 2023-08-08 DIAGNOSIS — F900 Attention-deficit hyperactivity disorder, predominantly inattentive type: Secondary | ICD-10-CM

## 2023-08-13 ENCOUNTER — Ambulatory Visit: Payer: Medicare PPO | Admitting: Dermatology

## 2023-08-14 ENCOUNTER — Other Ambulatory Visit: Payer: Self-pay | Admitting: Physician Assistant

## 2023-08-23 ENCOUNTER — Other Ambulatory Visit: Payer: Self-pay | Admitting: Psychiatry

## 2023-09-06 ENCOUNTER — Other Ambulatory Visit: Payer: Self-pay | Admitting: Physician Assistant

## 2023-09-06 NOTE — Telephone Encounter (Signed)
 Pt only wants a quantity of 30

## 2023-09-09 NOTE — Telephone Encounter (Signed)
 LVM to [facility]

## 2023-09-20 ENCOUNTER — Other Ambulatory Visit: Payer: Self-pay

## 2023-09-20 MED ORDER — LITHIUM CITRATE 300 MG/5 ML PO SYRP: ORAL | 0 refills | Status: AC

## 2023-09-24 ENCOUNTER — Encounter: Payer: Self-pay | Admitting: Dermatology

## 2023-09-24 ENCOUNTER — Ambulatory Visit: Admitting: Dermatology

## 2023-09-24 DIAGNOSIS — L729 Follicular cyst of the skin and subcutaneous tissue, unspecified: Secondary | ICD-10-CM

## 2023-09-24 DIAGNOSIS — D1801 Hemangioma of skin and subcutaneous tissue: Secondary | ICD-10-CM

## 2023-09-24 DIAGNOSIS — I8290 Acute embolism and thrombosis of unspecified vein: Secondary | ICD-10-CM | POA: Diagnosis not present

## 2023-09-24 DIAGNOSIS — L578 Other skin changes due to chronic exposure to nonionizing radiation: Secondary | ICD-10-CM

## 2023-09-24 DIAGNOSIS — Z1283 Encounter for screening for malignant neoplasm of skin: Secondary | ICD-10-CM | POA: Diagnosis not present

## 2023-09-24 DIAGNOSIS — L720 Epidermal cyst: Secondary | ICD-10-CM

## 2023-09-24 DIAGNOSIS — L820 Inflamed seborrheic keratosis: Secondary | ICD-10-CM | POA: Diagnosis not present

## 2023-09-24 DIAGNOSIS — D2121 Benign neoplasm of connective and other soft tissue of right lower limb, including hip: Secondary | ICD-10-CM

## 2023-09-24 DIAGNOSIS — L738 Other specified follicular disorders: Secondary | ICD-10-CM

## 2023-09-24 DIAGNOSIS — L814 Other melanin hyperpigmentation: Secondary | ICD-10-CM

## 2023-09-24 DIAGNOSIS — Z86018 Personal history of other benign neoplasm: Secondary | ICD-10-CM

## 2023-09-24 DIAGNOSIS — L821 Other seborrheic keratosis: Secondary | ICD-10-CM

## 2023-09-24 DIAGNOSIS — Z85828 Personal history of other malignant neoplasm of skin: Secondary | ICD-10-CM

## 2023-09-24 DIAGNOSIS — D492 Neoplasm of unspecified behavior of bone, soft tissue, and skin: Secondary | ICD-10-CM

## 2023-09-24 DIAGNOSIS — W908XXA Exposure to other nonionizing radiation, initial encounter: Secondary | ICD-10-CM

## 2023-09-24 DIAGNOSIS — D229 Melanocytic nevi, unspecified: Secondary | ICD-10-CM

## 2023-09-24 DIAGNOSIS — L309 Dermatitis, unspecified: Secondary | ICD-10-CM

## 2023-09-24 NOTE — Progress Notes (Signed)
 Follow-Up Visit   Subjective  Katherine James is a 63 y.o. female who presents for the following: Skin Cancer Screening and Full Body Skin Exam. Hx of BCC. Hx of dysplastic nevi.   Areas of concern on left cheek. Raised, growing. States it is larger than it was when biopsied 03/01/2020. Dx: benign mole. Prefers referral to plastic surgeon if needs removal.   Spot on left knee. Getting larger. Getting darker. Blood mole  Bump on back of neck, right side. Gets larger then smaller. Thinks could be an ingrown hair.  Check left lateral mid back C/O area that burns. Had pain patch there  Hx of cellulitis on back of right thigh secondary to spider bite. Has rough area there. Burns, irritated.   The patient presents for Total-Body Skin Exam (TBSE) for skin cancer screening and mole check. The patient has spots, moles and lesions to be evaluated, some may be new or changing and the patient may have concern these could be cancer.    The following portions of the chart were reviewed this encounter and updated as appropriate: medications, allergies, medical history  Review of Systems:  No other skin or systemic complaints except as noted in HPI or Assessment and Plan.  Objective  Well appearing patient in no apparent distress; mood and affect are within normal limits.  A full examination was performed including scalp, head, eyes, ears, nose, lips, neck, chest, axillae, abdomen, back, buttocks, bilateral upper extremities, bilateral lower extremities, hands, feet, fingers, toes, fingernails, and toenails. All findings within normal limits unless otherwise noted below.   Relevant physical exam findings are noted in the Assessment and Plan.  Right Lower Thigh - Posterior x1 Erythematous keratotic or waxy stuck-on papule or plaque. Left Distal Thigh - Anterior 4 mm violaceous papule         Assessment & Plan   SKIN CANCER SCREENING PERFORMED TODAY.  HISTORY OF BASAL CELL CARCINOMA OF  THE SKIN.  - No evidence of recurrence today - Recommend regular full body skin exams - Recommend daily broad spectrum sunscreen SPF 30+ to sun-exposed areas, reapply every 2 hours as needed.  - Call if any new or changing lesions are noted between office visits  HISTORY OF DYSPLASTIC NEVI No evidence of recurrence today Recommend regular full body skin exams Recommend daily broad spectrum sunscreen SPF 30+ to sun-exposed areas, reapply every 2 hours as needed.  Call if any new or changing lesions are noted between office visits   ACTINIC DAMAGE - Chronic condition, secondary to cumulative UV/sun exposure - diffuse scaly erythematous macules with underlying dyspigmentation - Recommend daily broad spectrum sunscreen SPF 30+ to sun-exposed areas, reapply every 2 hours as needed.  - Staying in the shade or wearing long sleeves, sun glasses (UVA+UVB protection) and wide brim hats (4-inch brim around the entire circumference of the hat) are also recommended for sun protection.  - Call for new or changing lesions.  LENTIGINES, SEBORRHEIC KERATOSES, HEMANGIOMAS - Benign normal skin lesions - Benign-appearing - Call for any changes  MELANOCYTIC NEVI - Tan-brown and/or pink-flesh-colored symmetric macules and papules - 5 mm flesh papule at left mid cheek. Photo taken today. - 4 mm med brown thin papule at right posterior shoulder. - 5 mm thin brown papule at left hand dorsum. - 4 mm brown macule at right upper knee. - 3 mm pink flesh papule at right upper eyebrow. - flesh papule at right preauricular.  - Benign appearing on exam today - Observation - Call  clinic for new or changing moles - Recommend daily use of broad spectrum spf 30+ sunscreen to sun-exposed areas.    Recurrent Dermal Nevus, Bx proven  Exam: slightly light gray tan firm macule at left zygoma, 4 mm  Treatment: Recommend observation. Can return for biopsy if continues to get larger. Plastic surgeon would consider  excision a cosmetic procedure since lesion is biopsy proven benign nevus.    Dermatitis, possibly secondary to irritation from pain patch  Exam: Small pink patch at left lateral mid back  Treatment Plan: Benign, observe.      EPIDERMAL INCLUSION CYST Exam: Subcutaneous papule at right posterior neck, right intermammary firm subcutaneous nodule.  Benign-appearing. Exam most consistent with an epidermal inclusion cyst. Discussed that a cyst is a benign growth that can grow over time and sometimes get irritated or inflamed. Recommend observation if it is not bothersome. Discussed option of surgical excision to remove it if it is growing, symptomatic, or other changes noted. Please call for new or changing lesions so they can be evaluated.   Fibroma vs Nevus Exam: 10 x 6 mm flesh colored papule at plantar right foot, present since childhood  Treatment:  Benign-appearing.  Observation.  Call clinic for new or changing lesions.  Recommend daily use of broad spectrum spf 30+ sunscreen to sun-exposed areas.    Sebaceous Hyperplasia - Small yellow papules with a central dell at forehead - Benign-appearing - Observe. Call for changes.  Discussed cosmetic procedure ED, noncovered.  $60 for 1st lesion and $15 for each additional lesion if done on the same day.  Maximum charge $350.  One touch-up treatment included no charge. Discussed risks of treatment including dyspigmentation, small scar, and/or recurrence. Recommend daily broad spectrum sunscreen SPF 30+/photoprotection to treated areas once healed.    INFLAMED SEBORRHEIC KERATOSIS Right Lower Thigh - Posterior x1 Symptomatic, irritating, patient would like treated. Destruction of lesion - Right Lower Thigh - Posterior x1  Destruction method: cryotherapy   Informed consent: discussed and consent obtained   Lesion destroyed using liquid nitrogen: Yes   Region frozen until ice ball extended beyond lesion: Yes   Outcome: patient  tolerated procedure well with no complications   Post-procedure details: wound care instructions given   Additional details:  Prior to procedure, discussed risks of blister formation, small wound, skin dyspigmentation, or rare scar following cryotherapy. Recommend Vaseline ointment to treated areas while healing.   NEOPLASM OF SKIN Left Distal Thigh - Anterior Epidermal / dermal shaving  Lesion diameter (cm):  0.4 Informed consent: discussed and consent obtained   Patient was prepped and draped in usual sterile fashion: Area prepped with alcohol. Anesthesia: the lesion was anesthetized in a standard fashion   Anesthetic:  1% lidocaine  w/ epinephrine 1-100,000 buffered w/ 8.4% NaHCO3 Instrument used: flexible razor blade   Hemostasis achieved with: pressure, aluminum chloride and electrodesiccation   Outcome: patient tolerated procedure well   Post-procedure details: wound care instructions given   Post-procedure details comment:  Ointment and small bandage applied  Specimen 1 - Surgical pathology Differential Diagnosis: Irritated hemangioma, R/O atypia  Check Margins: No Return in about 1 year (around 09/23/2024) for TBSE, HxDN, HxBCC.  I, Jill Parcell, CMA, am acting as scribe for Rexene Rattler, MD.   Documentation: I have reviewed the above documentation for accuracy and completeness, and I agree with the above.  Rexene Rattler, MD

## 2023-09-24 NOTE — Patient Instructions (Addendum)
 Cryotherapy Aftercare  Wash gently with soap and water everyday.   Apply Vaseline Jelly daily until healed.    Wound Care Instructions  Cleanse wound gently with soap and water once a day then pat dry with clean gauze. Apply a thin coat of Petrolatum (petroleum jelly, "Vaseline") over the wound (unless you have an allergy to this). We recommend that you use a new, sterile tube of Vaseline. Do not pick or remove scabs. Do not remove the yellow or white "healing tissue" from the base of the wound.  Cover the wound with fresh, clean, nonstick gauze and secure with paper tape. You may use Band-Aids in place of gauze and tape if the wound is small enough, but would recommend trimming much of the tape off as there is often too much. Sometimes Band-Aids can irritate the skin.  You should call the office for your biopsy report after 1 week if you have not already been contacted.  If you experience any problems, such as abnormal amounts of bleeding, swelling, significant bruising, significant pain, or evidence of infection, please call the office immediately.  FOR ADULT SURGERY PATIENTS: If you need something for pain relief you may take 1 extra strength Tylenol (acetaminophen) AND 2 Ibuprofen (200mg  each) together every 4 hours as needed for pain. (do not take these if you are allergic to them or if you have a reason you should not take them.) Typically, you may only need pain medication for 1 to 3 days.      Recommend daily broad spectrum sunscreen SPF 30+ to sun-exposed areas, reapply every 2 hours as needed. Call for new or changing lesions.  Staying in the shade or wearing long sleeves, sun glasses (UVA+UVB protection) and wide brim hats (4-inch brim around the entire circumference of the hat) are also recommended for sun protection.    Melanoma ABCDEs  Melanoma is the most dangerous type of skin cancer, and is the leading cause of death from skin disease.  You are more likely to develop  melanoma if you: Have light-colored skin, light-colored eyes, or red or blond hair Spend a lot of time in the sun Tan regularly, either outdoors or in a tanning bed Have had blistering sunburns, especially during childhood Have a close family member who has had a melanoma Have atypical moles or large birthmarks  Early detection of melanoma is key since treatment is typically straightforward and cure rates are extremely high if we catch it early.   The first sign of melanoma is often a change in a mole or a new dark spot.  The ABCDE system is a way of remembering the signs of melanoma.  A for asymmetry:  The two halves do not match. B for border:  The edges of the growth are irregular. C for color:  A mixture of colors are present instead of an even brown color. D for diameter:  Melanomas are usually (but not always) greater than 6mm - the size of a pencil eraser. E for evolution:  The spot keeps changing in size, shape, and color.  Please check your skin once per month between visits. You can use a small mirror in front and a large mirror behind you to keep an eye on the back side or your body.   If you see any new or changing lesions before your next follow-up, please call to schedule a visit.  Please continue daily skin protection including broad spectrum sunscreen SPF 30+ to sun-exposed areas, reapplying every 2 hours  as needed when you're outdoors.   Staying in the shade or wearing long sleeves, sun glasses (UVA+UVB protection) and wide brim hats (4-inch brim around the entire circumference of the hat) are also recommended for sun protection.     Due to recent changes in healthcare laws, you may see results of your pathology and/or laboratory studies on MyChart before the doctors have had a chance to review them. We understand that in some cases there may be results that are confusing or concerning to you. Please understand that not all results are received at the same time and often  the doctors may need to interpret multiple results in order to provide you with the best plan of care or course of treatment. Therefore, we ask that you please give [place] 2 business days to thoroughly review all your results before contacting the office for clarification. Should we see a critical lab result, you will be contacted sooner.   If You Need Anything After Your Visit  If you have any questions or concerns for your doctor, please call our main line at [phone] and press option 4 to reach your doctor's medical assistant. If no one answers, please leave a voicemail as directed and we will return your call as soon as possible. Messages left after 4 pm will be answered the following business day.   You may also send [place] a message via MyChart. We typically respond to MyChart messages within 1-2 business days.  For prescription refills, please ask your pharmacy to contact our office. Our fax number is [phone].  If you have an urgent issue when the clinic is closed that cannot wait until the next business day, you can page your doctor at the number below.    Please note that while we do our best to be available for urgent issues outside of office hours, we are not available 24/7.   If you have an urgent issue and are unable to reach [place], you may choose to seek medical care at your doctor's office, retail clinic, urgent care center, or emergency room.  If you have a medical emergency, please immediately call 911 or go to the emergency department.  Pager Numbers  - Dr. Gwen Pounds: [phone]  - Dr. Roseanne Reno: [phone]  - Dr. Katrinka Blazing: [phone]   In the event of inclement weather, please call our main line at [phone] for an update on the status of any delays or closures.  Dermatology Medication Tips: Please keep the boxes that topical medications come in in order to help keep track of the instructions about where and how to use these. Pharmacies typically print the medication  instructions only on the boxes and not directly on the medication tubes.   If your medication is too expensive, please contact our office at [phone] option 4 or send [place] a message through MyChart.   We are unable to tell what your co-pay for medications will be in advance as this is different depending on your insurance coverage. However, we may be able to find a substitute medication at lower cost or fill out paperwork to get insurance to cover a needed medication.   If a prior authorization is required to get your medication covered by your insurance company, please allow [place] 1-2 business days to complete this process.  Drug prices often vary depending on where the prescription is filled and some pharmacies may offer cheaper prices.  The website www.goodrx.com contains coupons for medications through different pharmacies. The prices here do not account  for what the cost may be with help from insurance (it may be cheaper with your insurance), but the website can give you the price if you did not use any insurance.  - You can print the associated coupon and take it with your prescription to the pharmacy.  - You may also stop by our office during regular business hours and pick up a GoodRx coupon card.  - If you need your prescription sent electronically to a different pharmacy, notify our office through [facility] or by phone at [phone] option 4.     Si Usted Necesita Algo Despus de Su Visita  Tambin puede enviarnos un mensaje a travs de Clinical cytogeneticist. Por lo general respondemos a los mensajes de MyChart en el transcurso de 1 a 2 das hbiles.  Para renovar recetas, por favor pida a su farmacia que se ponga en contacto con nuestra oficina. Annie Sable de fax es [place] [phone].  Si tiene un asunto urgente cuando la clnica est cerrada y que no puede esperar hasta el siguiente da hbil, puede llamar/localizar a su doctor(a) al nmero que aparece a continuacin.   Por  favor, tenga en cuenta que aunque hacemos todo lo posible para estar disponibles para asuntos urgentes fuera del horario de [place], no estamos disponibles las 24 horas del da, los 7 [address]  [address] de la [place].   Si tiene un problema urgente y no puede comunicarse con nosotros, puede optar por buscar atencin mdica  en el consultorio de su doctor(a), en una clnica privada, en un centro de atencin urgente o en una sala de emergencias.  Si tiene Engineer, drilling, por favor llame inmediatamente al 911 o vaya a la sala de emergencias.  Nmeros de bper  - Dr. Gwen Pounds: [phone]  - Dra. Roseanne Reno: [phone]  - Dr. Katrinka Blazing: [phone]   En caso de inclemencias del tiempo, por favor llame a Lacy Duverney principal al [phone] para una actualizacin sobre el [place] de cualquier retraso o cierre.  Consejos para la medicacin en dermatologa: Por favor, guarde las cajas en las que vienen los medicamentos de uso tpico para ayudarle a seguir las instrucciones sobre dnde y cmo usarlos. Las farmacias generalmente imprimen las instrucciones del medicamento slo en las cajas y no directamente en los tubos del [place].   Si su medicamento es muy caro, por favor, pngase en contacto con Rolm [place]ala llamando al [phone] y presione la opcin 4 o envenos un mensaje a travs de Clinical cytogeneticist.   No podemos decirle cul ser su copago por los medicamentos por adelantado ya que esto es diferente dependiendo de la cobertura de su seguro. Sin embargo, es posible que podamos encontrar un medicamento sustituto a Audiological scientist un formulario para que el seguro cubra el medicamento que se considera necesario.   Si se requiere una autorizacin previa para que su compaa de seguros [place] su medicamento, por favor permtanos de 1 a 2 das hbiles para completar [address]  Los precios de los medicamentos varan con frecuencia dependiendo del Environmental consultant de dnde se surte la receta y alguna farmacias  pueden ofrecer precios ms baratos.  El sitio web www.goodrx.com tiene cupones para medicamentos de Health and safety inspector. Los precios aqu no tienen en cuenta lo que podra costar con la ayuda del seguro (puede ser ms barato con su seguro), pero el sitio web puede darle el precio si no utiliz Tourist information centre manager.  - Puede imprimir el cupn correspondiente y llevarlo con su receta a la farmacia.  -  Tambin puede pasar por nuestra oficina durante el horario de atencin regular y Education officer, museum una tarjeta de cupones de GoodRx.  - Si necesita que su receta se enve electrnicamente a una farmacia diferente, informe a nuestra oficina a travs de MyChart de [place] o por telfono llamando al [phone] y presione la opcin 4.

## 2023-09-26 LAB — SURGICAL PATHOLOGY

## 2023-10-02 ENCOUNTER — Ambulatory Visit: Payer: Self-pay | Admitting: Dermatology

## 2023-10-02 ENCOUNTER — Other Ambulatory Visit: Payer: Self-pay | Admitting: Psychiatry

## 2023-10-02 NOTE — Telephone Encounter (Signed)
-----   Message from Rexene Rattler sent at 10/02/2023 12:28 PM EDT ----- 1. Skin, left distal thigh - anterior :       HEMANGIOMA WITH THROMBOSIS   Benign - please call patient ----- Message ----- From: Interface, Lab In [place] Sent: 09/26/2023   6:31 PM EDT To: Rexene Rattler, MD

## 2023-10-02 NOTE — Telephone Encounter (Signed)
 Left message advising patient biopsy was benign and to call with any questions.

## 2023-12-02 ENCOUNTER — Telehealth: Admitting: Physician Assistant

## 2023-12-02 ENCOUNTER — Other Ambulatory Visit: Payer: Self-pay | Admitting: Psychiatry

## 2023-12-05 ENCOUNTER — Ambulatory Visit: Admitting: Physician Assistant

## 2023-12-05 ENCOUNTER — Encounter: Payer: Self-pay | Admitting: Physician Assistant

## 2023-12-05 ENCOUNTER — Telehealth: Payer: Self-pay | Admitting: Physician Assistant

## 2023-12-05 DIAGNOSIS — G4700 Insomnia, unspecified: Secondary | ICD-10-CM | POA: Diagnosis not present

## 2023-12-05 DIAGNOSIS — F319 Bipolar disorder, unspecified: Secondary | ICD-10-CM | POA: Diagnosis not present

## 2023-12-05 DIAGNOSIS — F900 Attention-deficit hyperactivity disorder, predominantly inattentive type: Secondary | ICD-10-CM

## 2023-12-05 NOTE — Telephone Encounter (Signed)
 Last refill Lorazepam  0.5 mg #30 9/02 Last refill Adderall 20 mg #30 10/7   [facility] 1 refill remaining for her with Lorazepam , they will fill it for the next delivery.

## 2023-12-05 NOTE — Telephone Encounter (Signed)
 Pt forgot to tell Verneita that she does need Ativan  and does take as much Adderall

## 2023-12-05 NOTE — Progress Notes (Signed)
 [facility]  Patient ID: Katherine James,  MRN: [id number]  PCP: Joshua Range  Date of Evaluation: 12/05/2023 Time spent:25 minutes  Chief Complaint:  Chief Complaint   ADHD; Depression; Anxiety; Insomnia; Follow-up   Virtual Visit via Telehealth  I connected with patient by telephone, with their informed consent, and verified patient privacy and that I am speaking with the correct person using two identifiers.  I am private, in my office and the patient is at home.  I discussed the limitations, risks, security and privacy concerns of performing an evaluation and management service by telephone and the availability of in person appointments. I also discussed with the patient that there may be a patient responsible charge related to this service. The patient expressed understanding and agreed to proceed.   I discussed the assessment and treatment plan with the patient. The patient was provided an opportunity to ask questions and all were answered. The patient agreed with the plan and demonstrated an understanding of the instructions.   The patient was advised to call back or seek an in-person evaluation if the symptoms worsen or if the condition fails to improve as anticipated.  I provided approximately 25 minutes of non-face-to-face time during this encounter.  HISTORY/CURRENT STATUS: HPI For routine med check.  Feels like her meds are working as intended. Continues to gradually wean off Lithium . Still has probs with her eyes, has been to multiple specialists over the past several years, hoping that going off the Lucillie will resolve the nystagmus.   Patient is able to enjoy things.  Energy and motivation are good.  No extreme sadness, tearfulness, or feelings of hopelessness.  Sleeps ok.  ADLs and personal hygiene are normal.  States that attention is good without easy distractibility.  Able to focus on things and finish tasks to completion.  Appetite has not changed.  Weight is  stable.  Anxiety is controlled.  Ativan  helps and she tries not to take it near the time of Adderall.  No mania, delirium, AH/VH.  No SI/HI.  Individual Medical History/ Review of Systems: Changes? :No      Past medications for mental health diagnoses include: Seroquel, Lamictal , lithium , Provigil, Nuvigil, Adderall, mirtazapine, Celexa, Depakote, Wellbutrin, Prozac, Paxil, desipramine, Austedo caused rash, had 2 treatments of [place]rav[place] with Dr. Vincente  Allergies: Austedo [deutetrabenazine], Alprazolam, Amoxicillin -pot clavulanate, Aspirin, Butalbital-aspirin-caffeine, Celecoxib, Chlorpromazine hcl, Clonidine, Codeine, Depakote [divalproex sodium], Desipramine, Diclofenac sodium, Doxycycline, Etodolac, Fluoxetine hcl, Fluticasone propionate, Ibuprofen, Influenza vaccines, Iodine, Ketorolac  tromethamine , Levofloxacin, Meperidine hcl, Nabumetone, Naproxen, Neurontin [gabapentin], Nsaids, Olive oil, Oxaprozin, Paroxetine, Phenergan [promethazine hcl], Piroxicam, Pneumococcal vaccines, Rimantadine, Rofecoxib, Skelaxin [metaxalone], Sulfamethoxazole-trimethoprim, Sulfasalazine, Sulindac, Symbicort [budesonide-formoterol fumarate], Terfenadine, Tetracycline, Tramadol hcl, Wellbutrin [bupropion], Zinc, Ginkgo biloba, and Lisinopril  Current Medications:  Current Outpatient Medications:    amphetamine -dextroamphetamine  (ADDERALL) 20 MG tablet, TAKE 1 TABLET BY MOUTH DAILY, Disp: 30 tablet, Rfl: 0   amphetamine -dextroamphetamine  (ADDERALL) 20 MG tablet, TAKE 1 TABLET BY MOUTH DAILY, Disp: 30 tablet, Rfl: 0   amphetamine -dextroamphetamine  (ADDERALL) 20 MG tablet, TAKE 1 TABLET BY MOUTH DAILY, Disp: 30 tablet, Rfl: 0   amphetamine -dextroamphetamine  (ADDERALL) 20 MG tablet, TAKE 1 TABLET BY MOUTH DAILY, Disp: 30 tablet, Rfl: 0   carisoprodol (SOMA) 350 MG tablet, Take 3 tablets daily, Disp: , Rfl:    fentaNYL  (DURAGESIC ) 12 MCG/HR, 1 patch every 3 (three) days., Disp: , Rfl:    lamoTRIgine  (LAMICTAL ) 200 MG  tablet, TAKE 2 TABLETS BY MOUTH DAILY, Disp: 180 tablet, Rfl: 1   levothyroxine (SYNTHROID)  50 MCG tablet, Take 50 mcg by mouth daily before breakfast., Disp: , Rfl:    lithium  citrate 300 MG/5ML solution, Use as directed (she decreases 25 mg each month in addition to the Li 300 mg) current dose is a total of 700 mg daily (Patient taking differently: Take 48 mg by mouth daily.), Disp: 120 mL, Rfl: 0   LORazepam  (ATIVAN ) 0.5 MG tablet, TAKE 1/2 TO 1 TABLET BY MOUTH TWICE A DAY AS NEEDED FOR ANXIETY, Disp: 30 tablet, Rfl: 2   Suvorexant  (BELSOMRA ) 20 MG TABS, Take 1 tablet (20 mg total) by mouth at bedtime as needed., Disp: 30 tablet, Rfl: 0   albuterol (VENTOLIN HFA) 108 (90 Base) MCG/ACT inhaler, , Disp: , Rfl:    ondansetron  (ZOFRAN ) 4 MG tablet, TAKE 1 AND 1/2 TABLETS BY MOUTH DAILY FOR NAUSEA (Patient not taking: Reported on 06/03/2023), Disp: 45 tablet, Rfl: 0   oxyCODONE  (OXY IR/ROXICODONE ) 5 MG immediate release tablet, Take 10 mg daily as needed by mouth for severe pain or breakthrough pain. , Disp: , Rfl:    progesterone  (PROMETRIUM ) 200 MG capsule, Take 200 mg by mouth daily., Disp: , Rfl:    UNABLE TO FIND, DHEA SR (compound)- take 17 mg (1 tablet) daily except Saturday and Sunday, Disp: , Rfl:    UNABLE TO FIND, Hormone Topical Cream (Compound): Bi-Est (8:2) 1 mg, apply 1 gram every morning, Disp: , Rfl:    UNABLE TO FIND, Nature's Thryoid- take 3 g M, W and F, and 2 g every other day, Disp: , Rfl:    UNABLE TO FIND, Progesterone  SR 300 mg (compound), take 1 tablet daily at bedtime, Disp: , Rfl:    UNABLE TO FIND, Pregnenolone 90 mg (compound), take 1 tablet daily, Disp: , Rfl:    UNABLE TO FIND, Testosterone  Cream 0.5%- 1g/37mL, Disp: , Rfl:  Medication Side Effects: none  Family Medical/ Social History: Changes? No  MENTAL HEALTH EXAM:  There were no vitals taken for this visit.There is no height or weight on file to calculate BMI.  General Appearance: unable to assess  Eye Contact:   unable to assess  Speech:  Clear and Coherent and Normal Rate  Volume:  Normal  Mood:  Euthymic  Affect:  unable to assess  Thought Process:  Goal Directed and Descriptions of Associations: Circumstantial  Orientation:  Full (Time, Place, and Person)  Thought Content: Logical   Suicidal Thoughts:  No  Homicidal Thoughts:  No  Memory:  Fair to good depending on the day.  Judgement:  Good  Insight:  Good  Psychomotor Activity:  unable to assess   Concentration:  Concentration: Good and Attention Span: Fair  Recall:  Good  Fund of Knowledge: Good  Language: Good  Assets:  Communication Skills Desire for Improvement Financial Resources/Insurance Housing Transportation  ADL's:  Intact  Cognition: WNL  Prognosis:  Good   Gene site test results are scanned in chart under labs 02/22/2021.  Had reduced folic acid  conversion.  Neuropsychological testing results in chart 02/28/2021. Assessment summary was intact neurocognitive abilities.  Premorbid intellectual functioning high average.  Intact performance of attention, processing speed, language, visual construction, memory, executive function, relative strength was observed on task of attention and speeded word generation.  Diagnosis and conclusions were multiple concussions, MDD, cluster B personality features, rule out borderline personality disorder.  DBT was recommended.  Per Dr. Norleen Littles [facility]  DIAGNOSES:    ICD-10-CM   1. Bipolar I disorder (HCC)  F31.9  2. Attention deficit hyperactivity disorder (ADHD), predominantly inattentive type  F90.0     3. Insomnia, unspecified type  G47.00       Receiving Psychotherapy: No    RECOMMENDATIONS:  PDMP was reviewed.  Adderall filled 12/03/2023.  On Soma, fentanyl , oxycodone  and testosterone .  Ativan  filled 10/29/2023. I provided approximately  25 minutes of non-face-to-face time during this encounter, including time spent before and after the visit in records review,  medical decision making, counseling pertinent to today's visit, and charting.   She is stable as far as her mental health meds are concerned so no changes are needed.   Continue Adderall 20 mg 1 p.o. every morning. Continue Lamictal  200 mg, 1 po bid. Continue Lithium  48  mg daily. She's still slowly weaning.  Continue Ativan  0.5 mg, 1/2-1 po bid prn. She uses rarely.    Continue Belsomra  20 mg, 1 p.o. nightly. Return in 6 months.     Verneita Cooks, PA-C

## 2023-12-06 NOTE — Telephone Encounter (Signed)
 Noted

## 2024-01-02 ENCOUNTER — Emergency Department
Admission: EM | Admit: 2024-01-02 | Discharge: 2024-01-02 | Discharge status: Against Medical Advice | Attending: Emergency Medicine | Admitting: Emergency Medicine

## 2024-01-02 ENCOUNTER — Other Ambulatory Visit: Payer: Self-pay

## 2024-01-02 DIAGNOSIS — R112 Nausea with vomiting, unspecified: Secondary | ICD-10-CM | POA: Insufficient documentation

## 2024-01-02 DIAGNOSIS — R569 Unspecified convulsions: Secondary | ICD-10-CM | POA: Insufficient documentation

## 2024-01-02 DIAGNOSIS — Z5321 Procedure and treatment not carried out due to patient leaving prior to being seen by health care provider: Secondary | ICD-10-CM | POA: Insufficient documentation

## 2024-01-02 LAB — CBC WITH DIFFERENTIAL/PLATELET
Abs Immature Granulocytes: 0.05 K/uL (ref 0.00–0.07)
Basophils Absolute: 0 K/uL (ref 0.0–0.1)
Basophils Relative: 1 %
Eosinophils Absolute: 0 K/uL (ref 0.0–0.5)
Eosinophils Relative: 0 %
HCT: 37.6 % (ref 36.0–46.0)
Hemoglobin: 12.8 g/dL (ref 12.0–15.0)
Immature Granulocytes: 1 %
Lymphocytes Relative: 14 %
Lymphs Abs: 0.8 K/uL (ref 0.7–4.0)
MCH: 35.4 pg — ABNORMAL HIGH (ref 26.0–34.0)
MCHC: 34 g/dL (ref 30.0–36.0)
MCV: 103.9 fL — ABNORMAL HIGH (ref 80.0–100.0)
Monocytes Absolute: 0.3 K/uL (ref 0.1–1.0)
Monocytes Relative: 5 %
Neutro Abs: 4.5 K/uL (ref 1.7–7.7)
Neutrophils Relative %: 79 %
Platelets: 253 K/uL (ref 150–400)
RBC: 3.62 MIL/uL — ABNORMAL LOW (ref 3.87–5.11)
RDW: 12.6 % (ref 11.5–15.5)
WBC: 5.7 K/uL (ref 4.0–10.5)
nRBC: 0 % (ref 0.0–0.2)

## 2024-01-02 LAB — COMPREHENSIVE METABOLIC PANEL WITH GFR
ALT: 14 U/L (ref 0–44)
AST: 17 U/L (ref 15–41)
Albumin: 4.3 g/dL (ref 3.5–5.0)
Alkaline Phosphatase: 136 U/L — ABNORMAL HIGH (ref 38–126)
Anion gap: 12 (ref 5–15)
BUN: 14 mg/dL (ref 8–23)
CO2: 23 mmol/L (ref 22–32)
Calcium: 9.4 mg/dL (ref 8.9–10.3)
Chloride: 105 mmol/L (ref 98–111)
Creatinine, Ser: 0.69 mg/dL (ref 0.44–1.00)
GFR, Estimated: >60 mL/min (ref >60)
Glucose, Bld: 145 mg/dL — ABNORMAL HIGH (ref 70–99)
Potassium: 3.6 mmol/L (ref 3.5–5.1)
Sodium: 140 mmol/L (ref 135–145)
Total Bilirubin: 0.9 mg/dL (ref 0.0–1.2)
Total Protein: 7.1 g/dL (ref 6.5–8.1)

## 2024-01-02 NOTE — ED Triage Notes (Signed)
 Patient states she had a conscious seizure lasting approximately 30-45 minutes, since she has had nausea and vomiting.

## 2024-01-02 NOTE — ED Notes (Signed)
 Pt comes via EMS from home with c/o seizure. Pt has hx of this. Pt states her legs were quivering and giving out. Pt was not postical. Pt states N/V on way to hospital. Pt given 4 zofran  by ems.

## 2024-01-29 ENCOUNTER — Encounter: Payer: Self-pay | Admitting: Dermatology

## 2024-02-07 ENCOUNTER — Other Ambulatory Visit: Payer: Self-pay | Admitting: Psychiatry

## 2024-02-14 ENCOUNTER — Other Ambulatory Visit: Payer: Self-pay | Admitting: Psychiatry

## 2024-02-14 NOTE — Telephone Encounter (Signed)
 Disregard this request.  Pt said [facility] had the Adderall script, but didn't have the Ativan  script. They were both sent on the same day.  I contacted pharmacy and they went back and found the Ativan  script.  Everything is good now.

## 2024-02-24 ENCOUNTER — Ambulatory Visit: Admitting: Dermatology

## 2024-03-03 ENCOUNTER — Other Ambulatory Visit: Payer: Self-pay | Admitting: Physician Assistant

## 2024-06-04 ENCOUNTER — Ambulatory Visit: Admitting: Physician Assistant

## 2024-07-01 ENCOUNTER — Ambulatory Visit: Admitting: Physician Assistant
